# Patient Record
Sex: Female | Born: 1950 | Race: White | Hispanic: No | Smoking: Former smoker
Health system: Southern US, Community
[De-identification: ages and names within clinical notes are randomized; demographics above are authoritative.]

## PROBLEM LIST (undated history)

## (undated) DIAGNOSIS — H579 Unspecified disorder of eye and adnexa: Secondary | ICD-10-CM

## (undated) DIAGNOSIS — R011 Cardiac murmur, unspecified: Secondary | ICD-10-CM

## (undated) DIAGNOSIS — M2460 Ankylosis, unspecified joint: Secondary | ICD-10-CM

## (undated) DIAGNOSIS — C801 Malignant (primary) neoplasm, unspecified: Secondary | ICD-10-CM

## (undated) DIAGNOSIS — IMO0001 Reserved for inherently not codable concepts without codable children: Secondary | ICD-10-CM

## (undated) DIAGNOSIS — E669 Obesity, unspecified: Secondary | ICD-10-CM

## (undated) DIAGNOSIS — E119 Type 2 diabetes mellitus without complications: Secondary | ICD-10-CM

## (undated) DIAGNOSIS — L259 Unspecified contact dermatitis, unspecified cause: Secondary | ICD-10-CM

## (undated) DIAGNOSIS — Z794 Long term (current) use of insulin: Secondary | ICD-10-CM

## (undated) DIAGNOSIS — G709 Myoneural disorder, unspecified: Secondary | ICD-10-CM

## (undated) DIAGNOSIS — L0390 Cellulitis, unspecified: Secondary | ICD-10-CM

## (undated) DIAGNOSIS — I341 Nonrheumatic mitral (valve) prolapse: Secondary | ICD-10-CM

## (undated) DIAGNOSIS — E785 Hyperlipidemia, unspecified: Secondary | ICD-10-CM

## (undated) DIAGNOSIS — I10 Essential (primary) hypertension: Secondary | ICD-10-CM

## (undated) DIAGNOSIS — K122 Cellulitis and abscess of mouth: Secondary | ICD-10-CM

## (undated) DIAGNOSIS — N200 Calculus of kidney: Secondary | ICD-10-CM

## (undated) HISTORY — DX: Calculus of kidney: N20.0

## (undated) HISTORY — PX: OTHER SURGICAL HISTORY: SHX169

## (undated) HISTORY — DX: Obesity, unspecified: E66.9

## (undated) HISTORY — DX: Reserved for inherently not codable concepts without codable children: IMO0001

## (undated) HISTORY — DX: Cellulitis, unspecified: L03.90

## (undated) HISTORY — DX: Long term (current) use of insulin: Z79.4

## (undated) HISTORY — DX: Hyperlipidemia, unspecified: E78.5

## (undated) HISTORY — DX: Type 2 diabetes mellitus without complications: E11.9

## (undated) HISTORY — DX: Unspecified disorder of eye and adnexa: H57.9

## (undated) HISTORY — DX: Cellulitis and abscess of mouth: K12.2

## (undated) HISTORY — DX: Essential (primary) hypertension: I10

## (undated) HISTORY — PX: LITHOTRIPSY: SUR834

## (undated) HISTORY — PX: EYE SURGERY: SHX253

## (undated) HISTORY — DX: Ankylosis, unspecified joint: M24.60

## (undated) HISTORY — PX: TOOTH EXTRACTION: SUR596

## (undated) HISTORY — DX: Unspecified contact dermatitis, unspecified cause: L25.9

## (undated) HISTORY — PX: REFRACTIVE SURGERY: SHX103

---

## 1997-09-21 ENCOUNTER — Ambulatory Visit (HOSPITAL_COMMUNITY): Admission: RE | Admit: 1997-09-21 | Discharge: 1997-09-21 | Payer: Self-pay | Admitting: Orthopedic Surgery

## 1997-09-23 ENCOUNTER — Encounter: Admission: RE | Admit: 1997-09-23 | Discharge: 1997-12-22 | Payer: Self-pay | Admitting: Orthopedic Surgery

## 1998-02-03 ENCOUNTER — Encounter: Admission: RE | Admit: 1998-02-03 | Discharge: 1998-05-04 | Payer: Self-pay | Admitting: *Deleted

## 1998-12-02 ENCOUNTER — Other Ambulatory Visit: Admission: RE | Admit: 1998-12-02 | Discharge: 1998-12-02 | Payer: Self-pay | Admitting: Family Medicine

## 2001-03-05 ENCOUNTER — Encounter: Admission: RE | Admit: 2001-03-05 | Discharge: 2001-03-05 | Payer: Self-pay | Admitting: Family Medicine

## 2001-03-05 ENCOUNTER — Encounter: Payer: Self-pay | Admitting: Family Medicine

## 2009-10-27 ENCOUNTER — Emergency Department (HOSPITAL_COMMUNITY): Admission: EM | Admit: 2009-10-27 | Discharge: 2009-10-27 | Payer: Self-pay | Admitting: Emergency Medicine

## 2009-12-21 ENCOUNTER — Encounter
Admission: RE | Admit: 2009-12-21 | Discharge: 2010-02-09 | Payer: Self-pay | Source: Home / Self Care | Attending: Orthopedic Surgery | Admitting: Orthopedic Surgery

## 2010-05-20 LAB — GLUCOSE, CAPILLARY: Glucose-Capillary: 258 mg/dL — ABNORMAL HIGH (ref 70–99)

## 2010-07-06 ENCOUNTER — Encounter: Payer: Self-pay | Admitting: Physician Assistant

## 2010-07-06 DIAGNOSIS — I10 Essential (primary) hypertension: Secondary | ICD-10-CM | POA: Insufficient documentation

## 2010-07-06 DIAGNOSIS — E1165 Type 2 diabetes mellitus with hyperglycemia: Secondary | ICD-10-CM | POA: Insufficient documentation

## 2010-07-06 DIAGNOSIS — E1136 Type 2 diabetes mellitus with diabetic cataract: Secondary | ICD-10-CM | POA: Insufficient documentation

## 2010-07-06 DIAGNOSIS — E119 Type 2 diabetes mellitus without complications: Secondary | ICD-10-CM

## 2010-07-06 DIAGNOSIS — IMO0002 Reserved for concepts with insufficient information to code with codable children: Secondary | ICD-10-CM | POA: Insufficient documentation

## 2010-07-06 LAB — HM DEXA SCAN: HM Dexa Scan: NORMAL

## 2010-08-04 ENCOUNTER — Encounter: Payer: Self-pay | Admitting: Physician Assistant

## 2010-10-11 ENCOUNTER — Encounter (INDEPENDENT_AMBULATORY_CARE_PROVIDER_SITE_OTHER): Payer: BC Managed Care – PPO | Admitting: Ophthalmology

## 2010-10-11 DIAGNOSIS — D3130 Benign neoplasm of unspecified choroid: Secondary | ICD-10-CM

## 2010-10-11 DIAGNOSIS — E11359 Type 2 diabetes mellitus with proliferative diabetic retinopathy without macular edema: Secondary | ICD-10-CM

## 2010-10-11 DIAGNOSIS — E11311 Type 2 diabetes mellitus with unspecified diabetic retinopathy with macular edema: Secondary | ICD-10-CM

## 2010-10-11 DIAGNOSIS — H43819 Vitreous degeneration, unspecified eye: Secondary | ICD-10-CM

## 2010-10-31 ENCOUNTER — Encounter (INDEPENDENT_AMBULATORY_CARE_PROVIDER_SITE_OTHER): Payer: BC Managed Care – PPO | Admitting: Ophthalmology

## 2010-10-31 DIAGNOSIS — H2510 Age-related nuclear cataract, unspecified eye: Secondary | ICD-10-CM

## 2010-10-31 DIAGNOSIS — E11359 Type 2 diabetes mellitus with proliferative diabetic retinopathy without macular edema: Secondary | ICD-10-CM

## 2010-10-31 DIAGNOSIS — H43819 Vitreous degeneration, unspecified eye: Secondary | ICD-10-CM

## 2010-10-31 DIAGNOSIS — E11311 Type 2 diabetes mellitus with unspecified diabetic retinopathy with macular edema: Secondary | ICD-10-CM

## 2010-11-10 ENCOUNTER — Encounter (INDEPENDENT_AMBULATORY_CARE_PROVIDER_SITE_OTHER): Payer: BC Managed Care – PPO | Admitting: Ophthalmology

## 2010-11-10 DIAGNOSIS — H3581 Retinal edema: Secondary | ICD-10-CM

## 2010-11-15 ENCOUNTER — Encounter (INDEPENDENT_AMBULATORY_CARE_PROVIDER_SITE_OTHER): Payer: BC Managed Care – PPO | Admitting: Ophthalmology

## 2010-11-15 DIAGNOSIS — E11311 Type 2 diabetes mellitus with unspecified diabetic retinopathy with macular edema: Secondary | ICD-10-CM

## 2010-11-15 DIAGNOSIS — E11359 Type 2 diabetes mellitus with proliferative diabetic retinopathy without macular edema: Secondary | ICD-10-CM

## 2010-11-15 DIAGNOSIS — H43819 Vitreous degeneration, unspecified eye: Secondary | ICD-10-CM

## 2010-11-28 ENCOUNTER — Encounter (INDEPENDENT_AMBULATORY_CARE_PROVIDER_SITE_OTHER): Payer: BC Managed Care – PPO | Admitting: Ophthalmology

## 2010-11-28 DIAGNOSIS — E11359 Type 2 diabetes mellitus with proliferative diabetic retinopathy without macular edema: Secondary | ICD-10-CM

## 2010-11-28 DIAGNOSIS — E11311 Type 2 diabetes mellitus with unspecified diabetic retinopathy with macular edema: Secondary | ICD-10-CM

## 2010-11-28 DIAGNOSIS — H43819 Vitreous degeneration, unspecified eye: Secondary | ICD-10-CM

## 2010-12-20 ENCOUNTER — Encounter (INDEPENDENT_AMBULATORY_CARE_PROVIDER_SITE_OTHER): Payer: BC Managed Care – PPO | Admitting: Ophthalmology

## 2010-12-20 DIAGNOSIS — H43819 Vitreous degeneration, unspecified eye: Secondary | ICD-10-CM

## 2010-12-20 DIAGNOSIS — E1039 Type 1 diabetes mellitus with other diabetic ophthalmic complication: Secondary | ICD-10-CM

## 2010-12-20 DIAGNOSIS — E11359 Type 2 diabetes mellitus with proliferative diabetic retinopathy without macular edema: Secondary | ICD-10-CM

## 2010-12-20 DIAGNOSIS — E11311 Type 2 diabetes mellitus with unspecified diabetic retinopathy with macular edema: Secondary | ICD-10-CM

## 2010-12-26 ENCOUNTER — Encounter (INDEPENDENT_AMBULATORY_CARE_PROVIDER_SITE_OTHER): Payer: BC Managed Care – PPO | Admitting: Ophthalmology

## 2010-12-26 DIAGNOSIS — D3130 Benign neoplasm of unspecified choroid: Secondary | ICD-10-CM

## 2010-12-26 DIAGNOSIS — E11319 Type 2 diabetes mellitus with unspecified diabetic retinopathy without macular edema: Secondary | ICD-10-CM

## 2010-12-26 DIAGNOSIS — H43819 Vitreous degeneration, unspecified eye: Secondary | ICD-10-CM

## 2010-12-26 DIAGNOSIS — H3581 Retinal edema: Secondary | ICD-10-CM

## 2010-12-26 DIAGNOSIS — E1039 Type 1 diabetes mellitus with other diabetic ophthalmic complication: Secondary | ICD-10-CM

## 2011-01-20 ENCOUNTER — Encounter (INDEPENDENT_AMBULATORY_CARE_PROVIDER_SITE_OTHER): Payer: BC Managed Care – PPO | Admitting: Ophthalmology

## 2011-01-20 DIAGNOSIS — E11359 Type 2 diabetes mellitus with proliferative diabetic retinopathy without macular edema: Secondary | ICD-10-CM

## 2011-01-20 DIAGNOSIS — H43819 Vitreous degeneration, unspecified eye: Secondary | ICD-10-CM

## 2011-01-20 DIAGNOSIS — E1165 Type 2 diabetes mellitus with hyperglycemia: Secondary | ICD-10-CM

## 2011-01-20 DIAGNOSIS — E1139 Type 2 diabetes mellitus with other diabetic ophthalmic complication: Secondary | ICD-10-CM

## 2011-01-24 ENCOUNTER — Encounter (INDEPENDENT_AMBULATORY_CARE_PROVIDER_SITE_OTHER): Payer: BC Managed Care – PPO | Admitting: Ophthalmology

## 2011-01-24 DIAGNOSIS — I10 Essential (primary) hypertension: Secondary | ICD-10-CM

## 2011-01-24 DIAGNOSIS — E1139 Type 2 diabetes mellitus with other diabetic ophthalmic complication: Secondary | ICD-10-CM

## 2011-01-24 DIAGNOSIS — E11311 Type 2 diabetes mellitus with unspecified diabetic retinopathy with macular edema: Secondary | ICD-10-CM

## 2011-01-24 DIAGNOSIS — E11359 Type 2 diabetes mellitus with proliferative diabetic retinopathy without macular edema: Secondary | ICD-10-CM

## 2011-02-17 ENCOUNTER — Encounter (INDEPENDENT_AMBULATORY_CARE_PROVIDER_SITE_OTHER): Payer: BC Managed Care – PPO | Admitting: Ophthalmology

## 2011-02-22 ENCOUNTER — Encounter (INDEPENDENT_AMBULATORY_CARE_PROVIDER_SITE_OTHER): Payer: BC Managed Care – PPO | Admitting: Ophthalmology

## 2011-02-22 DIAGNOSIS — H2510 Age-related nuclear cataract, unspecified eye: Secondary | ICD-10-CM

## 2011-02-22 DIAGNOSIS — E11311 Type 2 diabetes mellitus with unspecified diabetic retinopathy with macular edema: Secondary | ICD-10-CM

## 2011-02-22 DIAGNOSIS — H43819 Vitreous degeneration, unspecified eye: Secondary | ICD-10-CM

## 2011-02-22 DIAGNOSIS — D3130 Benign neoplasm of unspecified choroid: Secondary | ICD-10-CM

## 2011-02-22 DIAGNOSIS — I10 Essential (primary) hypertension: Secondary | ICD-10-CM

## 2011-02-22 DIAGNOSIS — H35039 Hypertensive retinopathy, unspecified eye: Secondary | ICD-10-CM

## 2011-02-22 DIAGNOSIS — E1139 Type 2 diabetes mellitus with other diabetic ophthalmic complication: Secondary | ICD-10-CM

## 2011-02-22 DIAGNOSIS — E11359 Type 2 diabetes mellitus with proliferative diabetic retinopathy without macular edema: Secondary | ICD-10-CM

## 2011-03-13 ENCOUNTER — Encounter (INDEPENDENT_AMBULATORY_CARE_PROVIDER_SITE_OTHER): Payer: BC Managed Care – PPO | Admitting: Ophthalmology

## 2011-03-22 ENCOUNTER — Encounter (INDEPENDENT_AMBULATORY_CARE_PROVIDER_SITE_OTHER): Payer: BC Managed Care – PPO | Admitting: Ophthalmology

## 2011-04-03 ENCOUNTER — Encounter (INDEPENDENT_AMBULATORY_CARE_PROVIDER_SITE_OTHER): Payer: BC Managed Care – PPO | Admitting: Ophthalmology

## 2011-04-03 DIAGNOSIS — H2510 Age-related nuclear cataract, unspecified eye: Secondary | ICD-10-CM

## 2011-04-03 DIAGNOSIS — E1139 Type 2 diabetes mellitus with other diabetic ophthalmic complication: Secondary | ICD-10-CM

## 2011-04-03 DIAGNOSIS — I10 Essential (primary) hypertension: Secondary | ICD-10-CM

## 2011-04-03 DIAGNOSIS — E11359 Type 2 diabetes mellitus with proliferative diabetic retinopathy without macular edema: Secondary | ICD-10-CM

## 2011-04-03 DIAGNOSIS — H35039 Hypertensive retinopathy, unspecified eye: Secondary | ICD-10-CM

## 2011-04-03 DIAGNOSIS — H43819 Vitreous degeneration, unspecified eye: Secondary | ICD-10-CM

## 2011-04-03 DIAGNOSIS — E11311 Type 2 diabetes mellitus with unspecified diabetic retinopathy with macular edema: Secondary | ICD-10-CM

## 2011-04-03 DIAGNOSIS — E1165 Type 2 diabetes mellitus with hyperglycemia: Secondary | ICD-10-CM

## 2011-04-12 ENCOUNTER — Encounter (INDEPENDENT_AMBULATORY_CARE_PROVIDER_SITE_OTHER): Payer: BC Managed Care – PPO | Admitting: Ophthalmology

## 2011-04-12 DIAGNOSIS — E1139 Type 2 diabetes mellitus with other diabetic ophthalmic complication: Secondary | ICD-10-CM

## 2011-04-12 DIAGNOSIS — H35039 Hypertensive retinopathy, unspecified eye: Secondary | ICD-10-CM

## 2011-04-12 DIAGNOSIS — E11311 Type 2 diabetes mellitus with unspecified diabetic retinopathy with macular edema: Secondary | ICD-10-CM

## 2011-04-12 DIAGNOSIS — I10 Essential (primary) hypertension: Secondary | ICD-10-CM

## 2011-04-12 DIAGNOSIS — H2510 Age-related nuclear cataract, unspecified eye: Secondary | ICD-10-CM

## 2011-04-12 DIAGNOSIS — H43819 Vitreous degeneration, unspecified eye: Secondary | ICD-10-CM

## 2011-04-12 DIAGNOSIS — E11359 Type 2 diabetes mellitus with proliferative diabetic retinopathy without macular edema: Secondary | ICD-10-CM

## 2011-04-19 IMAGING — CR DG HUMERUS 2V *R*
2 series · 2 of 2 positions shown · non-contrast
Comparison: None

CLINICAL DATA: Fall

RIGHT HUMERUS - 2+ VIEW

[view not recorded (1 of 2)]
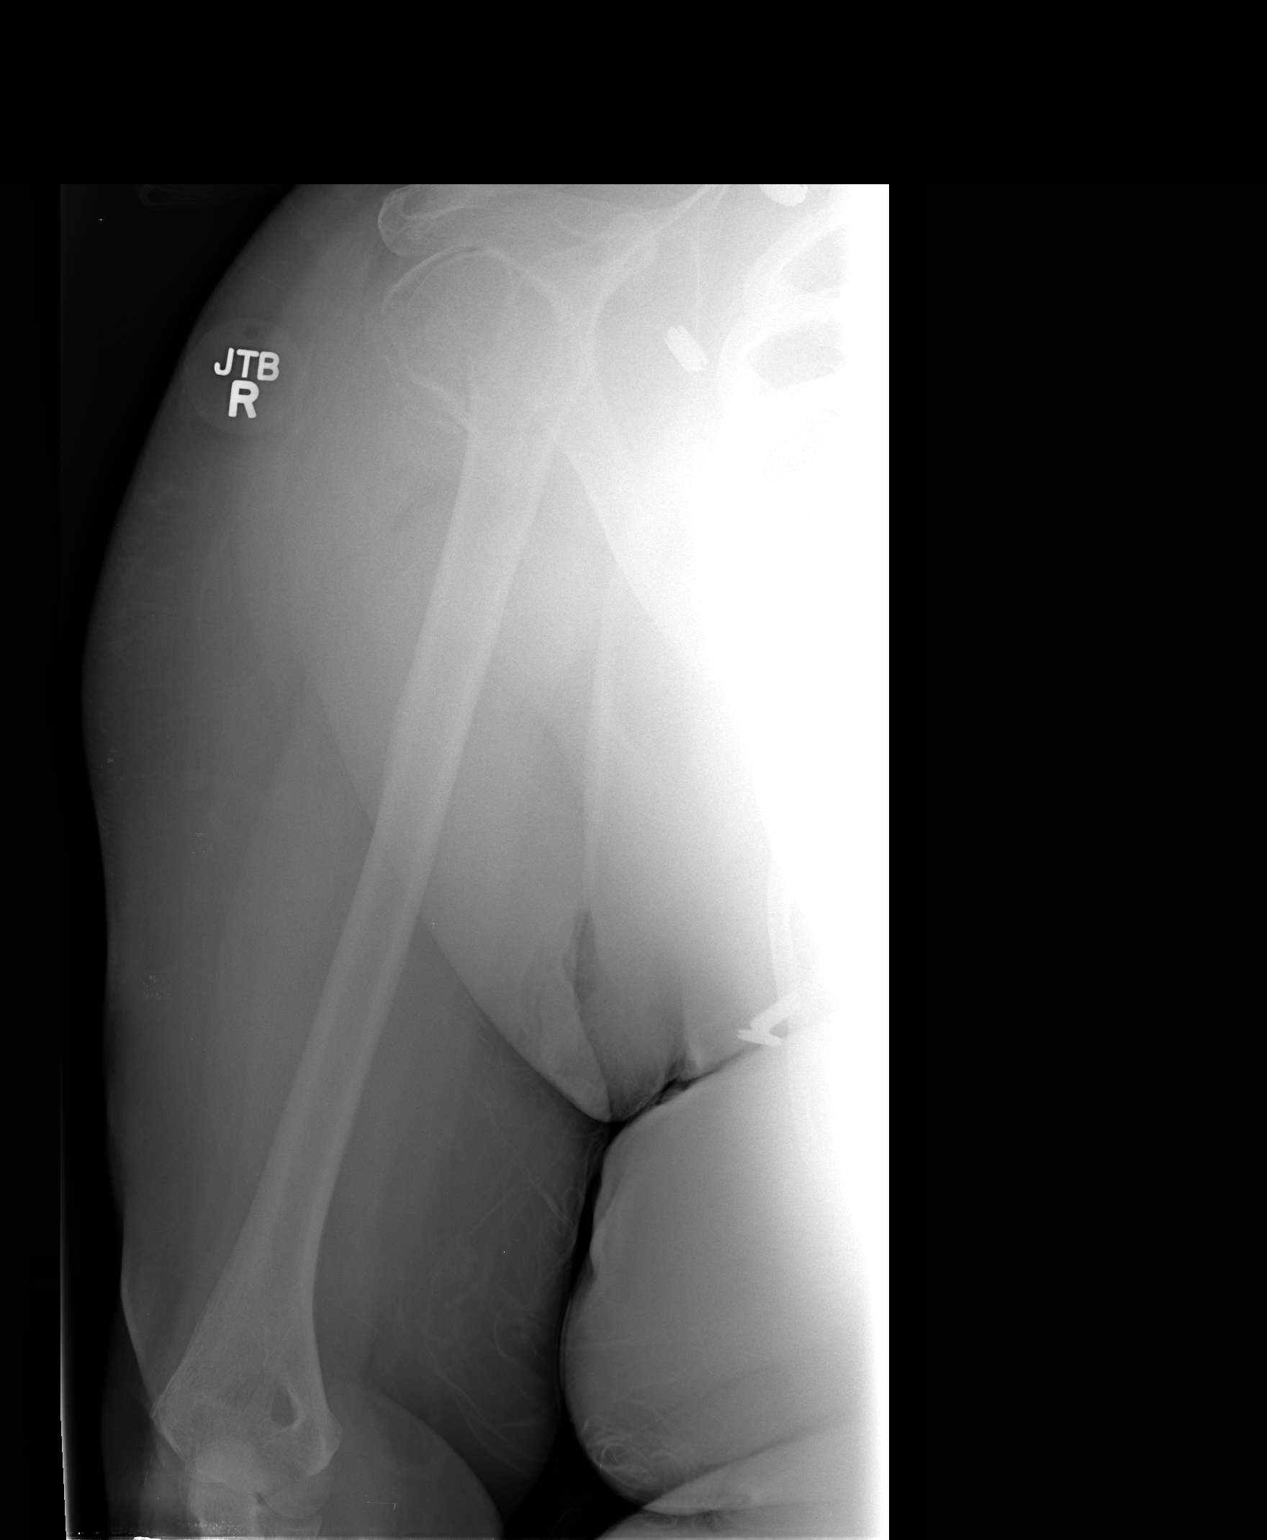

[view not recorded (2 of 2)]
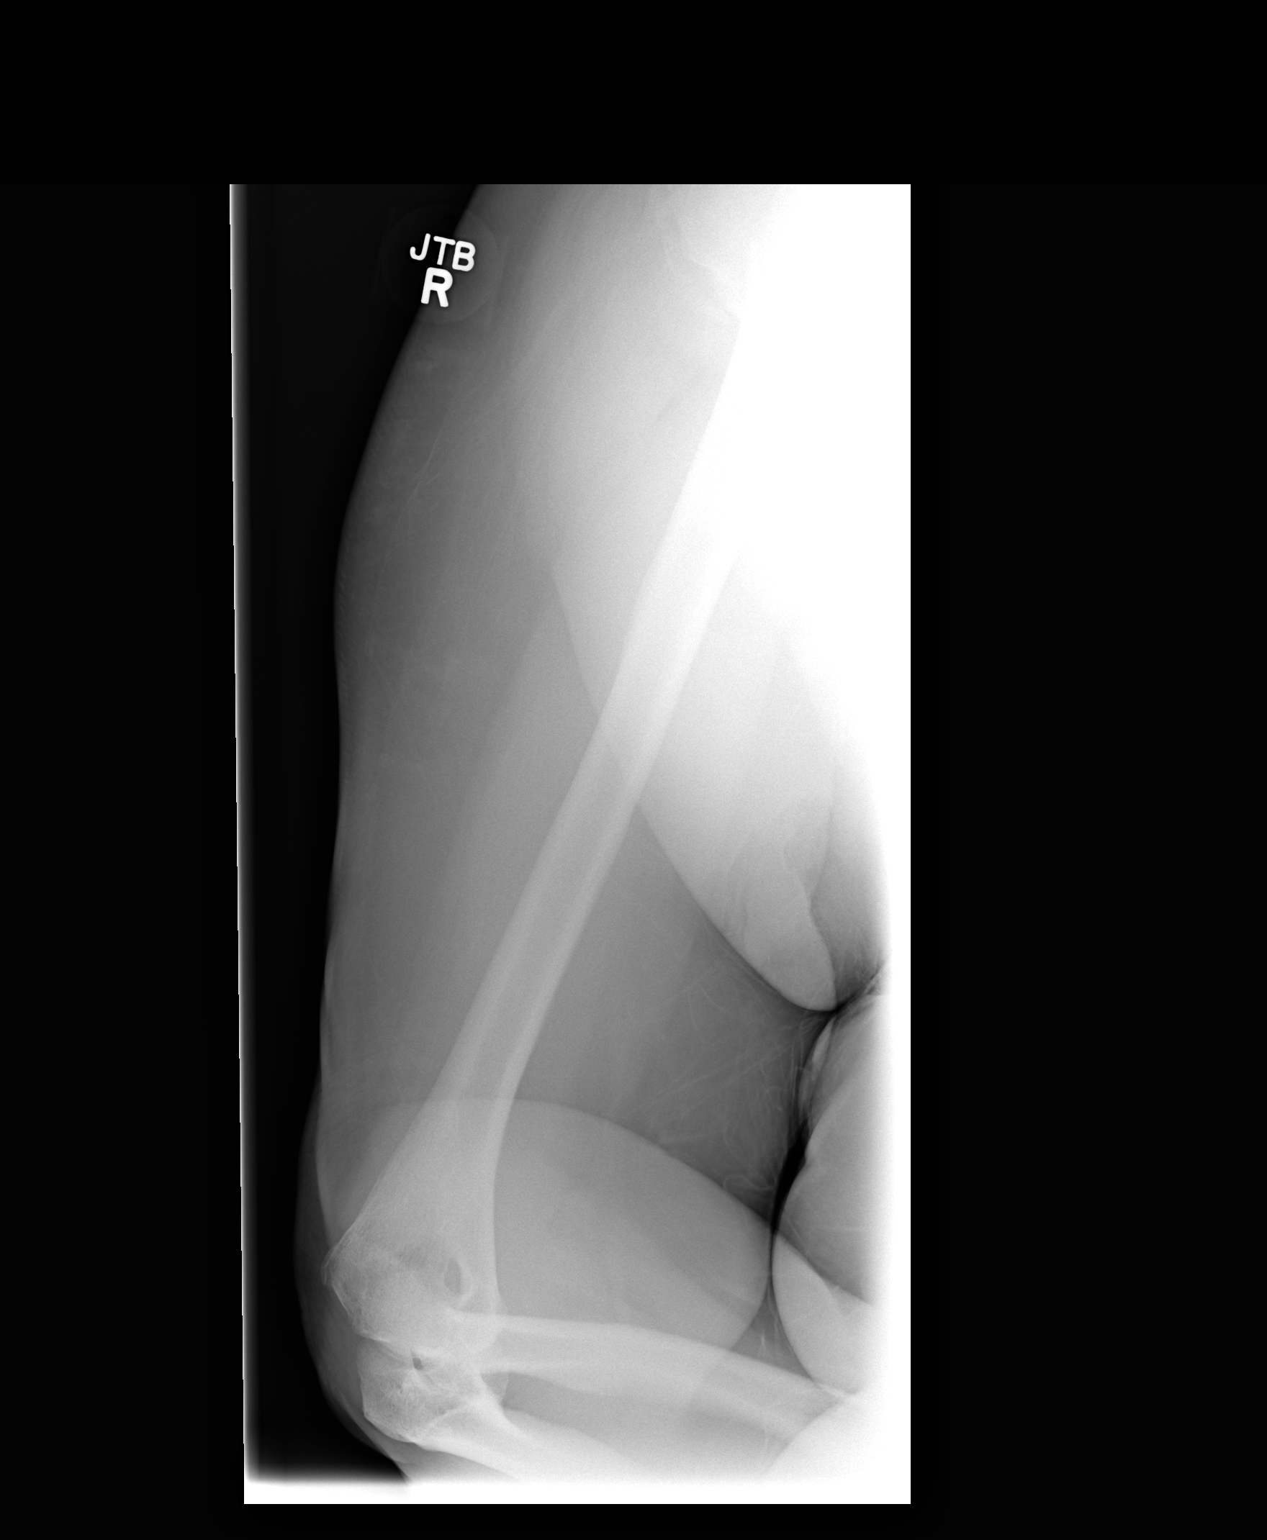

[2 of 2 positions shown; findings below may reference images not displayed]

FINDINGS: There is a fracture of the humeral neck with angulation
and mild displacement.  There is also a fracture of the humeral
head.  Limited views due to pain.  No dislocation.
IMPRESSION: Impacted and angulated fracture of the humeral neck.  There is also
fracture of the greater tuberosity of the humeral head.  Consider
CT for better anatomic delineation.

## 2011-05-01 ENCOUNTER — Encounter (INDEPENDENT_AMBULATORY_CARE_PROVIDER_SITE_OTHER): Payer: BC Managed Care – PPO | Admitting: Ophthalmology

## 2011-05-01 DIAGNOSIS — H35039 Hypertensive retinopathy, unspecified eye: Secondary | ICD-10-CM

## 2011-05-01 DIAGNOSIS — H43819 Vitreous degeneration, unspecified eye: Secondary | ICD-10-CM

## 2011-05-01 DIAGNOSIS — I10 Essential (primary) hypertension: Secondary | ICD-10-CM

## 2011-05-01 DIAGNOSIS — E1139 Type 2 diabetes mellitus with other diabetic ophthalmic complication: Secondary | ICD-10-CM

## 2011-05-01 DIAGNOSIS — E11311 Type 2 diabetes mellitus with unspecified diabetic retinopathy with macular edema: Secondary | ICD-10-CM

## 2011-05-01 DIAGNOSIS — E11359 Type 2 diabetes mellitus with proliferative diabetic retinopathy without macular edema: Secondary | ICD-10-CM

## 2011-05-10 ENCOUNTER — Encounter (INDEPENDENT_AMBULATORY_CARE_PROVIDER_SITE_OTHER): Payer: BC Managed Care – PPO | Admitting: Ophthalmology

## 2011-05-10 DIAGNOSIS — E1139 Type 2 diabetes mellitus with other diabetic ophthalmic complication: Secondary | ICD-10-CM

## 2011-05-10 DIAGNOSIS — H43819 Vitreous degeneration, unspecified eye: Secondary | ICD-10-CM

## 2011-05-10 DIAGNOSIS — D3130 Benign neoplasm of unspecified choroid: Secondary | ICD-10-CM

## 2011-05-10 DIAGNOSIS — E11311 Type 2 diabetes mellitus with unspecified diabetic retinopathy with macular edema: Secondary | ICD-10-CM

## 2011-05-10 DIAGNOSIS — E11359 Type 2 diabetes mellitus with proliferative diabetic retinopathy without macular edema: Secondary | ICD-10-CM

## 2011-05-10 DIAGNOSIS — I10 Essential (primary) hypertension: Secondary | ICD-10-CM

## 2011-05-10 DIAGNOSIS — H2510 Age-related nuclear cataract, unspecified eye: Secondary | ICD-10-CM

## 2011-05-10 DIAGNOSIS — H35039 Hypertensive retinopathy, unspecified eye: Secondary | ICD-10-CM

## 2011-05-29 ENCOUNTER — Encounter (INDEPENDENT_AMBULATORY_CARE_PROVIDER_SITE_OTHER): Payer: BC Managed Care – PPO | Admitting: Ophthalmology

## 2011-05-29 DIAGNOSIS — E1165 Type 2 diabetes mellitus with hyperglycemia: Secondary | ICD-10-CM

## 2011-05-29 DIAGNOSIS — E11311 Type 2 diabetes mellitus with unspecified diabetic retinopathy with macular edema: Secondary | ICD-10-CM

## 2011-05-29 DIAGNOSIS — I10 Essential (primary) hypertension: Secondary | ICD-10-CM

## 2011-05-29 DIAGNOSIS — E1139 Type 2 diabetes mellitus with other diabetic ophthalmic complication: Secondary | ICD-10-CM

## 2011-05-29 DIAGNOSIS — H2510 Age-related nuclear cataract, unspecified eye: Secondary | ICD-10-CM

## 2011-05-29 DIAGNOSIS — H35039 Hypertensive retinopathy, unspecified eye: Secondary | ICD-10-CM

## 2011-05-29 DIAGNOSIS — H43819 Vitreous degeneration, unspecified eye: Secondary | ICD-10-CM

## 2011-05-29 DIAGNOSIS — E11359 Type 2 diabetes mellitus with proliferative diabetic retinopathy without macular edema: Secondary | ICD-10-CM

## 2011-06-14 ENCOUNTER — Encounter (INDEPENDENT_AMBULATORY_CARE_PROVIDER_SITE_OTHER): Payer: BC Managed Care – PPO | Admitting: Ophthalmology

## 2011-06-14 DIAGNOSIS — H35039 Hypertensive retinopathy, unspecified eye: Secondary | ICD-10-CM

## 2011-06-14 DIAGNOSIS — E11311 Type 2 diabetes mellitus with unspecified diabetic retinopathy with macular edema: Secondary | ICD-10-CM

## 2011-06-14 DIAGNOSIS — H43819 Vitreous degeneration, unspecified eye: Secondary | ICD-10-CM

## 2011-06-14 DIAGNOSIS — E11359 Type 2 diabetes mellitus with proliferative diabetic retinopathy without macular edema: Secondary | ICD-10-CM

## 2011-06-14 DIAGNOSIS — E1139 Type 2 diabetes mellitus with other diabetic ophthalmic complication: Secondary | ICD-10-CM

## 2011-06-26 ENCOUNTER — Encounter (INDEPENDENT_AMBULATORY_CARE_PROVIDER_SITE_OTHER): Payer: BC Managed Care – PPO | Admitting: Ophthalmology

## 2011-06-30 ENCOUNTER — Encounter (INDEPENDENT_AMBULATORY_CARE_PROVIDER_SITE_OTHER): Payer: BC Managed Care – PPO | Admitting: Ophthalmology

## 2011-06-30 DIAGNOSIS — E1139 Type 2 diabetes mellitus with other diabetic ophthalmic complication: Secondary | ICD-10-CM

## 2011-06-30 DIAGNOSIS — H43819 Vitreous degeneration, unspecified eye: Secondary | ICD-10-CM

## 2011-06-30 DIAGNOSIS — E11311 Type 2 diabetes mellitus with unspecified diabetic retinopathy with macular edema: Secondary | ICD-10-CM

## 2011-06-30 DIAGNOSIS — E11359 Type 2 diabetes mellitus with proliferative diabetic retinopathy without macular edema: Secondary | ICD-10-CM

## 2011-06-30 DIAGNOSIS — I10 Essential (primary) hypertension: Secondary | ICD-10-CM

## 2011-06-30 DIAGNOSIS — E1165 Type 2 diabetes mellitus with hyperglycemia: Secondary | ICD-10-CM

## 2011-06-30 DIAGNOSIS — H35039 Hypertensive retinopathy, unspecified eye: Secondary | ICD-10-CM

## 2011-07-19 ENCOUNTER — Encounter (INDEPENDENT_AMBULATORY_CARE_PROVIDER_SITE_OTHER): Payer: BC Managed Care – PPO | Admitting: Ophthalmology

## 2011-07-19 DIAGNOSIS — E11311 Type 2 diabetes mellitus with unspecified diabetic retinopathy with macular edema: Secondary | ICD-10-CM

## 2011-07-19 DIAGNOSIS — E1139 Type 2 diabetes mellitus with other diabetic ophthalmic complication: Secondary | ICD-10-CM

## 2011-07-19 DIAGNOSIS — H2510 Age-related nuclear cataract, unspecified eye: Secondary | ICD-10-CM

## 2011-07-19 DIAGNOSIS — E11359 Type 2 diabetes mellitus with proliferative diabetic retinopathy without macular edema: Secondary | ICD-10-CM

## 2011-07-19 DIAGNOSIS — I10 Essential (primary) hypertension: Secondary | ICD-10-CM

## 2011-07-19 DIAGNOSIS — H35039 Hypertensive retinopathy, unspecified eye: Secondary | ICD-10-CM

## 2011-07-19 DIAGNOSIS — D3130 Benign neoplasm of unspecified choroid: Secondary | ICD-10-CM

## 2011-07-19 DIAGNOSIS — H43819 Vitreous degeneration, unspecified eye: Secondary | ICD-10-CM

## 2011-07-26 ENCOUNTER — Encounter (INDEPENDENT_AMBULATORY_CARE_PROVIDER_SITE_OTHER): Payer: BC Managed Care – PPO | Admitting: Ophthalmology

## 2011-07-26 DIAGNOSIS — H2510 Age-related nuclear cataract, unspecified eye: Secondary | ICD-10-CM

## 2011-07-26 DIAGNOSIS — H35039 Hypertensive retinopathy, unspecified eye: Secondary | ICD-10-CM

## 2011-07-26 DIAGNOSIS — E11311 Type 2 diabetes mellitus with unspecified diabetic retinopathy with macular edema: Secondary | ICD-10-CM

## 2011-07-26 DIAGNOSIS — E1139 Type 2 diabetes mellitus with other diabetic ophthalmic complication: Secondary | ICD-10-CM

## 2011-07-26 DIAGNOSIS — D3130 Benign neoplasm of unspecified choroid: Secondary | ICD-10-CM

## 2011-07-26 DIAGNOSIS — I10 Essential (primary) hypertension: Secondary | ICD-10-CM

## 2011-07-26 DIAGNOSIS — H43819 Vitreous degeneration, unspecified eye: Secondary | ICD-10-CM

## 2011-07-26 DIAGNOSIS — E11359 Type 2 diabetes mellitus with proliferative diabetic retinopathy without macular edema: Secondary | ICD-10-CM

## 2011-08-23 ENCOUNTER — Encounter (INDEPENDENT_AMBULATORY_CARE_PROVIDER_SITE_OTHER): Payer: BC Managed Care – PPO | Admitting: Ophthalmology

## 2011-08-30 ENCOUNTER — Encounter (INDEPENDENT_AMBULATORY_CARE_PROVIDER_SITE_OTHER): Payer: BC Managed Care – PPO | Admitting: Ophthalmology

## 2011-08-30 DIAGNOSIS — E1165 Type 2 diabetes mellitus with hyperglycemia: Secondary | ICD-10-CM

## 2011-08-30 DIAGNOSIS — E11311 Type 2 diabetes mellitus with unspecified diabetic retinopathy with macular edema: Secondary | ICD-10-CM

## 2011-08-30 DIAGNOSIS — E1139 Type 2 diabetes mellitus with other diabetic ophthalmic complication: Secondary | ICD-10-CM

## 2011-08-30 DIAGNOSIS — I10 Essential (primary) hypertension: Secondary | ICD-10-CM

## 2011-08-30 DIAGNOSIS — D3130 Benign neoplasm of unspecified choroid: Secondary | ICD-10-CM

## 2011-08-30 DIAGNOSIS — H43819 Vitreous degeneration, unspecified eye: Secondary | ICD-10-CM

## 2011-08-30 DIAGNOSIS — H35039 Hypertensive retinopathy, unspecified eye: Secondary | ICD-10-CM

## 2011-08-30 DIAGNOSIS — E11359 Type 2 diabetes mellitus with proliferative diabetic retinopathy without macular edema: Secondary | ICD-10-CM

## 2011-08-30 MED ORDER — LIDOCAINE HCL 2 % IJ SOLN
INTRAMUSCULAR | Status: AC
  Filled 2011-08-30: qty 1

## 2011-08-31 ENCOUNTER — Other Ambulatory Visit: Payer: Self-pay | Admitting: Obstetrics and Gynecology

## 2011-09-04 ENCOUNTER — Encounter (INDEPENDENT_AMBULATORY_CARE_PROVIDER_SITE_OTHER): Payer: BC Managed Care – PPO | Admitting: Ophthalmology

## 2011-09-04 DIAGNOSIS — E1139 Type 2 diabetes mellitus with other diabetic ophthalmic complication: Secondary | ICD-10-CM

## 2011-09-04 DIAGNOSIS — E1165 Type 2 diabetes mellitus with hyperglycemia: Secondary | ICD-10-CM

## 2011-09-04 DIAGNOSIS — I10 Essential (primary) hypertension: Secondary | ICD-10-CM

## 2011-09-04 DIAGNOSIS — H35039 Hypertensive retinopathy, unspecified eye: Secondary | ICD-10-CM

## 2011-09-04 DIAGNOSIS — E11311 Type 2 diabetes mellitus with unspecified diabetic retinopathy with macular edema: Secondary | ICD-10-CM

## 2011-09-04 DIAGNOSIS — H43819 Vitreous degeneration, unspecified eye: Secondary | ICD-10-CM

## 2011-09-04 DIAGNOSIS — H2510 Age-related nuclear cataract, unspecified eye: Secondary | ICD-10-CM

## 2011-09-04 DIAGNOSIS — D3130 Benign neoplasm of unspecified choroid: Secondary | ICD-10-CM

## 2011-09-04 DIAGNOSIS — E11359 Type 2 diabetes mellitus with proliferative diabetic retinopathy without macular edema: Secondary | ICD-10-CM

## 2011-09-10 ENCOUNTER — Encounter (HOSPITAL_COMMUNITY): Payer: Self-pay | Admitting: Pharmacist

## 2011-09-12 ENCOUNTER — Encounter (HOSPITAL_COMMUNITY): Payer: Self-pay

## 2011-09-12 ENCOUNTER — Encounter (HOSPITAL_COMMUNITY)
Admission: RE | Admit: 2011-09-12 | Discharge: 2011-09-12 | Disposition: A | Discharge status: Home / Self Care | Payer: BC Managed Care – PPO | Source: Ambulatory Visit | Attending: Obstetrics and Gynecology | Admitting: Obstetrics and Gynecology

## 2011-09-12 HISTORY — DX: Cardiac murmur, unspecified: R01.1

## 2011-09-12 HISTORY — DX: Myoneural disorder, unspecified: G70.9

## 2011-09-12 HISTORY — DX: Nonrheumatic mitral (valve) prolapse: I34.1

## 2011-09-12 HISTORY — DX: Malignant (primary) neoplasm, unspecified: C80.1

## 2011-09-12 LAB — CBC
HCT: 43.1 % (ref 36.0–46.0)
Hemoglobin: 14.2 g/dL (ref 12.0–15.0)
MCH: 26.9 pg (ref 26.0–34.0)
MCHC: 32.9 g/dL (ref 30.0–36.0)
MCV: 81.8 fL (ref 78.0–100.0)
Platelets: 217 10*3/uL (ref 150–400)
RBC: 5.27 MIL/uL — ABNORMAL HIGH (ref 3.87–5.11)
RDW: 13.6 % (ref 11.5–15.5)
WBC: 10.3 10*3/uL (ref 4.0–10.5)

## 2011-09-12 LAB — BASIC METABOLIC PANEL
BUN: 12 mg/dL (ref 6–23)
CO2: 29 mEq/L (ref 19–32)
Calcium: 9.2 mg/dL (ref 8.4–10.5)
Chloride: 96 mEq/L (ref 96–112)
Creatinine, Ser: 0.64 mg/dL (ref 0.50–1.10)
GFR calc Af Amer: >90 mL/min (ref >90)
GFR calc non Af Amer: >90 mL/min (ref >90)
Glucose, Bld: 242 mg/dL — ABNORMAL HIGH (ref 70–99)
Potassium: 4 mEq/L (ref 3.5–5.1)
Sodium: 134 mEq/L — ABNORMAL LOW (ref 135–145)

## 2011-09-12 NOTE — Pre-Procedure Instructions (Signed)
Dr Cristela Blue informed that patient's blood glucose level today was 242.  No orders given.

## 2011-09-12 NOTE — Patient Instructions (Signed)
   Your procedure is scheduled on: Wednesday, July 17th  Enter through the [employer] of [facility] at: 700am Pick up the phone at the desk and dial [phone] and inform us of your arrival.  Please call this number if you have any problems the morning of surgery: [phone]  Remember: Do not eat food after midnight: Tuesday Do not drink clear liquids after: Tuesday Take these medicines the morning of surgery with a SIP OF WATER:  Pt takes lisinipril at night - will take Tuesday night. Levemir - will take 1/2 50 units dose - 25 units Tuesday Night.  No Humalog dose on Wednesday Day of Surgery.  Do not wear jewelry, make-up, or FINGER nail polish No metal in your hair or on your body. Do not wear lotions, powders, perfumes or deodorant. Do not shave 48 hours prior to surgery. Do not bring valuables to the hospital. Contacts, dentures or bridgework may not be worn into surgery.  Patients discharged on the day of surgery will not be allowed to drive home.   Home with husband Greggory Stallion  cell [phone] or son Feliz Beam  cell [phone]   Remember to use your hibiclens as instructed.Please shower with 1/2 bottle the evening before your surgery and the other 1/2 bottle the morning of surgery. Neck down avoiding private area.

## 2011-09-14 ENCOUNTER — Other Ambulatory Visit: Payer: Self-pay | Admitting: Obstetrics and Gynecology

## 2011-09-14 DIAGNOSIS — R928 Other abnormal and inconclusive findings on diagnostic imaging of breast: Secondary | ICD-10-CM

## 2011-09-20 ENCOUNTER — Ambulatory Visit (HOSPITAL_COMMUNITY)
Admission: RE | Admit: 2011-09-20 | Discharge: 2011-09-20 | Disposition: A | Discharge status: Home / Self Care | Payer: BC Managed Care – PPO | Source: Ambulatory Visit | Attending: Obstetrics and Gynecology | Admitting: Obstetrics and Gynecology

## 2011-09-20 ENCOUNTER — Encounter (HOSPITAL_COMMUNITY): Payer: Self-pay | Admitting: Anesthesiology

## 2011-09-20 ENCOUNTER — Ambulatory Visit (HOSPITAL_COMMUNITY): Payer: BC Managed Care – PPO | Admitting: Anesthesiology

## 2011-09-20 ENCOUNTER — Encounter (HOSPITAL_COMMUNITY)
Admission: RE | Disposition: A | Discharge status: Home / Self Care | Payer: Self-pay | Source: Ambulatory Visit | Attending: Obstetrics and Gynecology

## 2011-09-20 DIAGNOSIS — Z01812 Encounter for preprocedural laboratory examination: Secondary | ICD-10-CM | POA: Insufficient documentation

## 2011-09-20 DIAGNOSIS — N950 Postmenopausal bleeding: Secondary | ICD-10-CM | POA: Insufficient documentation

## 2011-09-20 DIAGNOSIS — N840 Polyp of corpus uteri: Secondary | ICD-10-CM | POA: Insufficient documentation

## 2011-09-20 DIAGNOSIS — Z01818 Encounter for other preprocedural examination: Secondary | ICD-10-CM | POA: Insufficient documentation

## 2011-09-20 HISTORY — PX: HYSTEROSCOPY WITH D & C: SHX1775

## 2011-09-20 LAB — GLUCOSE, CAPILLARY: Glucose-Capillary: 268 mg/dL — ABNORMAL HIGH (ref 70–99)

## 2011-09-20 SURGERY — DILATATION AND CURETTAGE /HYSTEROSCOPY
Anesthesia: General | Site: Vagina | Wound class: Clean Contaminated

## 2011-09-20 MED ORDER — MIDAZOLAM HCL 2 MG/2ML IJ SOLN
INTRAMUSCULAR | Status: AC
  Filled 2011-09-20: qty 2

## 2011-09-20 MED ORDER — LIDOCAINE HCL (CARDIAC) 20 MG/ML IV SOLN
INTRAVENOUS | Status: AC
  Filled 2011-09-20: qty 5

## 2011-09-20 MED ORDER — PHENYLEPHRINE HCL 10 MG/ML IJ SOLN
INTRAMUSCULAR | Status: DC | PRN
  Administered 2011-09-20: 80 ug via INTRAVENOUS

## 2011-09-20 MED ORDER — PHENYLEPHRINE 40 MCG/ML (10ML) SYRINGE FOR IV PUSH (FOR BLOOD PRESSURE SUPPORT)
PREFILLED_SYRINGE | INTRAVENOUS | Status: AC
Start: 2011-09-20 — End: 2011-09-20
  Filled 2011-09-20: qty 5

## 2011-09-20 MED ORDER — INSULIN ASPART 100 UNIT/ML ~~LOC~~ SOLN
8.0000 [IU] | Freq: Once | SUBCUTANEOUS | Status: AC
  Administered 2011-09-20: 8 [IU] via SUBCUTANEOUS

## 2011-09-20 MED ORDER — ONDANSETRON HCL 4 MG/2ML IJ SOLN
INTRAMUSCULAR | Status: DC | PRN
  Administered 2011-09-20: 4 mg via INTRAVENOUS

## 2011-09-20 MED ORDER — INSULIN ASPART 100 UNIT/ML ~~LOC~~ SOLN
SUBCUTANEOUS | Status: AC
  Administered 2011-09-20: 8 [IU] via SUBCUTANEOUS
  Filled 2011-09-20: qty 1

## 2011-09-20 MED ORDER — FENTANYL CITRATE 0.05 MG/ML IJ SOLN
INTRAMUSCULAR | Status: AC
  Filled 2011-09-20: qty 2

## 2011-09-20 MED ORDER — FENTANYL CITRATE 0.05 MG/ML IJ SOLN
INTRAMUSCULAR | Status: DC | PRN
  Administered 2011-09-20 (×2): 50 ug via INTRAVENOUS

## 2011-09-20 MED ORDER — LACTATED RINGERS IV SOLN
INTRAVENOUS | Status: DC
  Administered 2011-09-20 (×2): via INTRAVENOUS

## 2011-09-20 MED ORDER — FENTANYL CITRATE 0.05 MG/ML IJ SOLN: 25.0000 ug | INTRAMUSCULAR | Status: DC | PRN

## 2011-09-20 MED ORDER — SILVER NITRATE-POT NITRATE 75-25 % EX MISC
CUTANEOUS | Status: AC
  Filled 2011-09-20: qty 2

## 2011-09-20 MED ORDER — ONDANSETRON HCL 4 MG/2ML IJ SOLN
INTRAMUSCULAR | Status: AC
Start: 2011-09-20 — End: 2011-09-20
  Filled 2011-09-20: qty 2

## 2011-09-20 MED ORDER — MIDAZOLAM HCL 5 MG/5ML IJ SOLN
INTRAMUSCULAR | Status: DC | PRN
  Administered 2011-09-20 (×4): 1 mg via INTRAVENOUS

## 2011-09-20 MED ORDER — GLYCINE 1.5 % IR SOLN
Status: DC | PRN
  Administered 2011-09-20: 3000 mL

## 2011-09-20 MED ORDER — LIDOCAINE-EPINEPHRINE (PF) 1 %-1:200000 IJ SOLN
INTRAMUSCULAR | Status: AC
  Filled 2011-09-20: qty 10

## 2011-09-20 MED ORDER — LIDOCAINE HCL 1 % IJ SOLN
INTRAMUSCULAR | Status: DC | PRN
  Administered 2011-09-20: 10 mL

## 2011-09-20 MED ORDER — ACETIC ACID 4% SOLUTION
Status: DC | PRN
  Administered 2011-09-20: 1 via TOPICAL

## 2011-09-20 MED ORDER — PROPOFOL 10 MG/ML IV EMUL
INTRAVENOUS | Status: AC
  Filled 2011-09-20: qty 20

## 2011-09-20 SURGICAL SUPPLY — 37 items
APPLICATOR COTTON TIP 6IN STRL (MISCELLANEOUS) ×3 IMPLANT
CANISTER SUCTION 2500CC (MISCELLANEOUS) ×3 IMPLANT
CATH ROBINSON RED A/P 16FR (CATHETERS) ×3 IMPLANT
CLOTH BEACON ORANGE TIMEOUT ST (SAFETY) ×3 IMPLANT
CONT SPECI 4OZ STER CLIK (MISCELLANEOUS) ×3 IMPLANT
CONTAINER PREFILL 10% NBF 60ML (FORM) ×6 IMPLANT
COUNTER NEEDLE 1200 MAGNETIC (NEEDLE) IMPLANT
ELECT BALL LEEP 5MM RED (ELECTRODE) IMPLANT
ELECT LOOP LEEP RND 15X12 GRN (CUTTING LOOP)
ELECT LOOP LEEP RND 20X12 WHT (CUTTING LOOP)
ELECT REM PT RETURN 9FT ADLT (ELECTROSURGICAL) ×3
ELECTRODE LOOP LP RND 15X12GRN (CUTTING LOOP) IMPLANT
ELECTRODE LOOP LP RND 20X12WHT (CUTTING LOOP) IMPLANT
ELECTRODE REM PT RTRN 9FT ADLT (ELECTROSURGICAL) ×2 IMPLANT
EVACUATOR PREFILTER SMOKE (MISCELLANEOUS) ×3 IMPLANT
GAUZE SPONGE 4X4 16PLY XRAY LF (GAUZE/BANDAGES/DRESSINGS) IMPLANT
GLOVE BIO SURGEON STRL SZ 6.5 (GLOVE) ×3 IMPLANT
GLOVE BIOGEL PI IND STRL 6.5 (GLOVE) ×4 IMPLANT
GLOVE BIOGEL PI INDICATOR 6.5 (GLOVE) ×2
GLOVE INDICATOR 6.5 STRL GRN (GLOVE) ×6 IMPLANT
GOWN PREVENTION PLUS LG XLONG (DISPOSABLE) ×6 IMPLANT
GOWN STRL REIN XL XLG (GOWN DISPOSABLE) ×3 IMPLANT
HOSE NS SMOKE EVAC 7/8 X6 (MISCELLANEOUS) ×3 IMPLANT
LOOP ANGLED CUTTING 22FR (CUTTING LOOP) IMPLANT
NEEDLE SPNL 22GX3.5 QUINCKE BK (NEEDLE) ×3 IMPLANT
NS IRRIG 1000ML POUR BTL (IV SOLUTION) ×3 IMPLANT
PACK HYSTEROSCOPY LF (CUSTOM PROCEDURE TRAY) ×3 IMPLANT
PACK VAGINAL MINOR WOMEN LF (CUSTOM PROCEDURE TRAY) ×3 IMPLANT
PENCIL BUTTON HOLSTER BLD 10FT (ELECTRODE) ×3 IMPLANT
REDUCER FITTING SMOKE EVAC (MISCELLANEOUS) ×3 IMPLANT
SCOPETTES 8  STERILE (MISCELLANEOUS) ×2
SCOPETTES 8 STERILE (MISCELLANEOUS) ×4 IMPLANT
SUT ETHILON 3 0 PS 1 18 (SUTURE) IMPLANT
SYR CONTROL 10ML LL (SYRINGE) ×3 IMPLANT
TOWEL OR 17X24 6PK STRL BLUE (TOWEL DISPOSABLE) ×6 IMPLANT
TUBING SMOKE EVAC HOSE ADAPTER (MISCELLANEOUS) ×3 IMPLANT
WATER STERILE IRR 1000ML POUR (IV SOLUTION) ×3 IMPLANT

## 2011-09-20 NOTE — H&P (Addendum)
61 y.o. presents with postmenopausal bleeding.  She has been PMP since 2005, but has had episodes of bleeding that have not been evaluated.  On office exam, lesion was noted on cervix, however Pap result was negative for abnormal cells and negative for HPV.  Pt elected to have colposcopy done in OR due to discomfort with office procedures.  Pt reports poor blood sugar control.  Increased risk of infection was discussed with patient as well as other risks of the procedure.  Past Medical History  Diagnosis Date  . Frozen joint     History of Right and Left Shoulder - no current problem 09/2011   . Hypertension   . IDDM (insulin dependent diabetes mellitus)   . Hyperlipidemia     no meds  . Contact dermatitis     History  . Obesity   . Cellulitis   . Oral cellulitis     History  . Eye problems     fluid in retina tx w/ avastin and lucentis drops, zymar abx drops   . Contact dermatitis   . Kidney stone     kidney stone - surgery required  . MVP (mitral valve prolapse)     abx tx with dental procedures  . Heart murmur     no problem  . Neuromuscular disorder     facial ride side decrease sensation  . Cancer     skin cancer hands/arms   Past Surgical History  Procedure Date  . Cesarean section 1987    x 1  . Lithotripsy     kidney stone  . Right side wisdom teeth ext     still has left wisdom teeth top/botom  . Tooth extraction     upper and lower back right  . Eye surgery     weak muscle eye surgery as child  . Refractive surgery     broken blood vessels behind eyes - bilaterally    History   Social History  . Marital Status: Married    Spouse Name: N/A    Number of Children: N/A  . Years of Education: N/A   Occupational History  . Not on file.   Social History Main Topics  . Smoking status: Never Smoker   . Smokeless tobacco: Never Used  . Alcohol Use: Yes     rarely - wine  . Drug Use: No  . Sexually Active: Yes    Birth Control/ Protection: None   Other  Topics Concern  . Not on file   Social History Narrative  . No narrative on file    No current facility-administered medications on file prior to encounter.   Current Outpatient Prescriptions on File Prior to Encounter  Medication Sig Dispense Refill  . fexofenadine (ALLEGRA) 180 MG tablet Take 180 mg by mouth daily as needed. For allergies      . insulin detemir (LEVEMIR) 100 UNIT/ML injection Inject 50 Units into the skin 2 (two) times daily.       . insulin lispro (HUMALOG) 100 UNIT/ML injection Inject 8-20 Units into the skin 3 (three) times daily before meals. Sliding scale: 70-110 0 units; 111-160 6 units; 161-220 12 units; 221-280 16 units; 281-340 20 units; 341-400 24 units; 401-460 28 units; 461-520 30 units      . lisinopril (PRINIVIL,ZESTRIL) 20 MG tablet Take 40 mg by mouth daily.         Allergies  Allergen Reactions  . Amlodipine Other (See Comments)    edema    @  [id number]@  Lungs: clear to ascultation Cor:  RRR Abdomen:  soft, nontender, nondistended. Ex:  no cords, erythema Pelvic:  Deferred to OR A:  AGUS on pap   P:  D&C hysteroscopy with colposcopy.   All risks, benefits and alternatives d/w patient and she desires to proceed with procedure.   Philip Aspen

## 2011-09-20 NOTE — Transfer of Care (Addendum)
Immediate Anesthesia Transfer of Care Note  Patient: Emma Griffin  Procedure(s) Performed: Procedure(s) (LRB): DILATATION AND CURETTAGE /HYSTEROSCOPY (N/A)  Patient Location: PACU  Anesthesia Type: MAC  Level of Consciousness: awake and sedated  Airway & Oxygen Therapy: Patient Spontanous Breathing  Post-op Assessment: Report given to PACU RN  Post vital signs: Reviewed and stable  Complications: No apparent anesthesia complications

## 2011-09-20 NOTE — Transfer of Care (Deleted)
Immediate Anesthesia Transfer of Care Note  Patient: Emma Griffin  Procedure(s) Performed: Procedure(s) (LRB): DILATATION AND CURETTAGE /HYSTEROSCOPY (N/A)  Patient Location: PACU  Anesthesia Type: Regional  Level of Consciousness: awake  Airway & Oxygen Therapy: Patient Spontanous Breathing  Post-op Assessment: Report given to PACU RN  Post vital signs: Reviewed and stable  Complications: No apparent anesthesia complications

## 2011-09-20 NOTE — Op Note (Signed)
Pre-op dx: postmenopausal bleeding, lesion on cervix on office exam Postop dx: postmenopausal bleeding, no visible cervical lesion Procedure: D&C, hysteroscopy, polypectomy Surgeon: Delray Alt Anesthesia: spinal with IV sedation, 10 cc 1% lidocaine pericervical block EBL: minimal UOP: 100cc clear cath'd preop Fluids: 1200cc Findings: uterus sounded to 9cm, abundant polypoid tissue, otherwise normal appearing uterine cavity Specimen: EMC, polypoid tissue Complications: none Condition: stable to PACU

## 2011-09-20 NOTE — Anesthesia Preprocedure Evaluation (Signed)
Anesthesia Evaluation  Patient identified by MRN, date of birth, ID band Patient awake    Reviewed: Allergy & Precautions, H&P , Patient's Chart, lab work & pertinent test results, reviewed documented beta blocker date and time   Airway Mallampati: IV TM Distance: >3 FB Neck ROM: full    Dental No notable dental hx. (+)    Pulmonary  breath sounds clear to auscultation  Pulmonary exam normal       Cardiovascular hypertension, On Medications Rhythm:regular Rate:Normal     Neuro/Psych    GI/Hepatic   Endo/Other  Poorly Controlled, Type obesity  Renal/GU      Musculoskeletal   Abdominal   Peds  Hematology   Anesthesia Other Findings Possible DI, plan spinal. Labs okay  Reproductive/Obstetrics                           Anesthesia Physical Anesthesia Plan  ASA: III  Anesthesia Plan: General and Spinal   Post-op Pain Management:    Induction: Intravenous  Airway Management Planned: LMA and Natural Airway  Additional Equipment:   Intra-op Plan:   Post-operative Plan:   Informed Consent: I have reviewed the patients History and Physical, chart, labs and discussed the procedure including the risks, benefits and alternatives for the proposed anesthesia with the patient or authorized representative who has indicated his/her understanding and acceptance.   Dental Advisory Given  Plan Discussed with: CRNA and Surgeon  Anesthesia Plan Comments: (  Discussed  Regional and general anesthesia, including possible nausea, instrumentation of airway, sore throat,pulmonary aspiration, etc. I asked if the were any outstanding questions, or  concerns before we proceeded.  Discussed spinal risk and HA,BA, failed block and Nerve injury. )        Anesthesia Quick Evaluation

## 2011-09-20 NOTE — Anesthesia Postprocedure Evaluation (Signed)
Anesthesia Post Note  Patient: Emma Griffin  Procedure(s) Performed: Procedure(s) (LRB): DILATATION AND CURETTAGE /HYSTEROSCOPY (N/A)  Anesthesia type: Spinal  Patient location: PACU  Post pain: Pain level controlled  Post assessment: Post-op Vital signs reviewed  Last Vitals:  Filed Vitals:   09/20/11 1300  BP:   Pulse: 81  Temp:   Resp: 16    Post vital signs: Reviewed  Level of consciousness: awake  Complications: No apparent anesthesia complications

## 2011-09-20 NOTE — Anesthesia Procedure Notes (Signed)
Spinal  Patient location during procedure: OR Preanesthetic Checklist Completed: patient identified, site marked, surgical consent, pre-op evaluation, timeout performed, IV checked, risks and benefits discussed and monitors and equipment checked Spinal Block Patient position: sitting Prep: DuraPrep Patient monitoring: heart rate, cardiac monitor, continuous pulse ox and blood pressure Approach: midline Location: L3-4 Injection technique: single-shot Needle Needle type: Sprotte  Needle gauge: 24 G Needle length: 9 cm Assessment Sensory level: T6 Additional Notes Spinal Dosage in OR  Bupivicaine ml       1.3   

## 2011-09-21 ENCOUNTER — Encounter (HOSPITAL_COMMUNITY): Payer: Self-pay | Admitting: Obstetrics and Gynecology

## 2011-09-21 LAB — GLUCOSE, CAPILLARY
Glucose-Capillary: 294 mg/dL — ABNORMAL HIGH (ref 70–99)
Glucose-Capillary: 226 mg/dL — ABNORMAL HIGH (ref 70–99)

## 2011-09-21 NOTE — Op Note (Signed)
Emma Griffin, Emma Griffin               ACCOUNT NO.:  [id number]  MEDICAL RECORD NO.:  [id number]  LOCATION:  [facility]                          FACILITY:  [facility]  PHYSICIAN:  Philip Aspen, DO    DATE OF BIRTH:  12-08-1950  DATE OF PROCEDURE:  09/20/2011 DATE OF DISCHARGE:  09/20/2011                              OPERATIVE REPORT   PREOPERATIVE DIAGNOSES:  Cervical lesion, postmenopausal bleeding.  POSTOPERATIVE DIAGNOSES:  Postmenopausal bleeding, normal-appearing cervix.  PROCEDURE:  D and C, hysteroscopy, colposcopy.  SURGEON:  Philip Aspen, DO.  URINE OUTPUT:  100 mL, clear urine, cath'd preoperatively.  FLUIDS:  1200 mL.  ESTIMATED BLOOD LOSS:  Minimal.  FINDINGS:  Uterus sounded to 9 cm.  Abundant polypoid tissue in the endometrial cavity.  Otherwise, normal-appearing uterine endometrium.  SPECIMENS:  Endometrial curettage.  COMPLICATIONS:  None.  CONDITION:  Stable to PACU.  DESCRIPTION OF PROCEDURE:  The patient was taken to the operating room, where epidural anesthesia was administered along with IV sedation and the patient was found to be comfortable.  The patient was prepped and draped in the normal sterile fashion in the dorsal lithotomy position. A speculum was placed in the vagina and single-toothed tenaculum placed at anterior lip of the cervix.  The cervical lesion visualized in the office and no longer apparent. Cervix did appear normal with small nabothian cyst at approximately 5 o'clock.  10 mL of 1% lidocaine were placed at 4 and 8 o'clock for paracervical block.  The speculum was removed and a weighted speculum placed in posterior aspect of the vagina.  The uterus was sounded to approximately 9 cm and the cervix was serially dilated to 23 Pratt.  The hysteroscope was introduced without difficulty and abundant polypoid tissue was noted.  Both uterine ostia were visualized.  The hysteroscope was removed and the patient was further dilated to 31 Pratt.  A  curette was attempted to removed the polypoid tissue.  After gentle curettage of all 4 quadrants, a polyp forceps was inserted into the endometrial cavity, and 1 large and 2 smaller polyps were removed without difficulty.  An additional pass of uterine curettage was performed, and all specimens were sent to pathology.  The hysteroscope was then reintroduced and the endometrial cavity was found to be devoid of any further polypoid tissue.  The hysteroscope was removed, and acetic acid was applied with no lesions visualized on colposcopy.  All instruments were removed from the vagina.  Tenaculum sites were found to be hemostatic.  No further bleeding from the cervical os was noted.  The patient tolerated the procedure well.  Sponge, lap, and needle counts were correct x2.  The patient was taken to recovery in stable condition.          ______________________________ Philip Aspen, DO     [place]/[facility]  D:  09/20/2011  T:  09/21/2011  Job:  [id number]

## 2011-09-22 ENCOUNTER — Encounter (INDEPENDENT_AMBULATORY_CARE_PROVIDER_SITE_OTHER): Payer: BC Managed Care – PPO | Admitting: Ophthalmology

## 2011-09-22 DIAGNOSIS — H43819 Vitreous degeneration, unspecified eye: Secondary | ICD-10-CM

## 2011-09-22 DIAGNOSIS — E11311 Type 2 diabetes mellitus with unspecified diabetic retinopathy with macular edema: Secondary | ICD-10-CM

## 2011-09-22 DIAGNOSIS — E1139 Type 2 diabetes mellitus with other diabetic ophthalmic complication: Secondary | ICD-10-CM

## 2011-09-22 DIAGNOSIS — H2510 Age-related nuclear cataract, unspecified eye: Secondary | ICD-10-CM

## 2011-09-22 DIAGNOSIS — I10 Essential (primary) hypertension: Secondary | ICD-10-CM

## 2011-09-22 DIAGNOSIS — H35039 Hypertensive retinopathy, unspecified eye: Secondary | ICD-10-CM

## 2011-09-22 DIAGNOSIS — D3130 Benign neoplasm of unspecified choroid: Secondary | ICD-10-CM

## 2011-09-25 ENCOUNTER — Other Ambulatory Visit: Payer: BC Managed Care – PPO

## 2011-10-02 ENCOUNTER — Other Ambulatory Visit: Payer: BC Managed Care – PPO

## 2011-10-03 ENCOUNTER — Ambulatory Visit
Admission: RE | Admit: 2011-10-03 | Discharge: 2011-10-03 | Disposition: A | Discharge status: Home / Self Care | Payer: BC Managed Care – PPO | Source: Ambulatory Visit | Attending: Obstetrics and Gynecology | Admitting: Obstetrics and Gynecology

## 2011-10-03 DIAGNOSIS — R928 Other abnormal and inconclusive findings on diagnostic imaging of breast: Secondary | ICD-10-CM

## 2011-10-09 ENCOUNTER — Encounter (INDEPENDENT_AMBULATORY_CARE_PROVIDER_SITE_OTHER): Payer: BC Managed Care – PPO | Admitting: Ophthalmology

## 2011-10-20 ENCOUNTER — Encounter (INDEPENDENT_AMBULATORY_CARE_PROVIDER_SITE_OTHER): Payer: BC Managed Care – PPO | Admitting: Ophthalmology

## 2011-10-20 DIAGNOSIS — I10 Essential (primary) hypertension: Secondary | ICD-10-CM

## 2011-10-20 DIAGNOSIS — E1165 Type 2 diabetes mellitus with hyperglycemia: Secondary | ICD-10-CM

## 2011-10-20 DIAGNOSIS — H43819 Vitreous degeneration, unspecified eye: Secondary | ICD-10-CM

## 2011-10-20 DIAGNOSIS — E1139 Type 2 diabetes mellitus with other diabetic ophthalmic complication: Secondary | ICD-10-CM

## 2011-10-20 DIAGNOSIS — D3130 Benign neoplasm of unspecified choroid: Secondary | ICD-10-CM

## 2011-10-20 DIAGNOSIS — E11311 Type 2 diabetes mellitus with unspecified diabetic retinopathy with macular edema: Secondary | ICD-10-CM

## 2011-10-20 DIAGNOSIS — H35039 Hypertensive retinopathy, unspecified eye: Secondary | ICD-10-CM

## 2011-10-20 DIAGNOSIS — E11359 Type 2 diabetes mellitus with proliferative diabetic retinopathy without macular edema: Secondary | ICD-10-CM

## 2011-10-20 DIAGNOSIS — H2510 Age-related nuclear cataract, unspecified eye: Secondary | ICD-10-CM

## 2011-10-26 ENCOUNTER — Encounter (INDEPENDENT_AMBULATORY_CARE_PROVIDER_SITE_OTHER): Payer: BC Managed Care – PPO | Admitting: Ophthalmology

## 2011-10-26 DIAGNOSIS — I10 Essential (primary) hypertension: Secondary | ICD-10-CM

## 2011-10-26 DIAGNOSIS — H43819 Vitreous degeneration, unspecified eye: Secondary | ICD-10-CM

## 2011-10-26 DIAGNOSIS — H35039 Hypertensive retinopathy, unspecified eye: Secondary | ICD-10-CM

## 2011-10-26 DIAGNOSIS — E1139 Type 2 diabetes mellitus with other diabetic ophthalmic complication: Secondary | ICD-10-CM

## 2011-10-26 DIAGNOSIS — E1165 Type 2 diabetes mellitus with hyperglycemia: Secondary | ICD-10-CM

## 2011-10-26 DIAGNOSIS — E11311 Type 2 diabetes mellitus with unspecified diabetic retinopathy with macular edema: Secondary | ICD-10-CM

## 2011-10-26 DIAGNOSIS — H2510 Age-related nuclear cataract, unspecified eye: Secondary | ICD-10-CM

## 2011-10-26 DIAGNOSIS — E11359 Type 2 diabetes mellitus with proliferative diabetic retinopathy without macular edema: Secondary | ICD-10-CM

## 2011-10-26 DIAGNOSIS — D3130 Benign neoplasm of unspecified choroid: Secondary | ICD-10-CM

## 2011-11-17 ENCOUNTER — Encounter (INDEPENDENT_AMBULATORY_CARE_PROVIDER_SITE_OTHER): Payer: BC Managed Care – PPO | Admitting: Ophthalmology

## 2011-12-07 ENCOUNTER — Encounter (INDEPENDENT_AMBULATORY_CARE_PROVIDER_SITE_OTHER): Payer: BC Managed Care – PPO | Admitting: Ophthalmology

## 2011-12-07 DIAGNOSIS — E1139 Type 2 diabetes mellitus with other diabetic ophthalmic complication: Secondary | ICD-10-CM

## 2011-12-07 DIAGNOSIS — E11311 Type 2 diabetes mellitus with unspecified diabetic retinopathy with macular edema: Secondary | ICD-10-CM

## 2011-12-07 DIAGNOSIS — E1165 Type 2 diabetes mellitus with hyperglycemia: Secondary | ICD-10-CM

## 2011-12-15 ENCOUNTER — Encounter (INDEPENDENT_AMBULATORY_CARE_PROVIDER_SITE_OTHER): Payer: BC Managed Care – PPO | Admitting: Ophthalmology

## 2011-12-15 DIAGNOSIS — D3130 Benign neoplasm of unspecified choroid: Secondary | ICD-10-CM

## 2011-12-15 DIAGNOSIS — H35039 Hypertensive retinopathy, unspecified eye: Secondary | ICD-10-CM

## 2011-12-15 DIAGNOSIS — H43819 Vitreous degeneration, unspecified eye: Secondary | ICD-10-CM

## 2011-12-15 DIAGNOSIS — E1165 Type 2 diabetes mellitus with hyperglycemia: Secondary | ICD-10-CM

## 2011-12-15 DIAGNOSIS — E11311 Type 2 diabetes mellitus with unspecified diabetic retinopathy with macular edema: Secondary | ICD-10-CM

## 2011-12-15 DIAGNOSIS — E1139 Type 2 diabetes mellitus with other diabetic ophthalmic complication: Secondary | ICD-10-CM

## 2011-12-15 DIAGNOSIS — E11359 Type 2 diabetes mellitus with proliferative diabetic retinopathy without macular edema: Secondary | ICD-10-CM

## 2011-12-15 DIAGNOSIS — I10 Essential (primary) hypertension: Secondary | ICD-10-CM

## 2011-12-15 DIAGNOSIS — H2510 Age-related nuclear cataract, unspecified eye: Secondary | ICD-10-CM

## 2012-01-12 ENCOUNTER — Encounter (INDEPENDENT_AMBULATORY_CARE_PROVIDER_SITE_OTHER): Payer: BC Managed Care – PPO | Admitting: Ophthalmology

## 2012-01-12 DIAGNOSIS — E11311 Type 2 diabetes mellitus with unspecified diabetic retinopathy with macular edema: Secondary | ICD-10-CM

## 2012-01-12 DIAGNOSIS — H43819 Vitreous degeneration, unspecified eye: Secondary | ICD-10-CM

## 2012-01-12 DIAGNOSIS — H35039 Hypertensive retinopathy, unspecified eye: Secondary | ICD-10-CM

## 2012-01-12 DIAGNOSIS — E11359 Type 2 diabetes mellitus with proliferative diabetic retinopathy without macular edema: Secondary | ICD-10-CM

## 2012-01-12 DIAGNOSIS — E1139 Type 2 diabetes mellitus with other diabetic ophthalmic complication: Secondary | ICD-10-CM

## 2012-01-12 DIAGNOSIS — H2510 Age-related nuclear cataract, unspecified eye: Secondary | ICD-10-CM

## 2012-01-12 DIAGNOSIS — I10 Essential (primary) hypertension: Secondary | ICD-10-CM

## 2012-01-18 ENCOUNTER — Encounter (INDEPENDENT_AMBULATORY_CARE_PROVIDER_SITE_OTHER): Payer: BC Managed Care – PPO | Admitting: Ophthalmology

## 2012-01-18 DIAGNOSIS — D3130 Benign neoplasm of unspecified choroid: Secondary | ICD-10-CM

## 2012-01-18 DIAGNOSIS — H35039 Hypertensive retinopathy, unspecified eye: Secondary | ICD-10-CM

## 2012-01-18 DIAGNOSIS — H43819 Vitreous degeneration, unspecified eye: Secondary | ICD-10-CM

## 2012-01-18 DIAGNOSIS — E1139 Type 2 diabetes mellitus with other diabetic ophthalmic complication: Secondary | ICD-10-CM

## 2012-01-18 DIAGNOSIS — E11311 Type 2 diabetes mellitus with unspecified diabetic retinopathy with macular edema: Secondary | ICD-10-CM

## 2012-01-18 DIAGNOSIS — E11359 Type 2 diabetes mellitus with proliferative diabetic retinopathy without macular edema: Secondary | ICD-10-CM

## 2012-01-18 DIAGNOSIS — I10 Essential (primary) hypertension: Secondary | ICD-10-CM

## 2012-02-09 ENCOUNTER — Encounter (INDEPENDENT_AMBULATORY_CARE_PROVIDER_SITE_OTHER): Payer: BC Managed Care – PPO | Admitting: Ophthalmology

## 2012-02-09 DIAGNOSIS — H35039 Hypertensive retinopathy, unspecified eye: Secondary | ICD-10-CM

## 2012-02-09 DIAGNOSIS — D3130 Benign neoplasm of unspecified choroid: Secondary | ICD-10-CM

## 2012-02-09 DIAGNOSIS — E1165 Type 2 diabetes mellitus with hyperglycemia: Secondary | ICD-10-CM

## 2012-02-09 DIAGNOSIS — E11359 Type 2 diabetes mellitus with proliferative diabetic retinopathy without macular edema: Secondary | ICD-10-CM

## 2012-02-09 DIAGNOSIS — H2510 Age-related nuclear cataract, unspecified eye: Secondary | ICD-10-CM

## 2012-02-09 DIAGNOSIS — I10 Essential (primary) hypertension: Secondary | ICD-10-CM

## 2012-02-09 DIAGNOSIS — E1139 Type 2 diabetes mellitus with other diabetic ophthalmic complication: Secondary | ICD-10-CM

## 2012-02-09 DIAGNOSIS — H43819 Vitreous degeneration, unspecified eye: Secondary | ICD-10-CM

## 2012-02-09 DIAGNOSIS — E11311 Type 2 diabetes mellitus with unspecified diabetic retinopathy with macular edema: Secondary | ICD-10-CM

## 2012-03-07 ENCOUNTER — Encounter (INDEPENDENT_AMBULATORY_CARE_PROVIDER_SITE_OTHER): Payer: BC Managed Care – PPO | Admitting: Ophthalmology

## 2012-03-15 ENCOUNTER — Encounter (INDEPENDENT_AMBULATORY_CARE_PROVIDER_SITE_OTHER): Payer: BC Managed Care – PPO | Admitting: Ophthalmology

## 2012-03-15 DIAGNOSIS — D3130 Benign neoplasm of unspecified choroid: Secondary | ICD-10-CM

## 2012-03-15 DIAGNOSIS — H2510 Age-related nuclear cataract, unspecified eye: Secondary | ICD-10-CM

## 2012-03-15 DIAGNOSIS — I10 Essential (primary) hypertension: Secondary | ICD-10-CM

## 2012-03-15 DIAGNOSIS — E11359 Type 2 diabetes mellitus with proliferative diabetic retinopathy without macular edema: Secondary | ICD-10-CM

## 2012-03-15 DIAGNOSIS — E1139 Type 2 diabetes mellitus with other diabetic ophthalmic complication: Secondary | ICD-10-CM

## 2012-03-15 DIAGNOSIS — H43819 Vitreous degeneration, unspecified eye: Secondary | ICD-10-CM

## 2012-03-15 DIAGNOSIS — E1165 Type 2 diabetes mellitus with hyperglycemia: Secondary | ICD-10-CM

## 2012-03-15 DIAGNOSIS — E11311 Type 2 diabetes mellitus with unspecified diabetic retinopathy with macular edema: Secondary | ICD-10-CM

## 2012-03-15 DIAGNOSIS — H35039 Hypertensive retinopathy, unspecified eye: Secondary | ICD-10-CM

## 2012-04-16 ENCOUNTER — Encounter (INDEPENDENT_AMBULATORY_CARE_PROVIDER_SITE_OTHER): Payer: BC Managed Care – PPO | Admitting: Ophthalmology

## 2012-04-16 DIAGNOSIS — H35039 Hypertensive retinopathy, unspecified eye: Secondary | ICD-10-CM

## 2012-04-16 DIAGNOSIS — H2510 Age-related nuclear cataract, unspecified eye: Secondary | ICD-10-CM

## 2012-04-16 DIAGNOSIS — I10 Essential (primary) hypertension: Secondary | ICD-10-CM

## 2012-04-16 DIAGNOSIS — E11311 Type 2 diabetes mellitus with unspecified diabetic retinopathy with macular edema: Secondary | ICD-10-CM

## 2012-04-16 DIAGNOSIS — E11359 Type 2 diabetes mellitus with proliferative diabetic retinopathy without macular edema: Secondary | ICD-10-CM

## 2012-04-16 DIAGNOSIS — E1139 Type 2 diabetes mellitus with other diabetic ophthalmic complication: Secondary | ICD-10-CM

## 2012-04-16 DIAGNOSIS — H43819 Vitreous degeneration, unspecified eye: Secondary | ICD-10-CM

## 2012-04-18 ENCOUNTER — Encounter (INDEPENDENT_AMBULATORY_CARE_PROVIDER_SITE_OTHER): Payer: BC Managed Care – PPO | Admitting: Ophthalmology

## 2012-05-16 ENCOUNTER — Encounter (INDEPENDENT_AMBULATORY_CARE_PROVIDER_SITE_OTHER): Payer: BC Managed Care – PPO | Admitting: Ophthalmology

## 2012-05-16 DIAGNOSIS — D3130 Benign neoplasm of unspecified choroid: Secondary | ICD-10-CM

## 2012-05-16 DIAGNOSIS — H35039 Hypertensive retinopathy, unspecified eye: Secondary | ICD-10-CM

## 2012-05-16 DIAGNOSIS — E11311 Type 2 diabetes mellitus with unspecified diabetic retinopathy with macular edema: Secondary | ICD-10-CM

## 2012-05-16 DIAGNOSIS — I10 Essential (primary) hypertension: Secondary | ICD-10-CM

## 2012-05-16 DIAGNOSIS — H43819 Vitreous degeneration, unspecified eye: Secondary | ICD-10-CM

## 2012-05-16 DIAGNOSIS — E1139 Type 2 diabetes mellitus with other diabetic ophthalmic complication: Secondary | ICD-10-CM

## 2012-05-16 DIAGNOSIS — H2510 Age-related nuclear cataract, unspecified eye: Secondary | ICD-10-CM

## 2012-06-13 ENCOUNTER — Encounter (INDEPENDENT_AMBULATORY_CARE_PROVIDER_SITE_OTHER): Payer: BC Managed Care – PPO | Admitting: Ophthalmology

## 2012-06-13 DIAGNOSIS — I10 Essential (primary) hypertension: Secondary | ICD-10-CM

## 2012-06-13 DIAGNOSIS — E1139 Type 2 diabetes mellitus with other diabetic ophthalmic complication: Secondary | ICD-10-CM

## 2012-06-13 DIAGNOSIS — E11359 Type 2 diabetes mellitus with proliferative diabetic retinopathy without macular edema: Secondary | ICD-10-CM

## 2012-06-13 DIAGNOSIS — E11311 Type 2 diabetes mellitus with unspecified diabetic retinopathy with macular edema: Secondary | ICD-10-CM

## 2012-06-13 DIAGNOSIS — H35039 Hypertensive retinopathy, unspecified eye: Secondary | ICD-10-CM

## 2012-06-13 DIAGNOSIS — D3130 Benign neoplasm of unspecified choroid: Secondary | ICD-10-CM

## 2012-06-13 DIAGNOSIS — E1165 Type 2 diabetes mellitus with hyperglycemia: Secondary | ICD-10-CM

## 2012-06-24 ENCOUNTER — Other Ambulatory Visit: Payer: Self-pay

## 2012-06-24 MED ORDER — INSULIN DETEMIR 100 UNIT/ML ~~LOC~~ SOLN: SUBCUTANEOUS | Status: DC

## 2012-07-10 ENCOUNTER — Encounter (INDEPENDENT_AMBULATORY_CARE_PROVIDER_SITE_OTHER): Payer: BC Managed Care – PPO | Admitting: Ophthalmology

## 2012-07-10 DIAGNOSIS — I10 Essential (primary) hypertension: Secondary | ICD-10-CM

## 2012-07-10 DIAGNOSIS — H43819 Vitreous degeneration, unspecified eye: Secondary | ICD-10-CM

## 2012-07-10 DIAGNOSIS — E11311 Type 2 diabetes mellitus with unspecified diabetic retinopathy with macular edema: Secondary | ICD-10-CM

## 2012-07-10 DIAGNOSIS — H2510 Age-related nuclear cataract, unspecified eye: Secondary | ICD-10-CM

## 2012-07-10 DIAGNOSIS — H35039 Hypertensive retinopathy, unspecified eye: Secondary | ICD-10-CM

## 2012-07-10 DIAGNOSIS — E11359 Type 2 diabetes mellitus with proliferative diabetic retinopathy without macular edema: Secondary | ICD-10-CM

## 2012-07-10 DIAGNOSIS — D3130 Benign neoplasm of unspecified choroid: Secondary | ICD-10-CM

## 2012-07-10 DIAGNOSIS — E1139 Type 2 diabetes mellitus with other diabetic ophthalmic complication: Secondary | ICD-10-CM

## 2012-07-28 ENCOUNTER — Other Ambulatory Visit: Payer: Self-pay | Admitting: Family Medicine

## 2012-07-29 ENCOUNTER — Other Ambulatory Visit: Payer: Self-pay | Admitting: Nurse Practitioner

## 2012-07-30 NOTE — Telephone Encounter (Signed)
LAST OV 3/14. LAST AIC 04/10/11 9.4.

## 2012-08-21 ENCOUNTER — Encounter (INDEPENDENT_AMBULATORY_CARE_PROVIDER_SITE_OTHER): Payer: BC Managed Care – PPO | Admitting: Ophthalmology

## 2012-08-21 DIAGNOSIS — H43819 Vitreous degeneration, unspecified eye: Secondary | ICD-10-CM

## 2012-08-21 DIAGNOSIS — I10 Essential (primary) hypertension: Secondary | ICD-10-CM

## 2012-08-21 DIAGNOSIS — H2510 Age-related nuclear cataract, unspecified eye: Secondary | ICD-10-CM

## 2012-08-21 DIAGNOSIS — E1139 Type 2 diabetes mellitus with other diabetic ophthalmic complication: Secondary | ICD-10-CM

## 2012-08-21 DIAGNOSIS — D3130 Benign neoplasm of unspecified choroid: Secondary | ICD-10-CM

## 2012-08-21 DIAGNOSIS — E11311 Type 2 diabetes mellitus with unspecified diabetic retinopathy with macular edema: Secondary | ICD-10-CM

## 2012-08-21 DIAGNOSIS — H35039 Hypertensive retinopathy, unspecified eye: Secondary | ICD-10-CM

## 2012-08-21 DIAGNOSIS — E11359 Type 2 diabetes mellitus with proliferative diabetic retinopathy without macular edema: Secondary | ICD-10-CM

## 2012-09-20 ENCOUNTER — Telehealth: Payer: Self-pay | Admitting: *Deleted

## 2012-09-20 NOTE — Telephone Encounter (Signed)
humalog needed to be prior approved so she tried novolog, she said she felt terrible and her blood sugar stayed in high 200's and 300 range she wanted me to try to get her humalog approved and I did  the same dosage that Greig Castilla had given her on her last visit, it doesn't look like she has had any  labs in a pretty good while, I told her she needs to make an appt and see you soon as she said she wished to see you, her mother also sees you.

## 2012-09-20 NOTE — Telephone Encounter (Signed)
Ok thank you 

## 2012-09-25 ENCOUNTER — Encounter (INDEPENDENT_AMBULATORY_CARE_PROVIDER_SITE_OTHER): Payer: BC Managed Care – PPO | Admitting: Ophthalmology

## 2012-09-25 DIAGNOSIS — D3130 Benign neoplasm of unspecified choroid: Secondary | ICD-10-CM

## 2012-09-25 DIAGNOSIS — H43819 Vitreous degeneration, unspecified eye: Secondary | ICD-10-CM

## 2012-09-25 DIAGNOSIS — I10 Essential (primary) hypertension: Secondary | ICD-10-CM

## 2012-09-25 DIAGNOSIS — E1165 Type 2 diabetes mellitus with hyperglycemia: Secondary | ICD-10-CM

## 2012-09-25 DIAGNOSIS — E11311 Type 2 diabetes mellitus with unspecified diabetic retinopathy with macular edema: Secondary | ICD-10-CM

## 2012-09-25 DIAGNOSIS — E1139 Type 2 diabetes mellitus with other diabetic ophthalmic complication: Secondary | ICD-10-CM

## 2012-09-25 DIAGNOSIS — H35039 Hypertensive retinopathy, unspecified eye: Secondary | ICD-10-CM

## 2012-09-25 DIAGNOSIS — E11359 Type 2 diabetes mellitus with proliferative diabetic retinopathy without macular edema: Secondary | ICD-10-CM

## 2012-09-30 ENCOUNTER — Encounter: Payer: Self-pay | Admitting: Nurse Practitioner

## 2012-09-30 ENCOUNTER — Ambulatory Visit (INDEPENDENT_AMBULATORY_CARE_PROVIDER_SITE_OTHER): Payer: BC Managed Care – PPO | Admitting: Nurse Practitioner

## 2012-09-30 VITALS — BP 135/76 | HR 87 | Temp 97.4°F | Ht 65.5 in | Wt 264.0 lb

## 2012-09-30 DIAGNOSIS — E119 Type 2 diabetes mellitus without complications: Secondary | ICD-10-CM

## 2012-09-30 DIAGNOSIS — E1169 Type 2 diabetes mellitus with other specified complication: Secondary | ICD-10-CM | POA: Insufficient documentation

## 2012-09-30 DIAGNOSIS — E785 Hyperlipidemia, unspecified: Secondary | ICD-10-CM

## 2012-09-30 DIAGNOSIS — I10 Essential (primary) hypertension: Secondary | ICD-10-CM

## 2012-09-30 LAB — POCT GLYCOSYLATED HEMOGLOBIN (HGB A1C): Hemoglobin A1C: 10.1

## 2012-09-30 MED ORDER — INSULIN PEN NEEDLE 30G X 8 MM MISC: Status: DC

## 2012-09-30 MED ORDER — INSULIN DETEMIR 100 UNIT/ML FLEXPEN: 55.0000 [IU] | PEN_INJECTOR | Freq: Two times a day (BID) | SUBCUTANEOUS | Status: DC

## 2012-09-30 MED ORDER — INSULIN LISPRO 100 UNIT/ML ~~LOC~~ SOLN: 8.0000 [IU] | Freq: Three times a day (TID) | SUBCUTANEOUS | Status: DC

## 2012-09-30 MED ORDER — LISINOPRIL 20 MG PO TABS: 20.0000 mg | ORAL_TABLET | Freq: Two times a day (BID) | ORAL | Status: DC

## 2012-09-30 MED ORDER — FUROSEMIDE 40 MG PO TABS: 40.0000 mg | ORAL_TABLET | Freq: Every day | ORAL | Status: DC

## 2012-09-30 MED ORDER — FUROSEMIDE 20 MG PO TABS: 20.0000 mg | ORAL_TABLET | Freq: Every day | ORAL | Status: DC

## 2012-09-30 MED ORDER — INSULIN DETEMIR 100 UNIT/ML FLEXPEN: 60.0000 [IU] | PEN_INJECTOR | Freq: Two times a day (BID) | SUBCUTANEOUS | Status: DC

## 2012-09-30 MED ORDER — INSULIN LISPRO 100 UNIT/ML (KWIKPEN): PEN_INJECTOR | SUBCUTANEOUS | Status: DC

## 2012-09-30 NOTE — Addendum Note (Signed)
Addended by: Bennie Pierini on: 09/30/2012 01:27 PM   Modules accepted: Orders, Medications

## 2012-09-30 NOTE — Progress Notes (Signed)
Subjective:    Patient ID: Emma Griffin, female    DOB: Aug 13, 1950, 62 y.o.   MRN: [id number]  Diabetes She presents for her follow-up diabetic visit. She has type 2 diabetes mellitus. There are no hypoglycemic associated symptoms. Associated symptoms include fatigue. Pertinent negatives for diabetes include no blurred vision, no chest pain and no foot ulcerations. There are no hypoglycemic complications. There are no diabetic complications. Risk factors for coronary artery disease include diabetes mellitus, dyslipidemia, hypertension, obesity, post-menopausal and family history. Current diabetic treatment includes insulin injections. She is compliant with treatment all of the time. Her weight is stable. She is following a generally healthy diet. She has not had a previous visit with a dietician. Her breakfast blood glucose range is generally >200 mg/dl. An ACE inhibitor/angiotensin II receptor blocker is being taken. She does not see a podiatrist.Eye exam is current (every 4 weeks).  Hypertension This is a chronic problem. The current episode started more than 1 year ago. The problem has been resolved since onset. The problem is controlled. Associated symptoms include peripheral edema. Pertinent negatives include no blurred vision, chest pain, palpitations or shortness of breath. Risk factors for coronary artery disease include diabetes mellitus, dyslipidemia, family history, obesity and post-menopausal state. Past treatments include ACE inhibitors. The current treatment provides significant improvement. There is no history of a thyroid problem. There is no history of sleep apnea.  Hyperlipidemia This is a chronic problem. The current episode started more than 1 year ago. The problem is uncontrolled. Recent lipid tests were reviewed and are high. Exacerbating diseases include diabetes. She has no history of hypothyroidism. Pertinent negatives include no chest pain or shortness of breath. Current  antihyperlipidemic treatment includes herbal therapy. The current treatment provides no improvement of lipids. Compliance problems include medication side effects.  Risk factors for coronary artery disease include post-menopausal, obesity, hypertension, dyslipidemia, family history and diabetes mellitus.      Review of Systems  Constitutional: Positive for fatigue.  Eyes: Negative for blurred vision.  Respiratory: Negative for shortness of breath.   Cardiovascular: Negative for chest pain and palpitations.  All other systems reviewed and are negative.       Objective:   Physical Exam  Constitutional: She is oriented to person, place, and time. She appears well-developed and well-nourished.  HENT:  Head: Normocephalic.  Right Ear: External ear normal.  Left Ear: External ear normal.  Mouth/Throat: Oropharynx is clear and moist.  Eyes: Pupils are equal, round, and reactive to light.  Neck: Normal range of motion. Neck supple. No thyromegaly present.  Cardiovascular: Normal rate, regular rhythm, normal heart sounds and intact distal pulses.   Pulmonary/Chest: Effort normal and breath sounds normal.  Abdominal: Soft. Bowel sounds are normal. She exhibits no distension. There is no tenderness.  Musculoskeletal: Normal range of motion. She exhibits tenderness. She exhibits no edema.  Top of right foot tenderness with a burning sensation  Neurological: She is alert and oriented to person, place, and time.  Skin: Skin is warm and dry.  Psychiatric: She has a normal mood and affect. Her behavior is normal. Judgment and thought content normal.     BP 135/76  Pulse 87  Temp(Src) 97.4 F (36.3 C) (Oral)  Ht 5' 5.5" (1.664 m)  Wt 264 lb (119.75 kg)  BMI 43.25 kg/m2  Results for orders placed in visit on 09/30/12  POCT GLYCOSYLATED HEMOGLOBIN (HGB A1C)      Result Value Range   Hemoglobin A1C 10.1  Assessment & Plan:   1. Hyperlipemia   2. Type II or unspecified type  diabetes mellitus without mention of complication, not stated as uncontrolled   3. Hypertension    Orders Placed This Encounter  Procedures  . CMP14+EGFR  . NMR, lipoprofile  . POCT glycosylated hemoglobin (Hb A1C)   Meds ordered this encounter  Medications  . DISCONTD: NOVOFINE 30G X 8 MM MISC    Sig:   . Insulin Detemir (LEVEMIR FLEXPEN) 100 UNIT/ML SOPN    Sig: Inject 55 Units into the skin 2 (two) times daily.    Dispense:  45 mL    Refill:  5    NTBS    Order Specific Question:  Supervising Provider    Answer:  Ernestina Penna [[id number]]  . insulin lispro (HUMALOG) 100 UNIT/ML injection    Sig: Inject 8-20 Units into the skin 3 (three) times daily before meals. Sliding scale: 70-110 0 units; 111-160 6 units; 161-220 12 units; 221-280 16 units; 281-340 20 units; 341-400 24 units; 401-460 28 units; 461-520 30 units    Dispense:  10 mL    Refill:  5    Order Specific Question:  Supervising Provider    Answer:  Ernestina Penna [[id number]]  . Insulin Pen Needle (NOVOFINE) 30G X 8 MM MISC    Sig: 5 shots per day    Dispense:  300 each    Refill:  11    Order Specific Question:  Supervising Provider    Answer:  Ernestina Penna [[id number]]  . furosemide (LASIX) 20 MG tablet    Sig: Take 1 tablet (20 mg total) by mouth daily.    Dispense:  30 tablet    Refill:  3    Order Specific Question:  Supervising Provider    Answer:  Ernestina Penna [[id number]]  . lisinopril (PRINIVIL,ZESTRIL) 20 MG tablet    Sig: Take 1 tablet (20 mg total) by mouth 2 (two) times daily.    Dispense:  180 tablet    Refill:  1    Order Specific Question:  Supervising Provider    Answer:  Ernestina Penna [[id number]]  increase levemir to 60 u BID Diet and exercise encouraged Keep diary of blood sugars Follow up in 3 months  Emma Daphine Deutscher, [name]

## 2012-09-30 NOTE — Patient Instructions (Addendum)

## 2012-10-02 ENCOUNTER — Other Ambulatory Visit: Payer: Self-pay | Admitting: Nurse Practitioner

## 2012-10-02 DIAGNOSIS — E785 Hyperlipidemia, unspecified: Secondary | ICD-10-CM

## 2012-10-02 LAB — CMP14+EGFR
ALT: 17 IU/L (ref 0–32)
AST: 13 IU/L (ref 0–40)
Albumin/Globulin Ratio: 1.4 (ref 1.1–2.5)
Albumin: 3.8 g/dL (ref 3.6–4.8)
Alkaline Phosphatase: 103 IU/L (ref 39–117)
BUN/Creatinine Ratio: 16 (ref 11–26)
BUN: 15 mg/dL (ref 8–27)
CO2: 25 mmol/L (ref 18–29)
Calcium: 8.8 mg/dL (ref 8.6–10.2)
Chloride: 101 mmol/L (ref 97–108)
Creatinine, Ser: 0.93 mg/dL (ref 0.57–1.00)
GFR calc Af Amer: 76 mL/min/{1.73_m2} (ref >59)
GFR calc non Af Amer: 66 mL/min/{1.73_m2} (ref >59)
Globulin, Total: 2.8 g/dL (ref 1.5–4.5)
Glucose: 221 mg/dL — ABNORMAL HIGH (ref 65–99)
Potassium: 4.5 mmol/L (ref 3.5–5.2)
Sodium: 138 mmol/L (ref 134–144)
Total Bilirubin: 0.2 mg/dL (ref 0.0–1.2)
Total Protein: 6.6 g/dL (ref 6.0–8.5)

## 2012-10-02 LAB — NMR, LIPOPROFILE
Cholesterol: 213 mg/dL — ABNORMAL HIGH (ref <200)
HDL Cholesterol by NMR: 30 mg/dL — ABNORMAL LOW (ref >=40)
HDL Particle Number: 26.1 umol/L — ABNORMAL LOW (ref >=30.5)
LDL Particle Number: 2047 nmol/L — ABNORMAL HIGH (ref <1000)
LDL Size: 19.7 nm — ABNORMAL LOW (ref >20.5)
LP-IR Score: 76 — ABNORMAL HIGH (ref <=45)
Small LDL Particle Number: 1718 nmol/L — ABNORMAL HIGH (ref <=527)
Triglycerides by NMR: 429 mg/dL — ABNORMAL HIGH (ref <150)

## 2012-10-02 MED ORDER — ROSUVASTATIN CALCIUM 20 MG PO TABS: 20.0000 mg | ORAL_TABLET | Freq: Every day | ORAL | Status: DC

## 2012-10-03 ENCOUNTER — Other Ambulatory Visit: Payer: Self-pay | Admitting: Nurse Practitioner

## 2012-10-03 DIAGNOSIS — E785 Hyperlipidemia, unspecified: Secondary | ICD-10-CM

## 2012-10-03 MED ORDER — ATORVASTATIN CALCIUM 40 MG PO TABS: 40.0000 mg | ORAL_TABLET | Freq: Every day | ORAL | Status: DC

## 2012-10-30 ENCOUNTER — Encounter (INDEPENDENT_AMBULATORY_CARE_PROVIDER_SITE_OTHER): Payer: BC Managed Care – PPO | Admitting: Ophthalmology

## 2012-10-30 DIAGNOSIS — I10 Essential (primary) hypertension: Secondary | ICD-10-CM

## 2012-10-30 DIAGNOSIS — E11311 Type 2 diabetes mellitus with unspecified diabetic retinopathy with macular edema: Secondary | ICD-10-CM

## 2012-10-30 DIAGNOSIS — E11359 Type 2 diabetes mellitus with proliferative diabetic retinopathy without macular edema: Secondary | ICD-10-CM

## 2012-10-30 DIAGNOSIS — H43819 Vitreous degeneration, unspecified eye: Secondary | ICD-10-CM

## 2012-10-30 DIAGNOSIS — E1139 Type 2 diabetes mellitus with other diabetic ophthalmic complication: Secondary | ICD-10-CM

## 2012-10-30 DIAGNOSIS — H35039 Hypertensive retinopathy, unspecified eye: Secondary | ICD-10-CM

## 2012-10-30 DIAGNOSIS — D3130 Benign neoplasm of unspecified choroid: Secondary | ICD-10-CM

## 2012-10-31 ENCOUNTER — Telehealth: Payer: Self-pay | Admitting: Nurse Practitioner

## 2012-10-31 NOTE — Telephone Encounter (Signed)
What needles- Insulin needles?

## 2012-11-01 NOTE — Telephone Encounter (Signed)
Yes

## 2012-12-04 ENCOUNTER — Encounter (INDEPENDENT_AMBULATORY_CARE_PROVIDER_SITE_OTHER): Payer: BC Managed Care – PPO | Admitting: Ophthalmology

## 2012-12-04 DIAGNOSIS — E1139 Type 2 diabetes mellitus with other diabetic ophthalmic complication: Secondary | ICD-10-CM

## 2012-12-04 DIAGNOSIS — E11359 Type 2 diabetes mellitus with proliferative diabetic retinopathy without macular edema: Secondary | ICD-10-CM

## 2012-12-04 DIAGNOSIS — H35039 Hypertensive retinopathy, unspecified eye: Secondary | ICD-10-CM

## 2012-12-04 DIAGNOSIS — D3130 Benign neoplasm of unspecified choroid: Secondary | ICD-10-CM

## 2012-12-04 DIAGNOSIS — E11311 Type 2 diabetes mellitus with unspecified diabetic retinopathy with macular edema: Secondary | ICD-10-CM

## 2012-12-04 DIAGNOSIS — H43819 Vitreous degeneration, unspecified eye: Secondary | ICD-10-CM

## 2012-12-04 DIAGNOSIS — I10 Essential (primary) hypertension: Secondary | ICD-10-CM

## 2012-12-04 DIAGNOSIS — H2510 Age-related nuclear cataract, unspecified eye: Secondary | ICD-10-CM

## 2012-12-11 ENCOUNTER — Telehealth: Payer: Self-pay | Admitting: Nurse Practitioner

## 2012-12-12 ENCOUNTER — Other Ambulatory Visit: Payer: Self-pay | Admitting: *Deleted

## 2012-12-12 MED ORDER — INSULIN PEN NEEDLE 30G X 8 MM MISC: Status: DC

## 2012-12-12 NOTE — Telephone Encounter (Signed)
Pt requesting for more than 5 times a day. rx is running out early per pt.

## 2012-12-17 ENCOUNTER — Telehealth: Payer: Self-pay | Admitting: Nurse Practitioner

## 2012-12-17 ENCOUNTER — Other Ambulatory Visit: Payer: Self-pay | Admitting: Nurse Practitioner

## 2012-12-17 MED ORDER — INSULIN DETEMIR 100 UNIT/ML FLEXPEN: 60.0000 [IU] | PEN_INJECTOR | Freq: Two times a day (BID) | SUBCUTANEOUS | Status: DC

## 2012-12-17 NOTE — Telephone Encounter (Signed)
TAKEN CARE OF

## 2013-01-08 ENCOUNTER — Encounter (INDEPENDENT_AMBULATORY_CARE_PROVIDER_SITE_OTHER): Payer: BC Managed Care – PPO | Admitting: Ophthalmology

## 2013-01-08 DIAGNOSIS — I10 Essential (primary) hypertension: Secondary | ICD-10-CM

## 2013-01-08 DIAGNOSIS — H35039 Hypertensive retinopathy, unspecified eye: Secondary | ICD-10-CM

## 2013-01-08 DIAGNOSIS — H43819 Vitreous degeneration, unspecified eye: Secondary | ICD-10-CM

## 2013-01-08 DIAGNOSIS — E11311 Type 2 diabetes mellitus with unspecified diabetic retinopathy with macular edema: Secondary | ICD-10-CM

## 2013-01-08 DIAGNOSIS — D3130 Benign neoplasm of unspecified choroid: Secondary | ICD-10-CM

## 2013-01-08 DIAGNOSIS — E1139 Type 2 diabetes mellitus with other diabetic ophthalmic complication: Secondary | ICD-10-CM

## 2013-01-08 DIAGNOSIS — E11359 Type 2 diabetes mellitus with proliferative diabetic retinopathy without macular edema: Secondary | ICD-10-CM

## 2013-01-08 DIAGNOSIS — H25049 Posterior subcapsular polar age-related cataract, unspecified eye: Secondary | ICD-10-CM

## 2013-02-12 ENCOUNTER — Encounter (INDEPENDENT_AMBULATORY_CARE_PROVIDER_SITE_OTHER): Payer: BC Managed Care – PPO | Admitting: Ophthalmology

## 2013-02-12 DIAGNOSIS — H2510 Age-related nuclear cataract, unspecified eye: Secondary | ICD-10-CM

## 2013-02-12 DIAGNOSIS — D3130 Benign neoplasm of unspecified choroid: Secondary | ICD-10-CM

## 2013-02-12 DIAGNOSIS — H43819 Vitreous degeneration, unspecified eye: Secondary | ICD-10-CM

## 2013-02-12 DIAGNOSIS — H35039 Hypertensive retinopathy, unspecified eye: Secondary | ICD-10-CM

## 2013-02-12 DIAGNOSIS — E11311 Type 2 diabetes mellitus with unspecified diabetic retinopathy with macular edema: Secondary | ICD-10-CM

## 2013-02-12 DIAGNOSIS — E1139 Type 2 diabetes mellitus with other diabetic ophthalmic complication: Secondary | ICD-10-CM

## 2013-02-12 DIAGNOSIS — E11359 Type 2 diabetes mellitus with proliferative diabetic retinopathy without macular edema: Secondary | ICD-10-CM

## 2013-02-12 DIAGNOSIS — I10 Essential (primary) hypertension: Secondary | ICD-10-CM

## 2013-03-19 ENCOUNTER — Encounter (INDEPENDENT_AMBULATORY_CARE_PROVIDER_SITE_OTHER): Payer: BC Managed Care – PPO | Admitting: Ophthalmology

## 2013-03-21 ENCOUNTER — Encounter (INDEPENDENT_AMBULATORY_CARE_PROVIDER_SITE_OTHER): Payer: BC Managed Care – PPO | Admitting: Ophthalmology

## 2013-03-21 DIAGNOSIS — E1165 Type 2 diabetes mellitus with hyperglycemia: Secondary | ICD-10-CM

## 2013-03-21 DIAGNOSIS — E11311 Type 2 diabetes mellitus with unspecified diabetic retinopathy with macular edema: Secondary | ICD-10-CM

## 2013-03-21 DIAGNOSIS — H43819 Vitreous degeneration, unspecified eye: Secondary | ICD-10-CM

## 2013-03-21 DIAGNOSIS — E1139 Type 2 diabetes mellitus with other diabetic ophthalmic complication: Secondary | ICD-10-CM

## 2013-03-21 DIAGNOSIS — I10 Essential (primary) hypertension: Secondary | ICD-10-CM

## 2013-03-21 DIAGNOSIS — E11359 Type 2 diabetes mellitus with proliferative diabetic retinopathy without macular edema: Secondary | ICD-10-CM

## 2013-03-21 DIAGNOSIS — H35039 Hypertensive retinopathy, unspecified eye: Secondary | ICD-10-CM

## 2013-05-02 ENCOUNTER — Encounter (INDEPENDENT_AMBULATORY_CARE_PROVIDER_SITE_OTHER): Payer: BC Managed Care – PPO | Admitting: Ophthalmology

## 2013-05-05 ENCOUNTER — Encounter (INDEPENDENT_AMBULATORY_CARE_PROVIDER_SITE_OTHER): Payer: BC Managed Care – PPO | Admitting: Ophthalmology

## 2013-05-05 DIAGNOSIS — H35039 Hypertensive retinopathy, unspecified eye: Secondary | ICD-10-CM

## 2013-05-05 DIAGNOSIS — I10 Essential (primary) hypertension: Secondary | ICD-10-CM

## 2013-05-05 DIAGNOSIS — E1139 Type 2 diabetes mellitus with other diabetic ophthalmic complication: Secondary | ICD-10-CM

## 2013-05-05 DIAGNOSIS — E11311 Type 2 diabetes mellitus with unspecified diabetic retinopathy with macular edema: Secondary | ICD-10-CM

## 2013-05-05 DIAGNOSIS — E11359 Type 2 diabetes mellitus with proliferative diabetic retinopathy without macular edema: Secondary | ICD-10-CM

## 2013-05-05 DIAGNOSIS — E1165 Type 2 diabetes mellitus with hyperglycemia: Secondary | ICD-10-CM

## 2013-05-06 ENCOUNTER — Other Ambulatory Visit: Payer: Self-pay | Admitting: Nurse Practitioner

## 2013-05-07 NOTE — Telephone Encounter (Signed)
Last seen and last glucose 09/30/12  MMM

## 2013-05-07 NOTE — Telephone Encounter (Signed)
Patient NTBS for follow up and lab work  

## 2013-05-29 ENCOUNTER — Other Ambulatory Visit: Payer: Self-pay | Admitting: *Deleted

## 2013-05-29 LAB — HM DIABETES EYE EXAM

## 2013-05-29 MED ORDER — FUROSEMIDE 40 MG PO TABS: 40.0000 mg | ORAL_TABLET | Freq: Every day | ORAL | Status: DC

## 2013-05-29 NOTE — Telephone Encounter (Signed)
Patient NTBS for follow up and lab work  

## 2013-06-03 ENCOUNTER — Encounter (INDEPENDENT_AMBULATORY_CARE_PROVIDER_SITE_OTHER): Payer: BC Managed Care – PPO | Admitting: Ophthalmology

## 2013-06-03 DIAGNOSIS — H35039 Hypertensive retinopathy, unspecified eye: Secondary | ICD-10-CM

## 2013-06-03 DIAGNOSIS — H35379 Puckering of macula, unspecified eye: Secondary | ICD-10-CM

## 2013-06-03 DIAGNOSIS — E11359 Type 2 diabetes mellitus with proliferative diabetic retinopathy without macular edema: Secondary | ICD-10-CM

## 2013-06-03 DIAGNOSIS — D3130 Benign neoplasm of unspecified choroid: Secondary | ICD-10-CM

## 2013-06-03 DIAGNOSIS — E1165 Type 2 diabetes mellitus with hyperglycemia: Secondary | ICD-10-CM

## 2013-06-03 DIAGNOSIS — E11311 Type 2 diabetes mellitus with unspecified diabetic retinopathy with macular edema: Secondary | ICD-10-CM

## 2013-06-03 DIAGNOSIS — E1139 Type 2 diabetes mellitus with other diabetic ophthalmic complication: Secondary | ICD-10-CM

## 2013-06-03 DIAGNOSIS — I10 Essential (primary) hypertension: Secondary | ICD-10-CM

## 2013-06-24 ENCOUNTER — Other Ambulatory Visit: Payer: Self-pay

## 2013-06-24 MED ORDER — INSULIN DETEMIR 100 UNIT/ML FLEXPEN: 60.0000 [IU] | PEN_INJECTOR | Freq: Two times a day (BID) | SUBCUTANEOUS | Status: DC

## 2013-06-24 NOTE — Telephone Encounter (Signed)
Last seen and last glucose 09/30/12  MMM

## 2013-06-24 NOTE — Telephone Encounter (Signed)
No more refills after this one until makes appointment to be seen

## 2013-06-24 NOTE — Telephone Encounter (Signed)
Patient aware.

## 2013-06-26 ENCOUNTER — Encounter: Payer: Self-pay | Admitting: Nurse Practitioner

## 2013-06-26 ENCOUNTER — Ambulatory Visit (INDEPENDENT_AMBULATORY_CARE_PROVIDER_SITE_OTHER): Payer: BC Managed Care – PPO | Admitting: Nurse Practitioner

## 2013-06-26 VITALS — BP 146/81 | HR 95 | Temp 98.4°F | Ht 68.4 in | Wt 266.8 lb

## 2013-06-26 DIAGNOSIS — Z23 Encounter for immunization: Secondary | ICD-10-CM

## 2013-06-26 DIAGNOSIS — E785 Hyperlipidemia, unspecified: Secondary | ICD-10-CM

## 2013-06-26 DIAGNOSIS — E119 Type 2 diabetes mellitus without complications: Secondary | ICD-10-CM

## 2013-06-26 DIAGNOSIS — K219 Gastro-esophageal reflux disease without esophagitis: Secondary | ICD-10-CM

## 2013-06-26 DIAGNOSIS — I10 Essential (primary) hypertension: Secondary | ICD-10-CM

## 2013-06-26 DIAGNOSIS — Z713 Dietary counseling and surveillance: Secondary | ICD-10-CM

## 2013-06-26 LAB — POCT GLYCOSYLATED HEMOGLOBIN (HGB A1C): Hemoglobin A1C: 10.6

## 2013-06-26 LAB — POCT UA - MICROALBUMIN

## 2013-06-26 MED ORDER — INSULIN DETEMIR 100 UNIT/ML FLEXPEN: 70.0000 [IU] | PEN_INJECTOR | Freq: Two times a day (BID) | SUBCUTANEOUS | Status: DC

## 2013-06-26 MED ORDER — INSULIN LISPRO 100 UNIT/ML (KWIKPEN): PEN_INJECTOR | SUBCUTANEOUS | Status: DC

## 2013-06-26 MED ORDER — LISINOPRIL 20 MG PO TABS: 20.0000 mg | ORAL_TABLET | Freq: Two times a day (BID) | ORAL | Status: DC

## 2013-06-26 MED ORDER — INSULIN PEN NEEDLE 30G X 8 MM MISC: Status: DC

## 2013-06-26 MED ORDER — FUROSEMIDE 40 MG PO TABS: 40.0000 mg | ORAL_TABLET | Freq: Every day | ORAL | Status: DC

## 2013-06-26 NOTE — Patient Instructions (Signed)

## 2013-06-26 NOTE — Progress Notes (Signed)
Subjective:    Patient ID: Emma Griffin, female    DOB: 04-Nov-1950, 63 y.o.   MRN: [id number]  Patient here today for follow up of chronic medical problems. Her only complaint is that she feels blotted a lot with ots of belching.  Diabetes She presents for her follow-up diabetic visit. She has type 2 diabetes mellitus. There are no hypoglycemic associated symptoms. Associated symptoms include fatigue. Pertinent negatives for diabetes include no blurred vision, no chest pain and no foot ulcerations. There are no hypoglycemic complications. There are no diabetic complications. Risk factors for coronary artery disease include diabetes mellitus, dyslipidemia, hypertension, obesity, post-menopausal and family history. Current diabetic treatment includes insulin injections. She is compliant with treatment all of the time. Her weight is stable. She is following a generally healthy diet. She has not had a previous visit with a dietician. Her breakfast blood glucose range is generally >200 mg/dl. An ACE inhibitor/angiotensin II receptor blocker is being taken. She does not see a podiatrist.Eye exam is current (every 4 weeks).  Hypertension This is a chronic problem. The current episode started more than 1 year ago. The problem has been resolved since onset. The problem is controlled. Associated symptoms include peripheral edema. Pertinent negatives include no blurred vision, chest pain, palpitations or shortness of breath. Risk factors for coronary artery disease include diabetes mellitus, dyslipidemia, family history, obesity and post-menopausal state. Past treatments include ACE inhibitors. The current treatment provides significant improvement. There is no history of a thyroid problem. There is no history of sleep apnea.  Hyperlipidemia This is a chronic problem. The current episode started more than 1 year ago. The problem is uncontrolled. Recent lipid tests were reviewed and are high. Exacerbating diseases  include diabetes. She has no history of hypothyroidism. Pertinent negatives include no chest pain or shortness of breath. Current antihyperlipidemic treatment includes herbal therapy. The current treatment provides no improvement of lipids. Compliance problems include medication side effects.  Risk factors for coronary artery disease include post-menopausal, obesity, hypertension, dyslipidemia, family history and diabetes mellitus.      Review of Systems  Constitutional: Positive for fatigue.  Eyes: Negative for blurred vision.  Respiratory: Negative for shortness of breath.   Cardiovascular: Negative for chest pain and palpitations.  All other systems reviewed and are negative.      Objective:   Physical Exam  Constitutional: She is oriented to person, place, and time. She appears well-developed and well-nourished.  HENT:  Head: Normocephalic.  Right Ear: External ear normal.  Left Ear: External ear normal.  Mouth/Throat: Oropharynx is clear and moist.  Eyes: Pupils are equal, round, and reactive to light.  Neck: Normal range of motion. Neck supple. No thyromegaly present.  Cardiovascular: Normal rate, regular rhythm, normal heart sounds and intact distal pulses.   Pulmonary/Chest: Effort normal and breath sounds normal.  Abdominal: Soft. Bowel sounds are normal. She exhibits no distension. There is no tenderness.  Musculoskeletal: Normal range of motion. She exhibits tenderness. She exhibits no edema.  Top of right foot tenderness with a burning sensation  Neurological: She is alert and oriented to person, place, and time.  Skin: Skin is warm and dry.  Psychiatric: She has a normal mood and affect. Her behavior is normal. Judgment and thought content normal.     BP 146/81  Pulse 95  Temp(Src) 98.4 F (36.9 C) (Oral)  Ht 5' 8.4" (1.737 m)  Wt 266 lb 12.8 oz (121.02 kg)  BMI 40.11 kg/m2     Results for  orders placed in visit on 06/26/13  POCT GLYCOSYLATED HEMOGLOBIN (HGB  A1C)      Result Value Ref Range   Hemoglobin A1C 10.6    POCT UA - MICROALBUMIN      Result Value Ref Range   Microalbumin Ur, POC       Creatinine, POC       Albumin/Creatinine Ratio, Urine, POC      HM DIABETES EYE EXAM      Result Value Ref Range   HM Diabetic Eye Exam Retinopathy (*) No Retinopathy    Assessment & Plan:   1. Hyperlipemia   2. HTN (hypertension)   3. DM (diabetes mellitus)   4. Severe obesity (BMI >= 40)   5. Weight loss counseling, encounter for   6. GERD (gastroesophageal reflux disease)    Orders Placed This Encounter  Procedures  . CMP14+EGFR  . NMR, lipoprofile  . H Pylori, IGM, IGG, IGA AB  . POCT glycosylated hemoglobin (Hb A1C)  . POCT UA - Microalbumin  . HM DIABETES EYE EXAM    This external order was created through the Results Console.   Meds ordered this encounter  Medications  . Insulin Detemir (LEVEMIR FLEXTOUCH) 100 UNIT/ML Pen    Sig: Inject 70 Units into the skin 2 (two) times daily.    Dispense:  45 pen    Refill:  5    Order Specific Question:  Supervising Provider    Answer:  Chipper Herb [[id number]]  . Insulin Pen Needle (NOVOFINE) 30G X 8 MM MISC    Sig: 5 shots per day and use with sliding scale    Dispense:  400 each    Refill:  6    Order Specific Question:  Supervising Provider    Answer:  Chipper Herb [[id number]]  . insulin lispro (HUMALOG PEN) 100 UNIT/ML KiwkPen    Sig: 30 U TID    Dispense:  30 mL    Refill:  11    Order Specific Question:  Supervising Provider    Answer:  Chipper Herb [[id number]]  . lisinopril (PRINIVIL,ZESTRIL) 20 MG tablet    Sig: Take 1 tablet (20 mg total) by mouth 2 (two) times daily.    Dispense:  180 tablet    Refill:  1    Order Specific Question:  Supervising Provider    Answer:  Chipper Herb [[id number]]  . furosemide (LASIX) 40 MG tablet    Sig: Take 1 tablet (40 mg total) by mouth daily.    Dispense:  30 tablet    Refill:  5    Order Specific Question:  Supervising Provider     Answer:  Chipper Herb [[id number]]   Pneumonia vaccine Labs pending Health maintenance reviewed Diet and exercise encouraged Continue all meds Follow up  In 3 months   [place], [name]

## 2013-06-27 LAB — MICROALBUMIN, URINE: Microalbumin, Urine: 13 ug/mL (ref 0.0–17.0)

## 2013-07-03 LAB — CMP14+EGFR
ALT: 27 IU/L (ref 0–32)
AST: 27 IU/L (ref 0–40)
Albumin/Globulin Ratio: 1.1 (ref 1.1–2.5)
Albumin: 4.2 g/dL (ref 3.6–4.8)
Alkaline Phosphatase: 115 IU/L (ref 39–117)
BUN/Creatinine Ratio: 16 (ref 11–26)
BUN: 14 mg/dL (ref 8–27)
CO2: 24 mmol/L (ref 18–29)
Calcium: 10.2 mg/dL (ref 8.7–10.3)
Chloride: 96 mmol/L — ABNORMAL LOW (ref 97–108)
Creatinine, Ser: 0.85 mg/dL (ref 0.57–1.00)
GFR calc Af Amer: 84 mL/min/{1.73_m2} (ref >59)
GFR calc non Af Amer: 73 mL/min/{1.73_m2} (ref >59)
Globulin, Total: 3.9 g/dL (ref 1.5–4.5)
Glucose: 115 mg/dL — ABNORMAL HIGH (ref 65–99)
Potassium: 4 mmol/L (ref 3.5–5.2)
Sodium: 142 mmol/L (ref 134–144)
Total Bilirubin: 0.3 mg/dL (ref 0.0–1.2)
Total Protein: 8.1 g/dL (ref 6.0–8.5)

## 2013-07-03 LAB — H PYLORI, IGM, IGG, IGA AB
H Pylori IgG: <0.9 U/mL (ref 0.0–0.8)
H. pylori, IgA Abs: <9 units (ref 0.0–8.9)
H. pylori, IgM Abs: <9 units (ref 0.0–8.9)

## 2013-07-03 LAB — NMR, LIPOPROFILE
Cholesterol: 265 mg/dL — ABNORMAL HIGH (ref <200)
HDL Cholesterol by NMR: 29 mg/dL — ABNORMAL LOW (ref >=40)
HDL Particle Number: 28.8 umol/L — ABNORMAL LOW (ref >=30.5)
LDL Particle Number: 2451 nmol/L — ABNORMAL HIGH (ref <1000)
LDL Size: 19.7 nm (ref >20.5)
LP-IR Score: 82 — ABNORMAL HIGH (ref <=45)
Small LDL Particle Number: 1981 nmol/L — ABNORMAL HIGH (ref <=527)
Triglycerides by NMR: 606 mg/dL — ABNORMAL HIGH (ref <150)

## 2013-07-07 ENCOUNTER — Telehealth: Payer: Self-pay | Admitting: Family Medicine

## 2013-07-07 NOTE — Telephone Encounter (Signed)
Message copied by [name] on Mon Jul 07, 2013 10:38 AM ------      Message from: [name]      Created: Thu Jul 03, 2013  5:46 PM       Hgba1c discussed at appointment- increased lantus to 70 u BID      Kidney and liver function stable      Cholesterol is terrible- needs to be on a statin will he take crestor- only one strong enough to bring numbers down?      h pylori negative      Continue current meds- low fat diet and exercise and recheck in 3 months       ------

## 2013-07-08 ENCOUNTER — Encounter (INDEPENDENT_AMBULATORY_CARE_PROVIDER_SITE_OTHER): Payer: BC Managed Care – PPO | Admitting: Ophthalmology

## 2013-07-08 DIAGNOSIS — E11359 Type 2 diabetes mellitus with proliferative diabetic retinopathy without macular edema: Secondary | ICD-10-CM

## 2013-07-08 DIAGNOSIS — I10 Essential (primary) hypertension: Secondary | ICD-10-CM

## 2013-07-08 DIAGNOSIS — H43819 Vitreous degeneration, unspecified eye: Secondary | ICD-10-CM

## 2013-07-08 DIAGNOSIS — E11311 Type 2 diabetes mellitus with unspecified diabetic retinopathy with macular edema: Secondary | ICD-10-CM

## 2013-07-08 DIAGNOSIS — E1139 Type 2 diabetes mellitus with other diabetic ophthalmic complication: Secondary | ICD-10-CM

## 2013-07-08 DIAGNOSIS — E1165 Type 2 diabetes mellitus with hyperglycemia: Secondary | ICD-10-CM

## 2013-07-08 DIAGNOSIS — H35039 Hypertensive retinopathy, unspecified eye: Secondary | ICD-10-CM

## 2013-07-08 DIAGNOSIS — D3130 Benign neoplasm of unspecified choroid: Secondary | ICD-10-CM

## 2013-07-21 ENCOUNTER — Other Ambulatory Visit: Payer: Self-pay | Admitting: Nurse Practitioner

## 2013-07-21 MED ORDER — DOXYCYCLINE HYCLATE 100 MG PO TABS
100.0000 mg | ORAL_TABLET | Freq: Two times a day (BID) | ORAL | Status: DC
Start: 2013-07-21 — End: 2014-02-20

## 2013-08-05 ENCOUNTER — Encounter (INDEPENDENT_AMBULATORY_CARE_PROVIDER_SITE_OTHER): Payer: BC Managed Care – PPO | Admitting: Ophthalmology

## 2013-08-05 DIAGNOSIS — I10 Essential (primary) hypertension: Secondary | ICD-10-CM

## 2013-08-05 DIAGNOSIS — H35039 Hypertensive retinopathy, unspecified eye: Secondary | ICD-10-CM

## 2013-08-05 DIAGNOSIS — E11311 Type 2 diabetes mellitus with unspecified diabetic retinopathy with macular edema: Secondary | ICD-10-CM

## 2013-08-05 DIAGNOSIS — E1139 Type 2 diabetes mellitus with other diabetic ophthalmic complication: Secondary | ICD-10-CM

## 2013-08-05 DIAGNOSIS — E11359 Type 2 diabetes mellitus with proliferative diabetic retinopathy without macular edema: Secondary | ICD-10-CM

## 2013-08-05 DIAGNOSIS — H35379 Puckering of macula, unspecified eye: Secondary | ICD-10-CM

## 2013-08-05 DIAGNOSIS — D3130 Benign neoplasm of unspecified choroid: Secondary | ICD-10-CM

## 2013-08-05 DIAGNOSIS — E1165 Type 2 diabetes mellitus with hyperglycemia: Secondary | ICD-10-CM

## 2013-09-02 ENCOUNTER — Encounter (INDEPENDENT_AMBULATORY_CARE_PROVIDER_SITE_OTHER): Payer: BC Managed Care – PPO | Admitting: Ophthalmology

## 2013-09-02 DIAGNOSIS — E1139 Type 2 diabetes mellitus with other diabetic ophthalmic complication: Secondary | ICD-10-CM

## 2013-09-02 DIAGNOSIS — E11359 Type 2 diabetes mellitus with proliferative diabetic retinopathy without macular edema: Secondary | ICD-10-CM

## 2013-09-02 DIAGNOSIS — D3130 Benign neoplasm of unspecified choroid: Secondary | ICD-10-CM

## 2013-09-02 DIAGNOSIS — E1165 Type 2 diabetes mellitus with hyperglycemia: Secondary | ICD-10-CM

## 2013-09-02 DIAGNOSIS — I10 Essential (primary) hypertension: Secondary | ICD-10-CM

## 2013-09-02 DIAGNOSIS — H43819 Vitreous degeneration, unspecified eye: Secondary | ICD-10-CM

## 2013-09-02 DIAGNOSIS — E11311 Type 2 diabetes mellitus with unspecified diabetic retinopathy with macular edema: Secondary | ICD-10-CM

## 2013-09-02 DIAGNOSIS — H2510 Age-related nuclear cataract, unspecified eye: Secondary | ICD-10-CM

## 2013-09-02 DIAGNOSIS — H35039 Hypertensive retinopathy, unspecified eye: Secondary | ICD-10-CM

## 2013-10-07 ENCOUNTER — Encounter (INDEPENDENT_AMBULATORY_CARE_PROVIDER_SITE_OTHER): Payer: BC Managed Care – PPO | Admitting: Ophthalmology

## 2013-10-07 DIAGNOSIS — E1165 Type 2 diabetes mellitus with hyperglycemia: Secondary | ICD-10-CM

## 2013-10-07 DIAGNOSIS — E11359 Type 2 diabetes mellitus with proliferative diabetic retinopathy without macular edema: Secondary | ICD-10-CM

## 2013-10-07 DIAGNOSIS — H43819 Vitreous degeneration, unspecified eye: Secondary | ICD-10-CM

## 2013-10-07 DIAGNOSIS — I10 Essential (primary) hypertension: Secondary | ICD-10-CM

## 2013-10-07 DIAGNOSIS — H35039 Hypertensive retinopathy, unspecified eye: Secondary | ICD-10-CM

## 2013-10-07 DIAGNOSIS — E11311 Type 2 diabetes mellitus with unspecified diabetic retinopathy with macular edema: Secondary | ICD-10-CM

## 2013-10-07 DIAGNOSIS — E1139 Type 2 diabetes mellitus with other diabetic ophthalmic complication: Secondary | ICD-10-CM

## 2013-11-04 ENCOUNTER — Encounter (INDEPENDENT_AMBULATORY_CARE_PROVIDER_SITE_OTHER): Payer: BC Managed Care – PPO | Admitting: Ophthalmology

## 2013-11-04 DIAGNOSIS — E11359 Type 2 diabetes mellitus with proliferative diabetic retinopathy without macular edema: Secondary | ICD-10-CM

## 2013-11-04 DIAGNOSIS — H35039 Hypertensive retinopathy, unspecified eye: Secondary | ICD-10-CM

## 2013-11-04 DIAGNOSIS — E1165 Type 2 diabetes mellitus with hyperglycemia: Secondary | ICD-10-CM

## 2013-11-04 DIAGNOSIS — E11311 Type 2 diabetes mellitus with unspecified diabetic retinopathy with macular edema: Secondary | ICD-10-CM

## 2013-11-04 DIAGNOSIS — D3130 Benign neoplasm of unspecified choroid: Secondary | ICD-10-CM

## 2013-11-04 DIAGNOSIS — I10 Essential (primary) hypertension: Secondary | ICD-10-CM

## 2013-11-04 DIAGNOSIS — E1139 Type 2 diabetes mellitus with other diabetic ophthalmic complication: Secondary | ICD-10-CM

## 2013-12-02 ENCOUNTER — Encounter (INDEPENDENT_AMBULATORY_CARE_PROVIDER_SITE_OTHER): Payer: BC Managed Care – PPO | Admitting: Ophthalmology

## 2013-12-02 DIAGNOSIS — E11311 Type 2 diabetes mellitus with unspecified diabetic retinopathy with macular edema: Secondary | ICD-10-CM

## 2013-12-02 DIAGNOSIS — E11359 Type 2 diabetes mellitus with proliferative diabetic retinopathy without macular edema: Secondary | ICD-10-CM

## 2013-12-02 DIAGNOSIS — I10 Essential (primary) hypertension: Secondary | ICD-10-CM

## 2013-12-02 DIAGNOSIS — H35039 Hypertensive retinopathy, unspecified eye: Secondary | ICD-10-CM

## 2013-12-02 DIAGNOSIS — D3130 Benign neoplasm of unspecified choroid: Secondary | ICD-10-CM

## 2013-12-02 DIAGNOSIS — E1139 Type 2 diabetes mellitus with other diabetic ophthalmic complication: Secondary | ICD-10-CM

## 2013-12-02 DIAGNOSIS — H43819 Vitreous degeneration, unspecified eye: Secondary | ICD-10-CM

## 2013-12-02 DIAGNOSIS — E1165 Type 2 diabetes mellitus with hyperglycemia: Secondary | ICD-10-CM

## 2013-12-30 ENCOUNTER — Encounter (INDEPENDENT_AMBULATORY_CARE_PROVIDER_SITE_OTHER): Payer: BC Managed Care – PPO | Admitting: Ophthalmology

## 2013-12-30 DIAGNOSIS — H43813 Vitreous degeneration, bilateral: Secondary | ICD-10-CM

## 2013-12-30 DIAGNOSIS — H35033 Hypertensive retinopathy, bilateral: Secondary | ICD-10-CM

## 2013-12-30 DIAGNOSIS — D3132 Benign neoplasm of left choroid: Secondary | ICD-10-CM

## 2013-12-30 DIAGNOSIS — I10 Essential (primary) hypertension: Secondary | ICD-10-CM

## 2013-12-30 DIAGNOSIS — E11311 Type 2 diabetes mellitus with unspecified diabetic retinopathy with macular edema: Secondary | ICD-10-CM

## 2013-12-30 DIAGNOSIS — E11359 Type 2 diabetes mellitus with proliferative diabetic retinopathy without macular edema: Secondary | ICD-10-CM

## 2014-01-16 ENCOUNTER — Other Ambulatory Visit: Payer: Self-pay | Admitting: Nurse Practitioner

## 2014-01-19 ENCOUNTER — Other Ambulatory Visit: Payer: Self-pay | Admitting: Nurse Practitioner

## 2014-01-27 ENCOUNTER — Encounter (INDEPENDENT_AMBULATORY_CARE_PROVIDER_SITE_OTHER): Payer: BC Managed Care – PPO | Admitting: Ophthalmology

## 2014-01-27 DIAGNOSIS — D3132 Benign neoplasm of left choroid: Secondary | ICD-10-CM

## 2014-01-27 DIAGNOSIS — E11311 Type 2 diabetes mellitus with unspecified diabetic retinopathy with macular edema: Secondary | ICD-10-CM

## 2014-01-27 DIAGNOSIS — H35033 Hypertensive retinopathy, bilateral: Secondary | ICD-10-CM

## 2014-01-27 DIAGNOSIS — E11351 Type 2 diabetes mellitus with proliferative diabetic retinopathy with macular edema: Secondary | ICD-10-CM

## 2014-01-27 DIAGNOSIS — I10 Essential (primary) hypertension: Secondary | ICD-10-CM

## 2014-01-27 DIAGNOSIS — H43813 Vitreous degeneration, bilateral: Secondary | ICD-10-CM

## 2014-02-20 ENCOUNTER — Ambulatory Visit (INDEPENDENT_AMBULATORY_CARE_PROVIDER_SITE_OTHER): Payer: BC Managed Care – PPO | Admitting: *Deleted

## 2014-02-20 ENCOUNTER — Ambulatory Visit (INDEPENDENT_AMBULATORY_CARE_PROVIDER_SITE_OTHER): Payer: BC Managed Care – PPO | Admitting: Nurse Practitioner

## 2014-02-20 ENCOUNTER — Encounter: Payer: Self-pay | Admitting: Nurse Practitioner

## 2014-02-20 VITALS — BP 138/82 | HR 107 | Temp 97.8°F | Ht 68.0 in | Wt 268.0 lb

## 2014-02-20 DIAGNOSIS — E1136 Type 2 diabetes mellitus with diabetic cataract: Secondary | ICD-10-CM

## 2014-02-20 DIAGNOSIS — IMO0002 Reserved for concepts with insufficient information to code with codable children: Secondary | ICD-10-CM

## 2014-02-20 DIAGNOSIS — E785 Hyperlipidemia, unspecified: Secondary | ICD-10-CM

## 2014-02-20 DIAGNOSIS — E11319 Type 2 diabetes mellitus with unspecified diabetic retinopathy without macular edema: Secondary | ICD-10-CM

## 2014-02-20 DIAGNOSIS — Z23 Encounter for immunization: Secondary | ICD-10-CM

## 2014-02-20 DIAGNOSIS — E1165 Type 2 diabetes mellitus with hyperglycemia: Secondary | ICD-10-CM

## 2014-02-20 DIAGNOSIS — I10 Essential (primary) hypertension: Secondary | ICD-10-CM

## 2014-02-20 LAB — POCT GLYCOSYLATED HEMOGLOBIN (HGB A1C): Hemoglobin A1C: 11.5

## 2014-02-20 LAB — GLUCOSE, POCT (MANUAL RESULT ENTRY): POC Glucose: 116 mg/dl — AB (ref 70–99)

## 2014-02-20 MED ORDER — FUROSEMIDE 40 MG PO TABS: 40.0000 mg | ORAL_TABLET | Freq: Every day | ORAL | Status: DC

## 2014-02-20 MED ORDER — LISINOPRIL 20 MG PO TABS: 20.0000 mg | ORAL_TABLET | Freq: Two times a day (BID) | ORAL | Status: DC

## 2014-02-20 MED ORDER — INSULIN LISPRO 100 UNIT/ML (KWIKPEN): PEN_INJECTOR | SUBCUTANEOUS | Status: DC

## 2014-02-20 MED ORDER — INSULIN DETEMIR 100 UNIT/ML FLEXPEN: 70.0000 [IU] | PEN_INJECTOR | Freq: Two times a day (BID) | SUBCUTANEOUS | Status: DC

## 2014-02-20 NOTE — Progress Notes (Signed)
Subjective:    Patient ID: Emma Griffin, female    DOB: 04-14-1950, 63 y.o.   MRN: [id number]  Patient here today for follow up of chronic medical problems. Her only complaint is that she feels blotted a lot with ots of belching.  Diabetes She presents for her follow-up diabetic visit. She has type 2 diabetes mellitus. No MedicAlert identification noted. There are no hypoglycemic associated symptoms. Associated symptoms include fatigue. Pertinent negatives for diabetes include no chest pain. Symptoms are stable. Current diabetic treatment includes insulin injections. She is compliant with treatment all of the time. Her weight is stable. She has not had a previous visit with a dietitian. She rarely participates in exercise. Her breakfast blood glucose is taken between 9-10 am. Her breakfast blood glucose range is generally 180-200 mg/dl. Her highest blood glucose is >200 mg/dl. Her overall blood glucose range is >200 mg/dl. An ACE inhibitor/angiotensin II receptor blocker is being taken. She does not see a podiatrist.Eye exam is not current.  Hypertension This is a chronic problem. The current episode started more than 1 year ago. The problem is controlled. Pertinent negatives include no chest pain, palpitations or shortness of breath. Risk factors for coronary artery disease include diabetes mellitus, dyslipidemia, post-menopausal state, obesity and sedentary lifestyle. Past treatments include ACE inhibitors and diuretics. The current treatment provides moderate improvement. Compliance problems include diet and exercise.   Hyperlipidemia This is a chronic problem. The current episode started more than 1 year ago. The problem is controlled. Recent lipid tests were reviewed and are normal. Exacerbating diseases include diabetes and obesity. She has no history of hypothyroidism. Pertinent negatives include no chest pain or shortness of breath. The current treatment provides moderate improvement of lipids.  Compliance problems include adherence to diet and adherence to exercise.  Risk factors for coronary artery disease include dyslipidemia, diabetes mellitus, hypertension, post-menopausal and obesity.      Review of Systems  Constitutional: Positive for fatigue.  HENT: Negative.   Respiratory: Negative for shortness of breath.   Cardiovascular: Negative for chest pain and palpitations.  Genitourinary: Negative.   Neurological: Negative.   Psychiatric/Behavioral: Negative.   All other systems reviewed and are negative.      Objective:   Physical Exam  Constitutional: She is oriented to person, place, and time. She appears well-developed and well-nourished.  HENT:  Nose: Nose normal.  Mouth/Throat: Oropharynx is clear and moist.  Eyes: EOM are normal.  Neck: Trachea normal, normal range of motion and full passive range of motion without pain. Neck supple. No JVD present. Carotid bruit is not present. No thyromegaly present.  Cardiovascular: Normal rate, regular rhythm, normal heart sounds and intact distal pulses.  Exam reveals no gallop and no friction rub.   No murmur heard. Pulmonary/Chest: Effort normal and breath sounds normal.  Abdominal: Soft. Bowel sounds are normal. She exhibits no distension and no mass. There is no tenderness.  Musculoskeletal: Normal range of motion.  Lymphadenopathy:    She has no cervical adenopathy.  Neurological: She is alert and oriented to person, place, and time. She has normal reflexes.  Skin: Skin is warm and dry.  Psychiatric: She has a normal mood and affect. Her behavior is normal. Judgment and thought content normal.     BP 150/90 mmHg  Pulse 107  Temp(Src) 97.8 F (36.6 C) (Oral)  Ht _0  (1.727 m)  Wt 268 lb (121.564 kg)  BMI 40.76 kg/m2     Results for orders placed or performed in  visit on 02/20/14  POCT glycosylated hemoglobin (Hb A1C)  Result Value Ref Range   Hemoglobin A1C 11.5%   POCT glucose (manual entry)  Result  Value Ref Range   POC Glucose 116 (A) 70 - 99 mg/dl    Assessment & Plan:   1. Essential hypertension Do not add salt to diet - CMP14+EGFR - furosemide (LASIX) 40 MG tablet; Take 1 tablet (40 mg total) by mouth daily.  Dispense: 30 tablet; Refill: 5 - lisinopril (PRINIVIL,ZESTRIL) 20 MG tablet; Take 1 tablet (20 mg total) by mouth 2 (two) times daily.  Dispense: 180 tablet; Refill: 1  2. Hyperlipemia *low fat diet - NMR, lipoprofile  3. Type 2 diabetes, uncontrolled, with diabetic cataract Increase levemir to 70u until can get to see endo - POCT glycosylated hemoglobin (Hb A1C) - POCT glucose (manual entry) - Ambulatory referral to Endocrinology  4. Type 2 diabetes mellitus with diabetic retinopathy, macular edema presence unspecified, with unspecified retinopathy severity Keep follow up with eye speciallist - Insulin Detemir (LEVEMIR FLEXTOUCH) 100 UNIT/ML Pen; Inject 70 Units into the skin 2 (two) times daily.  Dispense: 45 pen; Refill: 5 - insulin lispro (HUMALOG) 100 UNIT/ML KiwkPen; 30 U TID  Dispense: 30 mL; Refill: 11    Labs pending Health maintenance reviewed Diet and exercise encouraged Continue all meds Follow up  In 3 months   [place], [name]

## 2014-02-20 NOTE — Patient Instructions (Signed)

## 2014-02-21 LAB — CMP14+EGFR
ALT: 24 IU/L (ref 0–32)
AST: 23 IU/L (ref 0–40)
Albumin/Globulin Ratio: 1.1 (ref 1.1–2.5)
Albumin: 3.7 g/dL (ref 3.6–4.8)
Alkaline Phosphatase: 113 IU/L (ref 39–117)
BUN/Creatinine Ratio: 18 (ref 11–26)
BUN: 14 mg/dL (ref 8–27)
CO2: 28 mmol/L (ref 18–29)
Calcium: 9.4 mg/dL (ref 8.7–10.3)
Chloride: 99 mmol/L (ref 97–108)
Creatinine, Ser: 0.78 mg/dL (ref 0.57–1.00)
GFR calc Af Amer: 93 mL/min/{1.73_m2} (ref >59)
GFR calc non Af Amer: 81 mL/min/{1.73_m2} (ref >59)
Globulin, Total: 3.3 g/dL (ref 1.5–4.5)
Glucose: 112 mg/dL — ABNORMAL HIGH (ref 65–99)
Potassium: 4.1 mmol/L (ref 3.5–5.2)
Sodium: 141 mmol/L (ref 134–144)
Total Bilirubin: 0.4 mg/dL (ref 0.0–1.2)
Total Protein: 7 g/dL (ref 6.0–8.5)

## 2014-02-21 LAB — NMR, LIPOPROFILE
Cholesterol: 237 mg/dL — ABNORMAL HIGH (ref 100–199)
HDL Cholesterol by NMR: 29 mg/dL — ABNORMAL LOW (ref >39)
HDL Particle Number: 26.2 umol/L — ABNORMAL LOW (ref >=30.5)
LDL Particle Number: 1947 nmol/L — ABNORMAL HIGH (ref <1000)
LDL Size: 20 nm (ref >20.5)
LP-IR Score: 80 — ABNORMAL HIGH (ref <=45)
Small LDL Particle Number: 1387 nmol/L — ABNORMAL HIGH (ref <=527)
Triglycerides by NMR: 441 mg/dL — ABNORMAL HIGH (ref 0–149)

## 2014-02-23 ENCOUNTER — Telehealth: Payer: Self-pay | Admitting: Nurse Practitioner

## 2014-02-23 NOTE — Telephone Encounter (Signed)
-----   Message from [facility] [place] sent at 02/23/2014 10:31 AM EST ----- Hgba1c discussed at appointment- meds changed- referred to endo Kidney and liver function stable LDL are improving but are still elevated Tri elevated but should improve with better diabetic control Continue current meds- low fat diet and exercise and recheck in 3 months

## 2014-02-23 NOTE — Telephone Encounter (Signed)
Letter sent with results

## 2014-03-03 ENCOUNTER — Encounter (INDEPENDENT_AMBULATORY_CARE_PROVIDER_SITE_OTHER): Payer: BC Managed Care – PPO | Admitting: Ophthalmology

## 2014-03-03 DIAGNOSIS — H35033 Hypertensive retinopathy, bilateral: Secondary | ICD-10-CM

## 2014-03-03 DIAGNOSIS — E11351 Type 2 diabetes mellitus with proliferative diabetic retinopathy with macular edema: Secondary | ICD-10-CM

## 2014-03-03 DIAGNOSIS — H43813 Vitreous degeneration, bilateral: Secondary | ICD-10-CM

## 2014-03-03 DIAGNOSIS — I10 Essential (primary) hypertension: Secondary | ICD-10-CM

## 2014-03-03 DIAGNOSIS — D3132 Benign neoplasm of left choroid: Secondary | ICD-10-CM

## 2014-03-03 DIAGNOSIS — E11311 Type 2 diabetes mellitus with unspecified diabetic retinopathy with macular edema: Secondary | ICD-10-CM

## 2014-03-04 ENCOUNTER — Other Ambulatory Visit: Payer: Self-pay | Admitting: Nurse Practitioner

## 2014-03-30 ENCOUNTER — Ambulatory Visit: Payer: BC Managed Care – PPO | Admitting: Endocrinology

## 2014-04-06 ENCOUNTER — Ambulatory Visit (INDEPENDENT_AMBULATORY_CARE_PROVIDER_SITE_OTHER): Payer: BC Managed Care – PPO | Admitting: Endocrinology

## 2014-04-06 ENCOUNTER — Encounter: Payer: Self-pay | Admitting: Endocrinology

## 2014-04-06 VITALS — BP 138/88 | HR 107 | Temp 99.0°F | Ht 68.0 in | Wt 272.0 lb

## 2014-04-06 DIAGNOSIS — E1136 Type 2 diabetes mellitus with diabetic cataract: Secondary | ICD-10-CM

## 2014-04-06 DIAGNOSIS — IMO0002 Reserved for concepts with insufficient information to code with codable children: Secondary | ICD-10-CM

## 2014-04-06 DIAGNOSIS — E1165 Type 2 diabetes mellitus with hyperglycemia: Secondary | ICD-10-CM

## 2014-04-06 MED ORDER — INSULIN DETEMIR 100 UNIT/ML FLEXPEN: 70.0000 [IU] | PEN_INJECTOR | Freq: Every day | SUBCUTANEOUS | Status: DC

## 2014-04-06 MED ORDER — INSULIN LISPRO 100 UNIT/ML (KWIKPEN): 50.0000 [IU] | PEN_INJECTOR | Freq: Three times a day (TID) | SUBCUTANEOUS | Status: DC

## 2014-04-06 NOTE — Progress Notes (Signed)
Subjective:    Patient ID: Emma Griffin, female    DOB: 04-Oct-1950, 64 y.o.   MRN: [id number]  HPI pt states DM was dx'ed in 1988; she had GDM in 1986; she has mild if any neuropathy of the lower extremities, but she has associated retinopathy; he has been on insulin since 2005; pt says her diet and exercise are not good; she has never had pancreatitis, severe hypoglycemia or DKA.  She says cbg varies from 170-300.  It is in general higher as the day goes on. Past Medical History  Diagnosis Date  . Frozen joint     History of Right and Left Shoulder - no current problem 09/2011   . Hypertension   . IDDM (insulin dependent diabetes mellitus)   . Hyperlipidemia     no meds  . Contact dermatitis     History  . Obesity   . Cellulitis   . Oral cellulitis     History  . Eye problems     fluid in retina tx w/ avastin and lucentis drops, zymar abx drops   . Contact dermatitis   . Kidney stone     kidney stone - surgery required  . MVP (mitral valve prolapse)     abx tx with dental procedures  . Heart murmur     no problem  . Neuromuscular disorder     facial ride side decrease sensation  . Cancer     skin cancer hands/arms    Past Surgical History  Procedure Laterality Date  . Cesarean section  1987    x 1  . Lithotripsy      kidney stone  . Right side wisdom teeth ext      still has left wisdom teeth top/botom  . Tooth extraction      upper and lower back right  . Eye surgery      weak muscle eye surgery as child  . Refractive surgery      broken blood vessels behind eyes - bilaterally  . Hysteroscopy w/d&c  09/20/2011    Procedure: DILATATION AND CURETTAGE /HYSTEROSCOPY;  Surgeon: Allyn Kenner, DO;  Location: [facility];  Service: Gynecology;  Laterality: N/A;  colposcopy    History   Social History  . Marital Status: Married    Spouse Name: N/A    Number of Children: N/A  . Years of Education: N/A   Occupational History  . Not on file.   Social History Main  Topics  . Smoking status: Never Smoker   . Smokeless tobacco: Never Used  . Alcohol Use: Yes     Comment: rarely - wine  . Drug Use: No  . Sexual Activity: Yes    Birth Control/ Protection: None   Other Topics Concern  . Not on file   Social History Narrative    Current Outpatient Prescriptions on File Prior to Visit  Medication Sig Dispense Refill  . bevacizumab (AVASTIN) 1.25 mg/0.1 mL SOLN Apply 1.25 mg to eye See admin instructions. Opthalmic injection every 4-6 weeks    . furosemide (LASIX) 40 MG tablet Take 1 tablet (40 mg total) by mouth daily. (Patient taking differently: Take 20 mg by mouth daily. ) 30 tablet 5  . gatifloxacin (ZYMAR) 0.3 % ophthalmic drops Place 1 drop into the right eye 4 (four) times daily. Used for 2 days after surgery    . ibuprofen (ADVIL,MOTRIN) 200 MG tablet Take 400-600 mg by mouth every 6 (six) hours as needed. For pain/cramps    .  Insulin Pen Needle (NOVOFINE) 30G X 8 MM MISC 5 shots per day and use with sliding scale 400 each 6  . Lancets (ONETOUCH ULTRASOFT) lancets TEST FOUR TIMES DAILY 200 each 1  . lisinopril (PRINIVIL,ZESTRIL) 20 MG tablet Take 1 tablet (20 mg total) by mouth 2 (two) times daily. 180 tablet 1   No current facility-administered medications on file prior to visit.    Allergies  Allergen Reactions  . Amlodipine Other (See Comments)    edema    Family History  Problem Relation Age of Onset  . Diabetes Mother     BP 138/88 mmHg  Pulse 107  Temp(Src) 99 F (37.2 C) (Oral)  Ht 5\' 8"  (1.727 m)  Wt 272 lb (123.378 kg)  BMI 41.37 kg/m2  SpO2 93%   Review of Systems denies weight loss, headache, chest pain, sob, n/v, muscle cramps, excessive diaphoresis, depression, cold intolerance, rhinorrhea, and easy bruising.  She has chronic blurry vision--sees Dr Zigmund Daniel.  She attributes urinary frequency to lasix.     Objective:   Physical Exam VS: see vs page GEN: no distress HEAD: head: no deformity eyes: no  periorbital swelling, no proptosis external nose and ears are normal mouth: no lesion seen NECK: supple, thyroid is not enlarged CHEST WALL: no deformity LUNGS:  Clear to auscultation.   CV: reg rate and rhythm, no murmur ABD: abdomen is soft, nontender.  no hepatosplenomegaly.  not distended.  no hernia MUSCULOSKELETAL: muscle bulk and strength are grossly normal.  no obvious joint swelling.  gait is normal and steady.  EXTEMITIES: no deformity.  no ulcer on the feet, but there are heavy calluses.  feet are of normal color and temp.  2+ bilat leg edema.  There is bilateral onychomycosis of the toenails.  PULSES: dorsalis pedis intact bilat.  no carotid bruit.   NEURO:  cn 2-12 grossly intact.   readily moves all 4's.  sensation is intact to touch on the feet.  SKIN:  Normal texture and temperature.  No rash or suspicious lesion is visible.   NODES:  None palpable at the neck.   PSYCH: alert, well-oriented.  Does not appear anxious nor depressed.     A1c=11.5    Assessment & Plan:  DM: severe exacerbation.  Based on the pattern of her cbg's, she needs some adjustment in her therapy. Morbid obesity: new to me.  I advised weight loss surgery, but she declines.    Patient is advised the following: Patient Instructions  good diet and exercise habits significanly improve the control of your diabetes.  please let me know if you wish to be referred to a dietician.  high blood sugar is very risky to your health.  you should see an eye doctor and dentist every year.  It is very important to get all recommended vaccinations.  controlling your blood pressure and cholesterol drastically reduces the damage diabetes does to your body.  Those who smoke should quit.  please discuss these with your doctor.  check your blood sugar twice a day.  vary the time of day when you check, between before the 3 meals, and at bedtime.  also check if you have symptoms of your blood sugar being too high or too low.   please keep a record of the readings and bring it to your next appointment here.  You can write it on any piece of paper.  please call us sooner if your blood sugar goes below 70, or if you have a  lot of readings over 200.   For now: Please reduce the levemir to 70 units at bedtime only, and:  Increase the humalog to 50 units 3 times a day (just before each meal).  If you are going to be active, take just 20 units.   Please come back for a follow-up appointment in 2-4 weeks.

## 2014-04-06 NOTE — Patient Instructions (Addendum)
good diet and exercise habits significanly improve the control of your diabetes.  please let me know if you wish to be referred to a dietician.  high blood sugar is very risky to your health.  you should see an eye doctor and dentist every year.  It is very important to get all recommended vaccinations.  controlling your blood pressure and cholesterol drastically reduces the damage diabetes does to your body.  Those who smoke should quit.  please discuss these with your doctor.  check your blood sugar twice a day.  vary the time of day when you check, between before the 3 meals, and at bedtime.  also check if you have symptoms of your blood sugar being too high or too low.  please keep a record of the readings and bring it to your next appointment here.  You can write it on any piece of paper.  please call us sooner if your blood sugar goes below 70, or if you have a lot of readings over 200.   For now: Please reduce the levemir to 70 units at bedtime only, and:  Increase the humalog to 50 units 3 times a day (just before each meal).  If you are going to be active, take just 20 units.   Please come back for a follow-up appointment in 2-4 weeks.

## 2014-04-09 ENCOUNTER — Encounter (INDEPENDENT_AMBULATORY_CARE_PROVIDER_SITE_OTHER): Payer: BC Managed Care – PPO | Admitting: Ophthalmology

## 2014-04-09 DIAGNOSIS — H35033 Hypertensive retinopathy, bilateral: Secondary | ICD-10-CM

## 2014-04-09 DIAGNOSIS — I10 Essential (primary) hypertension: Secondary | ICD-10-CM

## 2014-04-09 DIAGNOSIS — E11311 Type 2 diabetes mellitus with unspecified diabetic retinopathy with macular edema: Secondary | ICD-10-CM

## 2014-04-09 DIAGNOSIS — E11351 Type 2 diabetes mellitus with proliferative diabetic retinopathy with macular edema: Secondary | ICD-10-CM

## 2014-04-09 DIAGNOSIS — H43813 Vitreous degeneration, bilateral: Secondary | ICD-10-CM

## 2014-04-09 DIAGNOSIS — H2513 Age-related nuclear cataract, bilateral: Secondary | ICD-10-CM

## 2014-05-04 ENCOUNTER — Ambulatory Visit: Payer: BC Managed Care – PPO | Admitting: Endocrinology

## 2014-05-14 ENCOUNTER — Encounter (INDEPENDENT_AMBULATORY_CARE_PROVIDER_SITE_OTHER): Payer: BC Managed Care – PPO | Admitting: Ophthalmology

## 2014-05-14 DIAGNOSIS — H43813 Vitreous degeneration, bilateral: Secondary | ICD-10-CM

## 2014-05-14 DIAGNOSIS — D3132 Benign neoplasm of left choroid: Secondary | ICD-10-CM | POA: Diagnosis not present

## 2014-05-14 DIAGNOSIS — E1311 Other specified diabetes mellitus with ketoacidosis with coma: Secondary | ICD-10-CM | POA: Diagnosis not present

## 2014-05-14 DIAGNOSIS — E11359 Type 2 diabetes mellitus with proliferative diabetic retinopathy without macular edema: Secondary | ICD-10-CM | POA: Diagnosis not present

## 2014-05-14 DIAGNOSIS — E11351 Type 2 diabetes mellitus with proliferative diabetic retinopathy with macular edema: Secondary | ICD-10-CM | POA: Diagnosis not present

## 2014-05-14 DIAGNOSIS — H35033 Hypertensive retinopathy, bilateral: Secondary | ICD-10-CM | POA: Diagnosis not present

## 2014-05-14 DIAGNOSIS — I10 Essential (primary) hypertension: Secondary | ICD-10-CM | POA: Diagnosis not present

## 2014-05-14 LAB — HM DIABETES EYE EXAM

## 2014-05-27 ENCOUNTER — Ambulatory Visit (INDEPENDENT_AMBULATORY_CARE_PROVIDER_SITE_OTHER): Payer: BC Managed Care – PPO | Admitting: Nurse Practitioner

## 2014-05-27 ENCOUNTER — Encounter: Payer: Self-pay | Admitting: Nurse Practitioner

## 2014-05-27 VITALS — BP 134/82 | HR 81 | Temp 97.4°F | Ht 68.0 in | Wt 264.0 lb

## 2014-05-27 DIAGNOSIS — C4490 Unspecified malignant neoplasm of skin, unspecified: Secondary | ICD-10-CM

## 2014-05-27 DIAGNOSIS — I10 Essential (primary) hypertension: Secondary | ICD-10-CM

## 2014-05-27 DIAGNOSIS — E785 Hyperlipidemia, unspecified: Secondary | ICD-10-CM

## 2014-05-27 DIAGNOSIS — E1136 Type 2 diabetes mellitus with diabetic cataract: Secondary | ICD-10-CM | POA: Diagnosis not present

## 2014-05-27 DIAGNOSIS — E1165 Type 2 diabetes mellitus with hyperglycemia: Secondary | ICD-10-CM | POA: Diagnosis not present

## 2014-05-27 DIAGNOSIS — IMO0002 Reserved for concepts with insufficient information to code with codable children: Secondary | ICD-10-CM

## 2014-05-27 LAB — GLUCOSE, POCT (MANUAL RESULT ENTRY): POC Glucose: 197 mg/dl — AB (ref 70–99)

## 2014-05-27 LAB — POCT GLYCOSYLATED HEMOGLOBIN (HGB A1C): Hemoglobin A1C: 10.1

## 2014-05-27 MED ORDER — FLUOROURACIL 0.5 % EX CREA: TOPICAL_CREAM | Freq: Every day | CUTANEOUS | Status: DC

## 2014-05-27 MED ORDER — GLUCOSE BLOOD VI STRP
ORAL_STRIP | Status: DC
Start: 2014-05-27 — End: 2014-12-16

## 2014-05-27 MED ORDER — INSULIN GLARGINE 300 UNIT/ML ~~LOC~~ SOPN: 70.0000 [IU] | PEN_INJECTOR | Freq: Every day | SUBCUTANEOUS | Status: DC

## 2014-05-27 NOTE — Addendum Note (Signed)
Addended by: [name] on: 05/27/2014 11:13 AM   Modules accepted: Orders

## 2014-05-27 NOTE — Progress Notes (Signed)
Subjective:    Patient ID: Emma Griffin, female    DOB: 11/19/1950, 64 y.o.   MRN: [id number]  Patient here today for follow up of chronic medical problems. Her only complaint is that she feels blotted a lot with ots of belching.  Diabetes She presents for her follow-up diabetic visit. She has type 2 diabetes mellitus. No MedicAlert identification noted. There are no hypoglycemic associated symptoms. Pertinent negatives for diabetes include no chest pain and no fatigue. Symptoms are stable. Current diabetic treatment includes insulin injections. She is compliant with treatment all of the time. Her weight is stable. She has not had a previous visit with a dietitian. She rarely participates in exercise. Her breakfast blood glucose is taken between 9-10 am. Her breakfast blood glucose range is generally 180-200 mg/dl. Her highest blood glucose is >200 mg/dl. Her overall blood glucose range is >200 mg/dl. An ACE inhibitor/angiotensin II receptor blocker is being taken. She does not see a podiatrist.Eye exam is not current.  Hypertension This is a chronic problem. The current episode started more than 1 year ago. The problem is controlled. Pertinent negatives include no chest pain, palpitations or shortness of breath. Risk factors for coronary artery disease include diabetes mellitus, dyslipidemia, post-menopausal state, obesity and sedentary lifestyle. Past treatments include ACE inhibitors and diuretics. The current treatment provides moderate improvement. Compliance problems include diet and exercise.   Hyperlipidemia This is a chronic problem. The current episode started more than 1 year ago. The problem is controlled. Recent lipid tests were reviewed and are normal. Exacerbating diseases include diabetes and obesity. She has no history of hypothyroidism. Pertinent negatives include no chest pain or shortness of breath. The current treatment provides moderate improvement of lipids. Compliance problems  include adherence to diet and adherence to exercise.  Risk factors for coronary artery disease include dyslipidemia, diabetes mellitus, hypertension, post-menopausal and obesity.      Review of Systems  Constitutional: Negative for fatigue.  HENT: Negative.   Respiratory: Negative for shortness of breath.   Cardiovascular: Negative for chest pain and palpitations.  Genitourinary: Negative.   Neurological: Negative.   Psychiatric/Behavioral: Negative.   All other systems reviewed and are negative.      Objective:   Physical Exam  Constitutional: She is oriented to person, place, and time. She appears well-developed and well-nourished.  HENT:  Nose: Nose normal.  Mouth/Throat: Oropharynx is clear and moist.  Eyes: EOM are normal.  Neck: Trachea normal, normal range of motion and full passive range of motion without pain. Neck supple. No JVD present. Carotid bruit is not present. No thyromegaly present.  Cardiovascular: Normal rate, regular rhythm, normal heart sounds and intact distal pulses.  Exam reveals no gallop and no friction rub.   No murmur heard. Pulmonary/Chest: Effort normal and breath sounds normal.  Abdominal: Soft. Bowel sounds are normal. She exhibits no distension and no mass. There is no tenderness.  Musculoskeletal: Normal range of motion.  Lymphadenopathy:    She has no cervical adenopathy.  Neurological: She is alert and oriented to person, place, and time. She has normal reflexes.  Skin: Skin is warm and dry.  Psychiatric: She has a normal mood and affect. Her behavior is normal. Judgment and thought content normal.   BP 134/82 mmHg  Pulse 81  Temp(Src) 97.4 F (36.3 C) (Oral)  Ht '5\' 8"'  (1.727 m)  Wt 264 lb (119.75 kg)  BMI 40.15 kg/m2    Results for orders placed or performed in visit on 05/27/14  POCT glycosylated hemoglobin (Hb A1C)  Result Value Ref Range   Hemoglobin A1C 10.1%   POCT glucose (manual entry)  Result Value Ref Range   POC  Glucose 197 (A) 70 - 99 mg/dl          Assessment & Plan:   1. Essential hypertension Do not add salt to diet - CMP14+EGFR  2. Hyperlipemia Low fat diet - NMR, lipoprofile  3. Type 2 diabetes, uncontrolled, with diabetic cataract Continue carb counting Changed levemir to toujeo- samples given Cont blood sugar diary - POCT glycosylated hemoglobin (Hb A1C) - POCT glucose (manual entry) - Insulin Glargine (TOUJEO SOLOSTAR) 300 UNIT/ML SOPN; Inject 70 Units into the skin daily.  Dispense: 2 pen; Refill: 0 - glucose blood (ONE TOUCH ULTRA TEST) test strip; Test 4x a day and prn  Dx. e11.36  Dispense: 400 each; Refill: 12  4. Severe obesity (BMI >= 40) Discussed diet and exercise for person with BMI >25 Will recheck weight in 3-6 months     Labs pending Health maintenance reviewed Diet and exercise encouraged Continue all meds Follow up  In 3 months   [place], [name]

## 2014-05-27 NOTE — Patient Instructions (Signed)
Diabetes and Foot Care Diabetes may cause you to have problems because of poor blood supply (circulation) to your feet and legs. This may cause the skin on your feet to become thinner, break easier, and heal more slowly. Your skin may become dry, and the skin may peel and crack. You may also have nerve damage in your legs and feet causing decreased feeling in them. You may not notice minor injuries to your feet that could lead to infections or more serious problems. Taking care of your feet is one of the most important things you can do for yourself.  HOME CARE INSTRUCTIONS  Wear shoes at all times, even in the house. Do not go barefoot. Bare feet are easily injured.  Check your feet daily for blisters, cuts, and redness. If you cannot see the bottom of your feet, use a mirror or ask someone for help.  Wash your feet with warm water (do not use hot water) and mild soap. Then pat your feet and the areas between your toes until they are completely dry. Do not soak your feet as this can dry your skin.  Apply a moisturizing lotion or petroleum jelly (that does not contain alcohol and is unscented) to the skin on your feet and to dry, brittle toenails. Do not apply lotion between your toes.  Trim your toenails straight across. Do not dig under them or around the cuticle. File the edges of your nails with an emery board or nail file.  Do not cut corns or calluses or try to remove them with medicine.  Wear clean socks or stockings every day. Make sure they are not too tight. Do not wear knee-high stockings since they may decrease blood flow to your legs.  Wear shoes that fit properly and have enough cushioning. To break in new shoes, wear them for just a few hours a day. This prevents you from injuring your feet. Always look in your shoes before you put them on to be sure there are no objects inside.  Do not cross your legs. This may decrease the blood flow to your feet.  If you find a minor scrape,  cut, or break in the skin on your feet, keep it and the skin around it clean and dry. These areas may be cleansed with mild soap and water. Do not cleanse the area with peroxide, alcohol, or iodine.  When you remove an adhesive bandage, be sure not to damage the skin around it.  If you have a wound, look at it several times a day to make sure it is healing.  Do not use heating pads or hot water bottles. They may burn your skin. If you have lost feeling in your feet or legs, you may not know it is happening until it is too late.  Make sure your health care provider performs a complete foot exam at least annually or more often if you have foot problems. Report any cuts, sores, or bruises to your health care provider immediately. SEEK MEDICAL CARE IF:   You have an injury that is not healing.  You have cuts or breaks in the skin.  You have an ingrown nail.  You notice redness on your legs or feet.  You feel burning or tingling in your legs or feet.  You have pain or cramps in your legs and feet.  Your legs or feet are numb.  Your feet always feel cold. SEEK IMMEDIATE MEDICAL CARE IF:   There is increasing redness,   swelling, or pain in or around a wound.  There is a red line that goes up your leg.  Pus is coming from a wound.  You develop a fever or as directed by your health care provider.  You notice a bad smell coming from an ulcer or wound. Document Released: 02/18/2000 Document Revised: 10/23/2012 Document Reviewed: 07/30/2012 [employer] Patient Information 2015 [employer]. This information is not intended to replace advice given to you by your health care provider. Make sure you discuss any questions you have with your health care provider.  

## 2014-05-28 LAB — NMR, LIPOPROFILE
Cholesterol: 231 mg/dL — ABNORMAL HIGH (ref 100–199)
HDL Cholesterol by NMR: 31 mg/dL — ABNORMAL LOW (ref >39)
HDL Particle Number: 21.1 umol/L — ABNORMAL LOW (ref >=30.5)
LDL Particle Number: 2153 nmol/L — ABNORMAL HIGH (ref <1000)
LDL Size: 19.8 nm (ref >20.5)
LDL-C: 122 mg/dL — ABNORMAL HIGH (ref 0–99)
LP-IR Score: 83 — ABNORMAL HIGH (ref <=45)
Small LDL Particle Number: 1644 nmol/L — ABNORMAL HIGH (ref <=527)
Triglycerides by NMR: 389 mg/dL — ABNORMAL HIGH (ref 0–149)

## 2014-05-28 LAB — CMP14+EGFR
ALT: 21 IU/L (ref 0–32)
AST: 15 IU/L (ref 0–40)
Albumin/Globulin Ratio: 1.1 (ref 1.1–2.5)
Albumin: 3.7 g/dL (ref 3.6–4.8)
Alkaline Phosphatase: 98 IU/L (ref 39–117)
BUN/Creatinine Ratio: 22 (ref 11–26)
BUN: 18 mg/dL (ref 8–27)
Bilirubin Total: 0.3 mg/dL (ref 0.0–1.2)
CO2: 23 mmol/L (ref 18–29)
Calcium: 9.5 mg/dL (ref 8.7–10.3)
Chloride: 99 mmol/L (ref 97–108)
Creatinine, Ser: 0.82 mg/dL (ref 0.57–1.00)
GFR calc Af Amer: 87 mL/min/{1.73_m2} (ref >59)
GFR calc non Af Amer: 76 mL/min/{1.73_m2} (ref >59)
Globulin, Total: 3.5 g/dL (ref 1.5–4.5)
Glucose: 225 mg/dL — ABNORMAL HIGH (ref 65–99)
Potassium: 4.2 mmol/L (ref 3.5–5.2)
Sodium: 139 mmol/L (ref 134–144)
Total Protein: 7.2 g/dL (ref 6.0–8.5)

## 2014-06-01 ENCOUNTER — Encounter: Payer: Self-pay | Admitting: *Deleted

## 2014-06-03 ENCOUNTER — Encounter: Payer: Self-pay | Admitting: Nurse Practitioner

## 2014-06-18 ENCOUNTER — Encounter (INDEPENDENT_AMBULATORY_CARE_PROVIDER_SITE_OTHER): Payer: BC Managed Care – PPO | Admitting: Ophthalmology

## 2014-06-18 DIAGNOSIS — H43813 Vitreous degeneration, bilateral: Secondary | ICD-10-CM

## 2014-06-18 DIAGNOSIS — I10 Essential (primary) hypertension: Secondary | ICD-10-CM

## 2014-06-18 DIAGNOSIS — D3132 Benign neoplasm of left choroid: Secondary | ICD-10-CM | POA: Diagnosis not present

## 2014-06-18 DIAGNOSIS — H2513 Age-related nuclear cataract, bilateral: Secondary | ICD-10-CM | POA: Diagnosis not present

## 2014-06-18 DIAGNOSIS — E11351 Type 2 diabetes mellitus with proliferative diabetic retinopathy with macular edema: Secondary | ICD-10-CM | POA: Diagnosis not present

## 2014-06-18 DIAGNOSIS — H35033 Hypertensive retinopathy, bilateral: Secondary | ICD-10-CM

## 2014-06-18 DIAGNOSIS — E11311 Type 2 diabetes mellitus with unspecified diabetic retinopathy with macular edema: Secondary | ICD-10-CM | POA: Diagnosis not present

## 2014-07-08 ENCOUNTER — Other Ambulatory Visit: Payer: Self-pay | Admitting: Nurse Practitioner

## 2014-07-17 ENCOUNTER — Other Ambulatory Visit: Payer: Self-pay | Admitting: Nurse Practitioner

## 2014-07-23 ENCOUNTER — Encounter (INDEPENDENT_AMBULATORY_CARE_PROVIDER_SITE_OTHER): Payer: BC Managed Care – PPO | Admitting: Ophthalmology

## 2014-07-23 DIAGNOSIS — H43813 Vitreous degeneration, bilateral: Secondary | ICD-10-CM | POA: Diagnosis not present

## 2014-07-23 DIAGNOSIS — H35033 Hypertensive retinopathy, bilateral: Secondary | ICD-10-CM | POA: Diagnosis not present

## 2014-07-23 DIAGNOSIS — I10 Essential (primary) hypertension: Secondary | ICD-10-CM | POA: Diagnosis not present

## 2014-07-23 DIAGNOSIS — E11351 Type 2 diabetes mellitus with proliferative diabetic retinopathy with macular edema: Secondary | ICD-10-CM

## 2014-07-23 DIAGNOSIS — E11311 Type 2 diabetes mellitus with unspecified diabetic retinopathy with macular edema: Secondary | ICD-10-CM | POA: Diagnosis not present

## 2014-07-23 DIAGNOSIS — H2513 Age-related nuclear cataract, bilateral: Secondary | ICD-10-CM | POA: Diagnosis not present

## 2014-08-27 ENCOUNTER — Encounter (INDEPENDENT_AMBULATORY_CARE_PROVIDER_SITE_OTHER): Payer: BC Managed Care – PPO | Admitting: Ophthalmology

## 2014-08-31 ENCOUNTER — Encounter (INDEPENDENT_AMBULATORY_CARE_PROVIDER_SITE_OTHER): Payer: BC Managed Care – PPO | Admitting: Ophthalmology

## 2014-08-31 DIAGNOSIS — D3132 Benign neoplasm of left choroid: Secondary | ICD-10-CM | POA: Diagnosis not present

## 2014-08-31 DIAGNOSIS — E11351 Type 2 diabetes mellitus with proliferative diabetic retinopathy with macular edema: Secondary | ICD-10-CM

## 2014-08-31 DIAGNOSIS — H2513 Age-related nuclear cataract, bilateral: Secondary | ICD-10-CM | POA: Diagnosis not present

## 2014-08-31 DIAGNOSIS — I10 Essential (primary) hypertension: Secondary | ICD-10-CM | POA: Diagnosis not present

## 2014-08-31 DIAGNOSIS — E11311 Type 2 diabetes mellitus with unspecified diabetic retinopathy with macular edema: Secondary | ICD-10-CM | POA: Diagnosis not present

## 2014-08-31 DIAGNOSIS — H43813 Vitreous degeneration, bilateral: Secondary | ICD-10-CM

## 2014-08-31 DIAGNOSIS — H35033 Hypertensive retinopathy, bilateral: Secondary | ICD-10-CM

## 2014-09-14 ENCOUNTER — Ambulatory Visit (INDEPENDENT_AMBULATORY_CARE_PROVIDER_SITE_OTHER): Payer: BC Managed Care – PPO | Admitting: Nurse Practitioner

## 2014-09-14 ENCOUNTER — Encounter: Payer: Self-pay | Admitting: Nurse Practitioner

## 2014-09-14 ENCOUNTER — Ambulatory Visit (INDEPENDENT_AMBULATORY_CARE_PROVIDER_SITE_OTHER): Payer: BC Managed Care – PPO

## 2014-09-14 VITALS — BP 136/82 | HR 94 | Temp 97.6°F | Ht 68.0 in | Wt 264.0 lb

## 2014-09-14 DIAGNOSIS — R0602 Shortness of breath: Secondary | ICD-10-CM | POA: Diagnosis not present

## 2014-09-14 DIAGNOSIS — E1136 Type 2 diabetes mellitus with diabetic cataract: Secondary | ICD-10-CM

## 2014-09-14 DIAGNOSIS — E785 Hyperlipidemia, unspecified: Secondary | ICD-10-CM | POA: Diagnosis not present

## 2014-09-14 DIAGNOSIS — I10 Essential (primary) hypertension: Secondary | ICD-10-CM | POA: Diagnosis not present

## 2014-09-14 DIAGNOSIS — E1165 Type 2 diabetes mellitus with hyperglycemia: Secondary | ICD-10-CM | POA: Diagnosis not present

## 2014-09-14 DIAGNOSIS — IMO0002 Reserved for concepts with insufficient information to code with codable children: Secondary | ICD-10-CM

## 2014-09-14 LAB — POCT GLYCOSYLATED HEMOGLOBIN (HGB A1C): Hemoglobin A1C: 9.5

## 2014-09-14 LAB — POCT UA - MICROALBUMIN: Microalbumin Ur, POC: 20 mg/L

## 2014-09-14 MED ORDER — INSULIN LISPRO 100 UNIT/ML (KWIKPEN): PEN_INJECTOR | SUBCUTANEOUS | Status: DC

## 2014-09-14 MED ORDER — METFORMIN HCL 1000 MG PO TABS: 1000.0000 mg | ORAL_TABLET | Freq: Two times a day (BID) | ORAL | Status: DC

## 2014-09-14 MED ORDER — FUROSEMIDE 40 MG PO TABS: 40.0000 mg | ORAL_TABLET | Freq: Every day | ORAL | Status: DC

## 2014-09-14 MED ORDER — LISINOPRIL 20 MG PO TABS: 20.0000 mg | ORAL_TABLET | Freq: Two times a day (BID) | ORAL | Status: DC

## 2014-09-14 MED ORDER — LEVEMIR FLEXTOUCH 100 UNIT/ML ~~LOC~~ SOPN: PEN_INJECTOR | SUBCUTANEOUS | Status: DC

## 2014-09-14 NOTE — Patient Instructions (Signed)

## 2014-09-14 NOTE — Progress Notes (Signed)
Subjective:    Patient ID: Emma Griffin, female    DOB: 19-May-1950, 64 y.o.   MRN: [id number]  Patient here today for follow up of chronic medical problems. Her only complaint is that she feels SOB and increases with activity.     Diabetes She presents for her follow-up diabetic visit. She has type 2 diabetes mellitus. No MedicAlert identification noted. There are no hypoglycemic associated symptoms. Pertinent negatives for diabetes include no chest pain and no fatigue. Symptoms are stable. Current diabetic treatment includes insulin injections. She is compliant with treatment all of the time. Her weight is stable. She has not had a previous visit with a dietitian. She rarely participates in exercise. Her breakfast blood glucose is taken between 9-10 am. Her breakfast blood glucose range is generally 180-200 mg/dl. Her highest blood glucose is >200 mg/dl. Her overall blood glucose range is >200 mg/dl. An ACE inhibitor/angiotensin II receptor blocker is being taken. She does not see a podiatrist.Eye exam is not current.  Hypertension This is a chronic problem. The current episode started more than 1 year ago. The problem is controlled. Pertinent negatives include no chest pain, palpitations or shortness of breath. Risk factors for coronary artery disease include diabetes mellitus, dyslipidemia, post-menopausal state, obesity and sedentary lifestyle. Past treatments include ACE inhibitors and diuretics. The current treatment provides moderate improvement. Compliance problems include diet and exercise.   Hyperlipidemia This is a chronic problem. The current episode started more than 1 year ago. The problem is controlled. Recent lipid tests were reviewed and are normal. Exacerbating diseases include diabetes and obesity. She has no history of hypothyroidism. Pertinent negatives include no chest pain or shortness of breath. The current treatment provides moderate improvement of lipids. Compliance problems  include adherence to diet and adherence to exercise.  Risk factors for coronary artery disease include dyslipidemia, diabetes mellitus, hypertension, post-menopausal and obesity.      Review of Systems  Constitutional: Negative for fatigue.  HENT: Negative.   Respiratory: Negative for shortness of breath.   Cardiovascular: Negative for chest pain and palpitations.  Genitourinary: Negative.   Neurological: Negative.   Psychiatric/Behavioral: Negative.   All other systems reviewed and are negative.      Objective:   Physical Exam  Constitutional: She is oriented to person, place, and time. She appears well-developed and well-nourished.  HENT:  Nose: Nose normal.  Mouth/Throat: Oropharynx is clear and moist.  Eyes: EOM are normal.  Neck: Trachea normal, normal range of motion and full passive range of motion without pain. Neck supple. No JVD present. Carotid bruit is not present. No thyromegaly present.  Cardiovascular: Normal rate, regular rhythm, normal heart sounds and intact distal pulses.  Exam reveals no gallop and no friction rub.   No murmur heard. Pulmonary/Chest: Effort normal and breath sounds normal.  Abdominal: Soft. Bowel sounds are normal. She exhibits no distension and no mass. There is no tenderness.  Musculoskeletal: Normal range of motion.  Lymphadenopathy:    She has no cervical adenopathy.  Neurological: She is alert and oriented to person, place, and time. She has normal reflexes.  Skin: Skin is warm and dry.  Psychiatric: She has a normal mood and affect. Her behavior is normal. Judgment and thought content normal.    BP 136/82 mmHg  Pulse 94  Temp(Src) 97.6 F (36.4 C) (Oral)  Ht '5\' 8"'  (1.727 m)  Wt 264 lb (119.75 kg)  BMI 40.15 kg/m2    Results for orders placed or performed in visit on  09/14/14  POCT glycosylated hemoglobin (Hb A1C)  Result Value Ref Range   Hemoglobin A1C 9.5   POCT UA - Microalbumin  Result Value Ref Range   Microalbumin Ur,  POC 20 mg/L   EKG- NSR with occasional PAC- Mary-Margaret Hassell Done, [name]  Chest x ray- normal-Preliminary reading by Ronnald Collum, [name]  [facility]   Assessment & Plan:  1. Essential hypertension Do not add slat  To diet - CMP14+EGFR - furosemide (LASIX) 40 MG tablet; Take 1 tablet (40 mg total) by mouth daily.  Dispense: 30 tablet; Refill: 5  2. Hyperlipemia Low fat diet - Lipid panel  3. Type 2 diabetes, uncontrolled, with diabetic cataract Watch carbs in diet Added metformin 1075m BID Keep diary of blood sugars - POCT glycosylated hemoglobin (Hb A1C) - POCT UA - Microalbumin - LEVEMIR FLEXTOUCH 100 UNIT/ML Pen; INJECT 70 UNITS INTO THE SKIN 2 (TWO) TIMES DAILY.  Dispense: 15 mL; Refill: 5 - insulin lispro (HUMALOG KWIKPEN) 100 UNIT/ML KiwkPen; USE 30 UNITS 3 TIMES A DAY  Dispense: 30 pen; Refill: 1  4. Severe obesity (BMI >= 40) Discussed diet and exercise for person with BMI >25 Will recheck weight in 3-6 months   5. SOB (shortness of breath) - DG Chest 2 View; Future - EKG 12-Lead    Labs pending Health maintenance reviewed Diet and exercise encouraged Continue all meds Follow up  In 1 month - recheck Hgba1c  Mary-Margaret MHassell Done [name]

## 2014-09-14 NOTE — Addendum Note (Signed)
Addended by: [name] on: 09/14/2014 09:20 AM   Modules accepted: Orders

## 2014-09-15 ENCOUNTER — Telehealth: Payer: Self-pay

## 2014-09-15 ENCOUNTER — Encounter: Payer: Self-pay | Admitting: *Deleted

## 2014-09-15 LAB — LIPID PANEL
Chol/HDL Ratio: 8.6 ratio units — ABNORMAL HIGH (ref 0.0–4.4)
Cholesterol, Total: 257 mg/dL — ABNORMAL HIGH (ref 100–199)
HDL: 30 mg/dL — ABNORMAL LOW (ref >39)
Triglycerides: 461 mg/dL — ABNORMAL HIGH (ref 0–149)

## 2014-09-15 LAB — CMP14+EGFR
ALT: 22 IU/L (ref 0–32)
AST: 14 IU/L (ref 0–40)
Albumin/Globulin Ratio: 1.1 (ref 1.1–2.5)
Albumin: 3.7 g/dL (ref 3.6–4.8)
Alkaline Phosphatase: 115 IU/L (ref 39–117)
BUN/Creatinine Ratio: 22 (ref 11–26)
BUN: 16 mg/dL (ref 8–27)
Bilirubin Total: 0.3 mg/dL (ref 0.0–1.2)
CO2: 23 mmol/L (ref 18–29)
Calcium: 8.8 mg/dL (ref 8.7–10.3)
Chloride: 96 mmol/L — ABNORMAL LOW (ref 97–108)
Creatinine, Ser: 0.74 mg/dL (ref 0.57–1.00)
GFR calc Af Amer: 99 mL/min/{1.73_m2} (ref >59)
GFR calc non Af Amer: 86 mL/min/{1.73_m2} (ref >59)
Globulin, Total: 3.4 g/dL (ref 1.5–4.5)
Glucose: 262 mg/dL — ABNORMAL HIGH (ref 65–99)
Potassium: 4 mmol/L (ref 3.5–5.2)
Sodium: 137 mmol/L (ref 134–144)
Total Protein: 7.1 g/dL (ref 6.0–8.5)

## 2014-09-15 NOTE — Telephone Encounter (Signed)
Insurance prior authorized [employer]  #[id number]  08/15/14 to 09/14/15

## 2014-09-21 ENCOUNTER — Other Ambulatory Visit: Payer: Self-pay | Admitting: Nurse Practitioner

## 2014-10-12 ENCOUNTER — Encounter (INDEPENDENT_AMBULATORY_CARE_PROVIDER_SITE_OTHER): Payer: BC Managed Care – PPO | Admitting: Ophthalmology

## 2014-10-12 DIAGNOSIS — D3132 Benign neoplasm of left choroid: Secondary | ICD-10-CM | POA: Diagnosis not present

## 2014-10-12 DIAGNOSIS — H43813 Vitreous degeneration, bilateral: Secondary | ICD-10-CM

## 2014-10-12 DIAGNOSIS — I10 Essential (primary) hypertension: Secondary | ICD-10-CM

## 2014-10-12 DIAGNOSIS — E11311 Type 2 diabetes mellitus with unspecified diabetic retinopathy with macular edema: Secondary | ICD-10-CM

## 2014-10-12 DIAGNOSIS — H35033 Hypertensive retinopathy, bilateral: Secondary | ICD-10-CM | POA: Diagnosis not present

## 2014-10-12 DIAGNOSIS — E11351 Type 2 diabetes mellitus with proliferative diabetic retinopathy with macular edema: Secondary | ICD-10-CM

## 2014-10-15 ENCOUNTER — Ambulatory Visit: Payer: BC Managed Care – PPO | Admitting: Nurse Practitioner

## 2014-10-28 ENCOUNTER — Other Ambulatory Visit: Payer: Self-pay | Admitting: Nurse Practitioner

## 2014-11-23 ENCOUNTER — Encounter (INDEPENDENT_AMBULATORY_CARE_PROVIDER_SITE_OTHER): Payer: BC Managed Care – PPO | Admitting: Ophthalmology

## 2014-11-23 DIAGNOSIS — H43813 Vitreous degeneration, bilateral: Secondary | ICD-10-CM

## 2014-11-23 DIAGNOSIS — D3132 Benign neoplasm of left choroid: Secondary | ICD-10-CM

## 2014-11-23 DIAGNOSIS — E11351 Type 2 diabetes mellitus with proliferative diabetic retinopathy with macular edema: Secondary | ICD-10-CM

## 2014-11-23 DIAGNOSIS — I10 Essential (primary) hypertension: Secondary | ICD-10-CM

## 2014-11-23 DIAGNOSIS — E11311 Type 2 diabetes mellitus with unspecified diabetic retinopathy with macular edema: Secondary | ICD-10-CM | POA: Diagnosis not present

## 2014-11-23 DIAGNOSIS — H35033 Hypertensive retinopathy, bilateral: Secondary | ICD-10-CM | POA: Diagnosis not present

## 2014-12-16 ENCOUNTER — Ambulatory Visit (INDEPENDENT_AMBULATORY_CARE_PROVIDER_SITE_OTHER): Payer: Medicare Other | Admitting: Nurse Practitioner

## 2014-12-16 ENCOUNTER — Encounter: Payer: Self-pay | Admitting: Nurse Practitioner

## 2014-12-16 VITALS — BP 139/84 | HR 111 | Temp 99.0°F | Ht 68.0 in | Wt 269.0 lb

## 2014-12-16 DIAGNOSIS — I10 Essential (primary) hypertension: Secondary | ICD-10-CM | POA: Diagnosis not present

## 2014-12-16 DIAGNOSIS — E1136 Type 2 diabetes mellitus with diabetic cataract: Secondary | ICD-10-CM

## 2014-12-16 DIAGNOSIS — E1165 Type 2 diabetes mellitus with hyperglycemia: Secondary | ICD-10-CM

## 2014-12-16 DIAGNOSIS — E785 Hyperlipidemia, unspecified: Secondary | ICD-10-CM

## 2014-12-16 DIAGNOSIS — IMO0002 Reserved for concepts with insufficient information to code with codable children: Secondary | ICD-10-CM

## 2014-12-16 LAB — POCT GLYCOSYLATED HEMOGLOBIN (HGB A1C): Hemoglobin A1C: 10.2

## 2014-12-16 MED ORDER — GLUCOSE BLOOD VI STRP: ORAL_STRIP | Status: DC

## 2014-12-16 MED ORDER — INSULIN PEN NEEDLE 30G X 8 MM MISC: Status: DC

## 2014-12-16 MED ORDER — LISINOPRIL 20 MG PO TABS: 20.0000 mg | ORAL_TABLET | Freq: Two times a day (BID) | ORAL | Status: DC

## 2014-12-16 MED ORDER — LEVEMIR FLEXTOUCH 100 UNIT/ML ~~LOC~~ SOPN: PEN_INJECTOR | SUBCUTANEOUS | Status: DC

## 2014-12-16 MED ORDER — FUROSEMIDE 40 MG PO TABS: 40.0000 mg | ORAL_TABLET | Freq: Every day | ORAL | Status: DC

## 2014-12-16 MED ORDER — FUROSEMIDE 40 MG PO TABS: 90.0000 mg | ORAL_TABLET | Freq: Every day | ORAL | Status: DC

## 2014-12-16 MED ORDER — ONETOUCH ULTRASOFT LANCETS MISC: Status: DC

## 2014-12-16 MED ORDER — INSULIN LISPRO 100 UNIT/ML (KWIKPEN): PEN_INJECTOR | SUBCUTANEOUS | Status: DC

## 2014-12-16 NOTE — Patient Instructions (Signed)
Diabetes and Foot Care Diabetes may cause you to have problems because of poor blood supply (circulation) to your feet and legs. This may cause the skin on your feet to become thinner, break easier, and heal more slowly. Your skin may become dry, and the skin may peel and crack. You may also have nerve damage in your legs and feet causing decreased feeling in them. You may not notice minor injuries to your feet that could lead to infections or more serious problems. Taking care of your feet is one of the most important things you can do for yourself.  HOME CARE INSTRUCTIONS  Wear shoes at all times, even in the house. Do not go barefoot. Bare feet are easily injured.  Check your feet daily for blisters, cuts, and redness. If you cannot see the bottom of your feet, use a mirror or ask someone for help.  Wash your feet with warm water (do not use hot water) and mild soap. Then pat your feet and the areas between your toes until they are completely dry. Do not soak your feet as this can dry your skin.  Apply a moisturizing lotion or petroleum jelly (that does not contain alcohol and is unscented) to the skin on your feet and to dry, brittle toenails. Do not apply lotion between your toes.  Trim your toenails straight across. Do not dig under them or around the cuticle. File the edges of your nails with an emery board or nail file.  Do not cut corns or calluses or try to remove them with medicine.  Wear clean socks or stockings every day. Make sure they are not too tight. Do not wear knee-high stockings since they may decrease blood flow to your legs.  Wear shoes that fit properly and have enough cushioning. To break in new shoes, wear them for just a few hours a day. This prevents you from injuring your feet. Always look in your shoes before you put them on to be sure there are no objects inside.  Do not cross your legs. This may decrease the blood flow to your feet.  If you find a minor scrape,  cut, or break in the skin on your feet, keep it and the skin around it clean and dry. These areas may be cleansed with mild soap and water. Do not cleanse the area with peroxide, alcohol, or iodine.  When you remove an adhesive bandage, be sure not to damage the skin around it.  If you have a wound, look at it several times a day to make sure it is healing.  Do not use heating pads or hot water bottles. They may burn your skin. If you have lost feeling in your feet or legs, you may not know it is happening until it is too late.  Make sure your health care provider performs a complete foot exam at least annually or more often if you have foot problems. Report any cuts, sores, or bruises to your health care provider immediately. SEEK MEDICAL CARE IF:   You have an injury that is not healing.  You have cuts or breaks in the skin.  You have an ingrown nail.  You notice redness on your legs or feet.  You feel burning or tingling in your legs or feet.  You have pain or cramps in your legs and feet.  Your legs or feet are numb.  Your feet always feel cold. SEEK IMMEDIATE MEDICAL CARE IF:   There is increasing redness,   swelling, or pain in or around a wound.  There is a red line that goes up your leg.  Pus is coming from a wound.  You develop a fever or as directed by your health care provider.  You notice a bad smell coming from an ulcer or wound.   This information is not intended to replace advice given to you by your health care provider. Make sure you discuss any questions you have with your health care provider.   Document Released: 02/18/2000 Document Revised: 10/23/2012 Document Reviewed: 07/30/2012 Elsevier Interactive Patient Education 2016 [employer] Inc[employer]  

## 2014-12-16 NOTE — Progress Notes (Signed)
Subjective:    Patient ID: Emma Griffin, female    DOB: [birth year], 64 y.o.   MRN: [id number]  Patient here today for follow up of chronic medical problems.     Diabetes She presents for her follow-up diabetic visit. She has type 2 diabetes mellitus. No MedicAlert identification noted. There are no hypoglycemic associated symptoms. Pertinent negatives for diabetes include no chest pain and no fatigue. Symptoms are stable. Diabetic complications include retinopathy. Current diabetic treatment includes insulin injections (has not been taking metformin at all over the last 3  months.). She is compliant with treatment all of the time. Her weight is stable. She has not had a previous visit with a dietitian. She rarely participates in exercise. Her breakfast blood glucose is taken between 9-10 am. Her breakfast blood glucose range is generally 180-200 mg/dl. Her highest blood glucose is >200 mg/dl. Her overall blood glucose range is >200 mg/dl. An ACE inhibitor/angiotensin II receptor blocker is being taken. She does not see a podiatrist.Eye exam is not current.  Hypertension This is a chronic problem. The current episode started more than 1 year ago. The problem is controlled. Pertinent negatives include no chest pain, palpitations or shortness of breath. Risk factors for coronary artery disease include diabetes mellitus, dyslipidemia, post-menopausal state, obesity and sedentary lifestyle. Past treatments include ACE inhibitors and diuretics. The current treatment provides moderate improvement. Compliance problems include diet and exercise.  Hypertensive end-organ damage includes retinopathy.  Hyperlipidemia This is a chronic problem. The current episode started more than 1 year ago. The problem is controlled. Recent lipid tests were reviewed and are normal. Exacerbating diseases include diabetes and obesity. She has no history of hypothyroidism. Pertinent negatives include no chest pain or shortness of  breath. The current treatment provides moderate improvement of lipids. Compliance problems include adherence to diet and adherence to exercise.  Risk factors for coronary artery disease include dyslipidemia, diabetes mellitus, hypertension, post-menopausal and obesity.      Review of Systems  Constitutional: Negative for fatigue.  HENT: Negative.   Respiratory: Negative for shortness of breath.   Cardiovascular: Negative for chest pain and palpitations.  Genitourinary: Negative.   Neurological: Negative.   Psychiatric/Behavioral: Negative.   All other systems reviewed and are negative.      Objective:   Physical Exam  Constitutional: She is oriented to person, place, and time. She appears well-developed and well-nourished.  HENT:  Nose: Nose normal.  Mouth/Throat: Oropharynx is clear and moist.  Eyes: EOM are normal.  Neck: Trachea normal, normal range of motion and full passive range of motion without pain. Neck supple. No JVD present. Carotid bruit is not present. No thyromegaly present.  Cardiovascular: Normal rate, regular rhythm, normal heart sounds and intact distal pulses.  Exam reveals no gallop and no friction rub.   No murmur heard. Pulmonary/Chest: Effort normal and breath sounds normal.  Abdominal: Soft. Bowel sounds are normal. She exhibits no distension and no mass. There is no tenderness.  Musculoskeletal: Normal range of motion.  Lymphadenopathy:    She has no cervical adenopathy.  Neurological: She is alert and oriented to person, place, and time. She has normal reflexes.  Skin: Skin is warm and dry.  Psychiatric: She has a normal mood and affect. Her behavior is normal. Judgment and thought content normal.   BP 139/84 mmHg  Pulse 111  Temp(Src) 99 F (37.2 C) (Oral)  Ht '5\' 8"'  (1.727 m)  Wt 269 lb (122.018 kg)  BMI 40.91 kg/m2   Results  for orders placed or performed in visit on 12/16/14  POCT glycosylated hemoglobin (Hb A1C)  Result Value Ref Range    Hemoglobin A1C 10.2     Assessment & Plan:  1. Essential hypertension Do not add salt to diet - CMP14+EGFR - lisinopril (PRINIVIL,ZESTRIL) 20 MG tablet; Take 1 tablet (20 mg total) by mouth 2 (two) times daily.  Dispense: 180 tablet; Refill: 1 - furosemide (LASIX) 40 MG tablet; Take 1 tablet (40 mg total) by mouth daily.  Dispense: 30 tablet; Refill: 5  2. Hyperlipemia Low fat diet - Lipid panel  3. Uncontrolled type 2 diabetes mellitus with diabetic cataract, without long-term current use of insulin (HCC) Increased levimir to 75u for 3 days then to 80 u BID Will make appointment with clinical pharmacist - POCT glycosylated hemoglobin (Hb A1C) - LEVEMIR FLEXTOUCH 100 UNIT/ML Pen; INJECT 80 UNITS INTO THE SKIN 2 (TWO) TIMES DAILY.  Dispense: 15 mL; Refill: 5  4. Morbid obesity, unspecified obesity type ([employer]) Discussed diet and exercise for person with BMI >25 Will recheck weight in 3-6 months     Labs pending Health maintenance reviewed Diet and exercise encouraged Continue all meds Follow up  In 3 months   [place], [name]

## 2014-12-17 ENCOUNTER — Telehealth: Payer: Self-pay | Admitting: Nurse Practitioner

## 2014-12-17 DIAGNOSIS — IMO0002 Reserved for concepts with insufficient information to code with codable children: Secondary | ICD-10-CM

## 2014-12-17 DIAGNOSIS — E1165 Type 2 diabetes mellitus with hyperglycemia: Secondary | ICD-10-CM

## 2014-12-17 DIAGNOSIS — E1136 Type 2 diabetes mellitus with diabetic cataract: Secondary | ICD-10-CM

## 2014-12-17 LAB — LIPID PANEL
Chol/HDL Ratio: 9.5 ratio units — ABNORMAL HIGH (ref 0.0–4.4)
Cholesterol, Total: 266 mg/dL — ABNORMAL HIGH (ref 100–199)
HDL: 28 mg/dL — ABNORMAL LOW (ref >39)
Triglycerides: 645 mg/dL (ref 0–149)

## 2014-12-17 LAB — CMP14+EGFR
ALT: 66 IU/L — ABNORMAL HIGH (ref 0–32)
AST: 51 IU/L — ABNORMAL HIGH (ref 0–40)
Albumin/Globulin Ratio: 1.1 (ref 1.1–2.5)
Albumin: 3.9 g/dL (ref 3.6–4.8)
Alkaline Phosphatase: 171 IU/L — ABNORMAL HIGH (ref 39–117)
BUN/Creatinine Ratio: 19 (ref 11–26)
BUN: 15 mg/dL (ref 8–27)
Bilirubin Total: 0.3 mg/dL (ref 0.0–1.2)
CO2: 26 mmol/L (ref 18–29)
Calcium: 9.4 mg/dL (ref 8.7–10.3)
Chloride: 95 mmol/L — ABNORMAL LOW (ref 97–108)
Creatinine, Ser: 0.77 mg/dL (ref 0.57–1.00)
GFR calc Af Amer: 94 mL/min/{1.73_m2} (ref >59)
GFR calc non Af Amer: 81 mL/min/{1.73_m2} (ref >59)
Globulin, Total: 3.6 g/dL (ref 1.5–4.5)
Glucose: 205 mg/dL — ABNORMAL HIGH (ref 65–99)
Potassium: 3.9 mmol/L (ref 3.5–5.2)
Sodium: 140 mmol/L (ref 134–144)
Total Protein: 7.5 g/dL (ref 6.0–8.5)

## 2014-12-17 MED ORDER — INSULIN PEN NEEDLE 30G X 8 MM MISC: Status: DC

## 2014-12-17 MED ORDER — INSULIN LISPRO 100 UNIT/ML (KWIKPEN): PEN_INJECTOR | SUBCUTANEOUS | Status: DC

## 2014-12-17 NOTE — Telephone Encounter (Signed)
rx corrected and sent o pharmacy

## 2014-12-18 NOTE — Telephone Encounter (Signed)
Left message stating that Rx has been corrected and sent to the pharmacy.

## 2014-12-22 ENCOUNTER — Other Ambulatory Visit: Payer: Self-pay | Admitting: Nurse Practitioner

## 2014-12-22 DIAGNOSIS — I10 Essential (primary) hypertension: Secondary | ICD-10-CM

## 2014-12-22 MED ORDER — FUROSEMIDE 40 MG PO TABS: 90.0000 mg | ORAL_TABLET | Freq: Every day | ORAL | Status: DC

## 2014-12-24 ENCOUNTER — Ambulatory Visit: Payer: Medicare Other | Admitting: Pharmacist

## 2015-01-04 ENCOUNTER — Encounter (INDEPENDENT_AMBULATORY_CARE_PROVIDER_SITE_OTHER): Payer: Medicare Other | Admitting: Ophthalmology

## 2015-01-04 DIAGNOSIS — H35033 Hypertensive retinopathy, bilateral: Secondary | ICD-10-CM

## 2015-01-04 DIAGNOSIS — D3132 Benign neoplasm of left choroid: Secondary | ICD-10-CM | POA: Diagnosis not present

## 2015-01-04 DIAGNOSIS — E113513 Type 2 diabetes mellitus with proliferative diabetic retinopathy with macular edema, bilateral: Secondary | ICD-10-CM

## 2015-01-04 DIAGNOSIS — I10 Essential (primary) hypertension: Secondary | ICD-10-CM

## 2015-01-04 DIAGNOSIS — H35341 Macular cyst, hole, or pseudohole, right eye: Secondary | ICD-10-CM

## 2015-01-04 DIAGNOSIS — H43813 Vitreous degeneration, bilateral: Secondary | ICD-10-CM | POA: Diagnosis not present

## 2015-01-04 DIAGNOSIS — H2513 Age-related nuclear cataract, bilateral: Secondary | ICD-10-CM | POA: Diagnosis not present

## 2015-01-04 DIAGNOSIS — E11311 Type 2 diabetes mellitus with unspecified diabetic retinopathy with macular edema: Secondary | ICD-10-CM | POA: Diagnosis not present

## 2015-02-08 ENCOUNTER — Encounter (INDEPENDENT_AMBULATORY_CARE_PROVIDER_SITE_OTHER): Payer: Medicare Other | Admitting: Ophthalmology

## 2015-02-11 ENCOUNTER — Encounter (INDEPENDENT_AMBULATORY_CARE_PROVIDER_SITE_OTHER): Payer: Medicare Other | Admitting: Ophthalmology

## 2015-02-11 DIAGNOSIS — H35033 Hypertensive retinopathy, bilateral: Secondary | ICD-10-CM | POA: Diagnosis not present

## 2015-02-11 DIAGNOSIS — I10 Essential (primary) hypertension: Secondary | ICD-10-CM

## 2015-02-11 DIAGNOSIS — H35341 Macular cyst, hole, or pseudohole, right eye: Secondary | ICD-10-CM | POA: Diagnosis not present

## 2015-02-11 DIAGNOSIS — H2513 Age-related nuclear cataract, bilateral: Secondary | ICD-10-CM | POA: Diagnosis not present

## 2015-02-11 DIAGNOSIS — E113513 Type 2 diabetes mellitus with proliferative diabetic retinopathy with macular edema, bilateral: Secondary | ICD-10-CM

## 2015-02-11 DIAGNOSIS — H43813 Vitreous degeneration, bilateral: Secondary | ICD-10-CM | POA: Diagnosis not present

## 2015-02-11 DIAGNOSIS — D3132 Benign neoplasm of left choroid: Secondary | ICD-10-CM | POA: Diagnosis not present

## 2015-02-11 DIAGNOSIS — E11311 Type 2 diabetes mellitus with unspecified diabetic retinopathy with macular edema: Secondary | ICD-10-CM | POA: Diagnosis not present

## 2015-02-19 ENCOUNTER — Encounter: Payer: Self-pay | Admitting: Nurse Practitioner

## 2015-02-19 ENCOUNTER — Ambulatory Visit (INDEPENDENT_AMBULATORY_CARE_PROVIDER_SITE_OTHER): Payer: Medicare Other | Admitting: Nurse Practitioner

## 2015-02-19 VITALS — BP 147/71 | HR 94 | Temp 97.7°F | Ht 68.0 in | Wt 271.0 lb

## 2015-02-19 DIAGNOSIS — Z23 Encounter for immunization: Secondary | ICD-10-CM | POA: Diagnosis not present

## 2015-02-19 DIAGNOSIS — R1013 Epigastric pain: Secondary | ICD-10-CM | POA: Diagnosis not present

## 2015-02-19 MED ORDER — PANTOPRAZOLE SODIUM 40 MG PO TBEC: 40.0000 mg | DELAYED_RELEASE_TABLET | Freq: Every day | ORAL | Status: DC

## 2015-02-19 NOTE — Progress Notes (Signed)
   Subjective:    Patient ID: Emma Griffin, female    DOB: 1950-05-09, 64 y.o.   MRN: [id number]  HPI Patient in c/o pain in upper abdominal wall- started about 2-3 hours after eating- episode lasted about 30 min- slight nausea- has been intermittent for the last 3 weeks. Has had lots of belching. Not sure if related to what or when she ate. SHe  Has aq history of hiatal hernia and GERD.    Review of Systems  Constitutional: Negative for fever and fatigue.  HENT: Negative.   Respiratory: Negative.   Cardiovascular: Negative.   Genitourinary: Negative.   Musculoskeletal: Negative.   Neurological: Negative.   Psychiatric/Behavioral: Negative.   All other systems reviewed and are negative.        Objective:   Physical Exam  Constitutional: She is oriented to person, place, and time. She appears well-developed and well-nourished.  Cardiovascular: Normal rate, regular rhythm and normal heart sounds.   Pulmonary/Chest: Effort normal and breath sounds normal.  Abdominal: Soft.  Neurological: She is alert and oriented to person, place, and time.  Skin: Skin is warm and dry.  Psychiatric: She has a normal mood and affect. Her behavior is normal. Judgment and thought content normal.   BP 147/71 mmHg  Pulse 94  Temp(Src) 97.7 F (36.5 C) (Oral)  Ht 5\' 8"  (1.727 m)  Wt 271 lb (122.925 kg)  BMI 41.22 kg/m2        Assessment & Plan:  1. Epigastric pain Avoid spicy and fatty foods - pantoprazole (PROTONIX) 40 MG tablet; Take 1 tablet (40 mg total) by mouth daily.  Dispense: 30 tablet; Refill: 3 - H Pylori, IGM, IGG, IGA AB - US Abdomen Limited RUQ; Future  Mary-Margaret Hassell Done, [name]

## 2015-02-19 NOTE — Patient Instructions (Signed)

## 2015-02-19 NOTE — Addendum Note (Signed)
Addended by: [name] on: 02/19/2015 11:30 AM   Modules accepted: Level of Service

## 2015-02-26 ENCOUNTER — Ambulatory Visit: Payer: Medicare Other | Admitting: Nurse Practitioner

## 2015-03-02 ENCOUNTER — Ambulatory Visit (HOSPITAL_COMMUNITY): Payer: Medicare Other

## 2015-03-22 ENCOUNTER — Ambulatory Visit: Payer: Medicare Other | Admitting: Nurse Practitioner

## 2015-03-25 ENCOUNTER — Encounter (INDEPENDENT_AMBULATORY_CARE_PROVIDER_SITE_OTHER): Payer: Medicare Other | Admitting: Ophthalmology

## 2015-03-25 DIAGNOSIS — H35033 Hypertensive retinopathy, bilateral: Secondary | ICD-10-CM | POA: Diagnosis not present

## 2015-03-25 DIAGNOSIS — E113513 Type 2 diabetes mellitus with proliferative diabetic retinopathy with macular edema, bilateral: Secondary | ICD-10-CM | POA: Diagnosis not present

## 2015-03-25 DIAGNOSIS — E11311 Type 2 diabetes mellitus with unspecified diabetic retinopathy with macular edema: Secondary | ICD-10-CM | POA: Diagnosis not present

## 2015-03-25 DIAGNOSIS — H2513 Age-related nuclear cataract, bilateral: Secondary | ICD-10-CM

## 2015-03-25 DIAGNOSIS — H43813 Vitreous degeneration, bilateral: Secondary | ICD-10-CM | POA: Diagnosis not present

## 2015-03-25 DIAGNOSIS — D3132 Benign neoplasm of left choroid: Secondary | ICD-10-CM | POA: Diagnosis not present

## 2015-03-25 DIAGNOSIS — I10 Essential (primary) hypertension: Secondary | ICD-10-CM | POA: Diagnosis not present

## 2015-04-12 ENCOUNTER — Telehealth: Payer: Self-pay | Admitting: Nurse Practitioner

## 2015-05-06 ENCOUNTER — Encounter (INDEPENDENT_AMBULATORY_CARE_PROVIDER_SITE_OTHER): Payer: Medicare Other | Admitting: Ophthalmology

## 2015-05-06 DIAGNOSIS — E11311 Type 2 diabetes mellitus with unspecified diabetic retinopathy with macular edema: Secondary | ICD-10-CM | POA: Diagnosis not present

## 2015-05-06 DIAGNOSIS — H35033 Hypertensive retinopathy, bilateral: Secondary | ICD-10-CM | POA: Diagnosis not present

## 2015-05-06 DIAGNOSIS — H43813 Vitreous degeneration, bilateral: Secondary | ICD-10-CM | POA: Diagnosis not present

## 2015-05-06 DIAGNOSIS — H2513 Age-related nuclear cataract, bilateral: Secondary | ICD-10-CM

## 2015-05-06 DIAGNOSIS — I10 Essential (primary) hypertension: Secondary | ICD-10-CM

## 2015-05-06 DIAGNOSIS — D3132 Benign neoplasm of left choroid: Secondary | ICD-10-CM

## 2015-05-06 DIAGNOSIS — E113513 Type 2 diabetes mellitus with proliferative diabetic retinopathy with macular edema, bilateral: Secondary | ICD-10-CM | POA: Diagnosis not present

## 2015-06-10 ENCOUNTER — Encounter (INDEPENDENT_AMBULATORY_CARE_PROVIDER_SITE_OTHER): Payer: Medicare Other | Admitting: Ophthalmology

## 2015-06-10 DIAGNOSIS — H2513 Age-related nuclear cataract, bilateral: Secondary | ICD-10-CM | POA: Diagnosis not present

## 2015-06-10 DIAGNOSIS — H43813 Vitreous degeneration, bilateral: Secondary | ICD-10-CM

## 2015-06-10 DIAGNOSIS — H35033 Hypertensive retinopathy, bilateral: Secondary | ICD-10-CM

## 2015-06-10 DIAGNOSIS — I10 Essential (primary) hypertension: Secondary | ICD-10-CM | POA: Diagnosis not present

## 2015-06-10 DIAGNOSIS — E113513 Type 2 diabetes mellitus with proliferative diabetic retinopathy with macular edema, bilateral: Secondary | ICD-10-CM

## 2015-06-10 DIAGNOSIS — D3132 Benign neoplasm of left choroid: Secondary | ICD-10-CM

## 2015-06-10 DIAGNOSIS — E11311 Type 2 diabetes mellitus with unspecified diabetic retinopathy with macular edema: Secondary | ICD-10-CM

## 2015-06-10 LAB — HM DIABETES EYE EXAM

## 2015-06-11 ENCOUNTER — Telehealth: Payer: Self-pay | Admitting: Nurse Practitioner

## 2015-06-28 ENCOUNTER — Other Ambulatory Visit: Payer: Self-pay

## 2015-06-28 ENCOUNTER — Ambulatory Visit (INDEPENDENT_AMBULATORY_CARE_PROVIDER_SITE_OTHER): Payer: Medicare Other | Admitting: Nurse Practitioner

## 2015-06-28 ENCOUNTER — Telehealth: Payer: Self-pay | Admitting: Nurse Practitioner

## 2015-06-28 ENCOUNTER — Encounter: Payer: Self-pay | Admitting: Nurse Practitioner

## 2015-06-28 VITALS — BP 146/86 | HR 92 | Temp 98.2°F | Ht 68.0 in | Wt 275.0 lb

## 2015-06-28 DIAGNOSIS — Z1212 Encounter for screening for malignant neoplasm of rectum: Secondary | ICD-10-CM

## 2015-06-28 DIAGNOSIS — R609 Edema, unspecified: Secondary | ICD-10-CM

## 2015-06-28 DIAGNOSIS — IMO0002 Reserved for concepts with insufficient information to code with codable children: Secondary | ICD-10-CM

## 2015-06-28 DIAGNOSIS — E1136 Type 2 diabetes mellitus with diabetic cataract: Secondary | ICD-10-CM | POA: Diagnosis not present

## 2015-06-28 DIAGNOSIS — E1165 Type 2 diabetes mellitus with hyperglycemia: Secondary | ICD-10-CM | POA: Diagnosis not present

## 2015-06-28 DIAGNOSIS — E785 Hyperlipidemia, unspecified: Secondary | ICD-10-CM | POA: Diagnosis not present

## 2015-06-28 DIAGNOSIS — I10 Essential (primary) hypertension: Secondary | ICD-10-CM | POA: Diagnosis not present

## 2015-06-28 DIAGNOSIS — R002 Palpitations: Secondary | ICD-10-CM | POA: Diagnosis not present

## 2015-06-28 DIAGNOSIS — K219 Gastro-esophageal reflux disease without esophagitis: Secondary | ICD-10-CM | POA: Insufficient documentation

## 2015-06-28 DIAGNOSIS — Z1159 Encounter for screening for other viral diseases: Secondary | ICD-10-CM | POA: Diagnosis not present

## 2015-06-28 DIAGNOSIS — R600 Localized edema: Secondary | ICD-10-CM | POA: Insufficient documentation

## 2015-06-28 DIAGNOSIS — Z23 Encounter for immunization: Secondary | ICD-10-CM

## 2015-06-28 LAB — BAYER DCA HB A1C WAIVED: HB A1C (BAYER DCA - WAIVED): 10.6 % — ABNORMAL HIGH (ref <7.0)

## 2015-06-28 MED ORDER — LEVEMIR FLEXTOUCH 100 UNIT/ML ~~LOC~~ SOPN: PEN_INJECTOR | SUBCUTANEOUS | Status: DC

## 2015-06-28 MED ORDER — FUROSEMIDE 40 MG PO TABS: 40.0000 mg | ORAL_TABLET | Freq: Every day | ORAL | Status: DC

## 2015-06-28 MED ORDER — INSULIN PEN NEEDLE 30G X 8 MM MISC: Status: DC

## 2015-06-28 MED ORDER — INSULIN LISPRO 100 UNIT/ML (KWIKPEN): PEN_INJECTOR | SUBCUTANEOUS | Status: DC

## 2015-06-28 MED ORDER — LISINOPRIL 20 MG PO TABS: 20.0000 mg | ORAL_TABLET | Freq: Two times a day (BID) | ORAL | Status: DC

## 2015-06-28 MED ORDER — PANTOPRAZOLE SODIUM 40 MG PO TBEC: 40.0000 mg | DELAYED_RELEASE_TABLET | Freq: Every day | ORAL | Status: DC

## 2015-06-28 NOTE — Addendum Note (Signed)
Addended by: Rolena Infante on: 06/28/2015 05:00 PM   Modules accepted: Orders

## 2015-06-28 NOTE — Telephone Encounter (Signed)
done

## 2015-06-28 NOTE — Progress Notes (Signed)
Subjective:    Patient ID: Emma Griffin, female    DOB: 21-Jan-1951, 65 y.o.   MRN: [id number]  Patient here today for follow up of chronic medical problems.  Outpatient Encounter Prescriptions as of 06/28/2015  Medication Sig  . Ascorbic Acid (VITAMIN C) 1000 MG tablet Take 1,000 mg by mouth daily.  . bevacizumab (AVASTIN) 1.25 mg/0.1 mL SOLN Apply 1.25 mg to eye See admin instructions. Opthalmic injection every 4-6 weeks  . Calcium Carbonate-Vitamin D (CVS CALCIUM/VIT D SOFT CHEWS PO) Take by mouth.  . fluoruracil (CARAC) 0.5 % cream APPLY TOPICALLY DAILY.  . furosemide (LASIX) 40 MG tablet Take 40 mg by mouth.  . gatifloxacin (ZYMAR) 0.3 % ophthalmic drops Place 1 drop into the right eye 4 (four) times daily. Used for 2 days after surgery  . glucose blood (ONE TOUCH ULTRA TEST) test strip Test 4x a day and prn  Dx. e11.36  . ibuprofen (ADVIL,MOTRIN) 200 MG tablet Take 400-600 mg by mouth every 6 (six) hours as needed. For pain/cramps  . insulin lispro (HUMALOG KWIKPEN) 100 UNIT/ML KiwkPen USE 30 UNITS 3 TIMES A DAY (Patient taking differently: 40 Units. USE 30 UNITS 3 TIMES A DAY)  . Insulin Pen Needle (NOVOFINE) 30G X 8 MM MISC 5 SHOTS PER DAY AND USE WITH SLIDING SCALE  . Lancets (ONETOUCH ULTRASOFT) lancets TEST FOUR TIMES DAILY  . LEVEMIR FLEXTOUCH 100 UNIT/ML Pen INJECT 70 UNITS INTO THE SKIN 2 (TWO) TIMES DAILY.  Marland Kitchen lisinopril (PRINIVIL,ZESTRIL) 20 MG tablet Take 1 tablet (20 mg total) by mouth 2 (two) times daily.  . Multiple Vitamins-Minerals (AIRBORNE) CHEW Chew by mouth.  . pantoprazole (PROTONIX) 40 MG tablet Take 1 tablet (40 mg total) by mouth daily.  Marland Kitchen RA KRILL OIL 500 MG CAPS Take by mouth.   No facility-administered encounter medications on file as of 06/28/2015.      Diabetes She presents for her follow-up diabetic visit. She has type 2 diabetes mellitus. No MedicAlert identification noted. There are no hypoglycemic associated symptoms. Pertinent negatives for diabetes  include no chest pain and no fatigue. Symptoms are stable. Diabetic complications include retinopathy. Current diabetic treatment includes insulin injections (patient was suppose to increase levemir to 80u BID but she did  not increas at all. has remained on 70u bid.). She is compliant with treatment all of the time. Her weight is stable. She has not had a previous visit with a dietitian. She rarely participates in exercise. Her breakfast blood glucose is taken between 9-10 am. Her breakfast blood glucose range is generally 180-200 mg/dl. Her highest blood glucose is >200 mg/dl. Her overall blood glucose range is >200 mg/dl. An ACE inhibitor/angiotensin II receptor blocker is being taken. She does not see a podiatrist.Eye exam is not current.  Hypertension This is a chronic problem. The current episode started more than 1 year ago. The problem is controlled. Pertinent negatives include no chest pain, palpitations or shortness of breath. Risk factors for coronary artery disease include diabetes mellitus, dyslipidemia, post-menopausal state, obesity and sedentary lifestyle. Past treatments include ACE inhibitors and diuretics. The current treatment provides moderate improvement. Compliance problems include diet and exercise.  Hypertensive end-organ damage includes retinopathy.  Hyperlipidemia This is a chronic problem. The current episode started more than 1 year ago. The problem is controlled. Recent lipid tests were reviewed and are normal. Exacerbating diseases include diabetes and obesity. She has no history of hypothyroidism. Pertinent negatives include no chest pain or shortness of breath. The current treatment provides  moderate improvement of lipids. Compliance problems include adherence to diet and adherence to exercise.  Risk factors for coronary artery disease include dyslipidemia, diabetes mellitus, hypertension, post-menopausal and obesity.  palpitations Patient c/o palpitations only lasted a couple  of weeks- not having now. GERD Patient has stopped her protonix which she thinks is causing her palpitations. Has occasional heart burn symptoms. No more belching.    Review of Systems  Constitutional: Negative for fatigue.  HENT: Negative.   Respiratory: Negative for shortness of breath.   Cardiovascular: Negative for chest pain and palpitations.  Genitourinary: Negative.   Neurological: Negative.   Psychiatric/Behavioral: Negative.   All other systems reviewed and are negative.      Objective:   Physical Exam  Constitutional: She is oriented to person, place, and time. She appears well-developed and well-nourished.  HENT:  Nose: Nose normal.  Mouth/Throat: Oropharynx is clear and moist.  Eyes: EOM are normal.  Neck: Trachea normal, normal range of motion and full passive range of motion without pain. Neck supple. No JVD present. Carotid bruit is not present. No thyromegaly present.  Cardiovascular: Normal rate, regular rhythm, normal heart sounds and intact distal pulses.  Exam reveals no gallop and no friction rub.   No murmur heard. Pulmonary/Chest: Effort normal and breath sounds normal.  Abdominal: Soft. Bowel sounds are normal. She exhibits no distension and no mass. There is no tenderness.  Musculoskeletal: Normal range of motion.  Lymphadenopathy:    She has no cervical adenopathy.  Neurological: She is alert and oriented to person, place, and time. She has normal reflexes.  Skin: Skin is warm and dry.  Psychiatric: She has a normal mood and affect. Her behavior is normal. Judgment and thought content normal.   BP 146/86 mmHg  Pulse 92  Temp(Src) 98.2 F (36.8 C) (Oral)  Ht _0  (1.727 m)  Wt 275 lb (124.739 kg)  BMI 41.82 kg/m2  Hgba1c discussed at appointment 10.6% up from 10.2 last visit   Assessment & Plan:  1. 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 Essential hypertension Do not add salt to diet - CMP14+EGFR - lisinopril (PRINIVIL,ZESTRIL) 20 MG tablet; Take 1 tablet (20 mg total) by mouth 2 (two) times daily.  Dispense: 180 tablet; Refill: 1  3. Hyperlipemia Low fat diet - Lipid panel  4. Palpitations Avoid caffeine - Thyroid Panel With TSH  5. Morbid obesity, unspecified obesity type ([place]) Discussed diet and exercise for person with BMI >25 Will recheck weight in 3-6 months  6. Peripheral edema elevate legs when sitting - furosemide (LASIX) 40 MG tablet; Take 1 tablet (40 mg total) by mouth daily.  Dispense: 30 tablet; Refill: 5  7. Gastroesophageal reflux disease without esophagitis Avoid spicy foods Do not eat 2 hours prior to bedtime  8. Screening for malignant neoplasm of the rectum - Fecal occult blood, imunochemical; Future  9. Need for hepatitis C screening test - Hepatitis C antibody    Labs pending Health maintenance reviewed Diet and exercise encouraged Continue all meds Follow up  In 3 months   [place], [name]

## 2015-06-28 NOTE — Patient Instructions (Signed)

## 2015-06-29 LAB — CMP14+EGFR
ALT: 40 IU/L — ABNORMAL HIGH (ref 0–32)
AST: 44 IU/L — ABNORMAL HIGH (ref 0–40)
Albumin/Globulin Ratio: 1.1 — ABNORMAL LOW (ref 1.2–2.2)
Albumin: 3.8 g/dL (ref 3.6–4.8)
Alkaline Phosphatase: 103 IU/L (ref 39–117)
BUN/Creatinine Ratio: 18 (ref 12–28)
BUN: 12 mg/dL (ref 8–27)
Bilirubin Total: 0.3 mg/dL (ref 0.0–1.2)
CO2: 25 mmol/L (ref 18–29)
Calcium: 8.7 mg/dL (ref 8.7–10.3)
Chloride: 95 mmol/L — ABNORMAL LOW (ref 96–106)
Creatinine, Ser: 0.65 mg/dL (ref 0.57–1.00)
GFR calc Af Amer: 108 mL/min/{1.73_m2} (ref >59)
GFR calc non Af Amer: 93 mL/min/{1.73_m2} (ref >59)
Globulin, Total: 3.6 g/dL (ref 1.5–4.5)
Glucose: 248 mg/dL — ABNORMAL HIGH (ref 65–99)
Potassium: 3.7 mmol/L (ref 3.5–5.2)
Sodium: 137 mmol/L (ref 134–144)
Total Protein: 7.4 g/dL (ref 6.0–8.5)

## 2015-06-29 LAB — THYROID PANEL WITH TSH
Free Thyroxine Index: 2.1 (ref 1.2–4.9)
T3 Uptake Ratio: 24 % (ref 24–39)
T4, Total: 8.8 ug/dL (ref 4.5–12.0)
TSH: 4.3 u[IU]/mL (ref 0.450–4.500)

## 2015-06-29 LAB — LIPID PANEL
Chol/HDL Ratio: 8 ratio units — ABNORMAL HIGH (ref 0.0–4.4)
Cholesterol, Total: 224 mg/dL — ABNORMAL HIGH (ref 100–199)
HDL: 28 mg/dL — ABNORMAL LOW (ref >39)
Triglycerides: 418 mg/dL — ABNORMAL HIGH (ref 0–149)

## 2015-06-29 LAB — HEPATITIS C ANTIBODY: Hep C Virus Ab: <0.1 s/co ratio (ref 0.0–0.9)

## 2015-07-22 ENCOUNTER — Encounter (INDEPENDENT_AMBULATORY_CARE_PROVIDER_SITE_OTHER): Payer: Medicare Other | Admitting: Ophthalmology

## 2015-07-22 DIAGNOSIS — I10 Essential (primary) hypertension: Secondary | ICD-10-CM | POA: Diagnosis not present

## 2015-07-22 DIAGNOSIS — H43813 Vitreous degeneration, bilateral: Secondary | ICD-10-CM

## 2015-07-22 DIAGNOSIS — E11311 Type 2 diabetes mellitus with unspecified diabetic retinopathy with macular edema: Secondary | ICD-10-CM | POA: Diagnosis not present

## 2015-07-22 DIAGNOSIS — H2513 Age-related nuclear cataract, bilateral: Secondary | ICD-10-CM | POA: Diagnosis not present

## 2015-07-22 DIAGNOSIS — E113513 Type 2 diabetes mellitus with proliferative diabetic retinopathy with macular edema, bilateral: Secondary | ICD-10-CM

## 2015-07-22 DIAGNOSIS — H35033 Hypertensive retinopathy, bilateral: Secondary | ICD-10-CM | POA: Diagnosis not present

## 2015-07-22 DIAGNOSIS — D3132 Benign neoplasm of left choroid: Secondary | ICD-10-CM

## 2015-08-26 ENCOUNTER — Encounter (INDEPENDENT_AMBULATORY_CARE_PROVIDER_SITE_OTHER): Payer: Medicare Other | Admitting: Ophthalmology

## 2015-08-26 DIAGNOSIS — H43813 Vitreous degeneration, bilateral: Secondary | ICD-10-CM | POA: Diagnosis not present

## 2015-08-26 DIAGNOSIS — H2513 Age-related nuclear cataract, bilateral: Secondary | ICD-10-CM | POA: Diagnosis not present

## 2015-08-26 DIAGNOSIS — H35033 Hypertensive retinopathy, bilateral: Secondary | ICD-10-CM

## 2015-08-26 DIAGNOSIS — D3132 Benign neoplasm of left choroid: Secondary | ICD-10-CM

## 2015-08-26 DIAGNOSIS — H35341 Macular cyst, hole, or pseudohole, right eye: Secondary | ICD-10-CM | POA: Diagnosis not present

## 2015-08-26 DIAGNOSIS — E113513 Type 2 diabetes mellitus with proliferative diabetic retinopathy with macular edema, bilateral: Secondary | ICD-10-CM | POA: Diagnosis not present

## 2015-08-26 DIAGNOSIS — I10 Essential (primary) hypertension: Secondary | ICD-10-CM | POA: Diagnosis not present

## 2015-08-26 DIAGNOSIS — E11311 Type 2 diabetes mellitus with unspecified diabetic retinopathy with macular edema: Secondary | ICD-10-CM | POA: Diagnosis not present

## 2015-09-27 ENCOUNTER — Ambulatory Visit (INDEPENDENT_AMBULATORY_CARE_PROVIDER_SITE_OTHER): Payer: Medicare Other | Admitting: Nurse Practitioner

## 2015-09-27 ENCOUNTER — Encounter: Payer: Self-pay | Admitting: Nurse Practitioner

## 2015-09-27 ENCOUNTER — Other Ambulatory Visit: Payer: Self-pay | Admitting: Nurse Practitioner

## 2015-09-27 VITALS — BP 111/63 | HR 99 | Temp 98.9°F | Ht 68.0 in | Wt 272.6 lb

## 2015-09-27 DIAGNOSIS — R600 Localized edema: Secondary | ICD-10-CM

## 2015-09-27 DIAGNOSIS — I10 Essential (primary) hypertension: Secondary | ICD-10-CM

## 2015-09-27 DIAGNOSIS — E1136 Type 2 diabetes mellitus with diabetic cataract: Secondary | ICD-10-CM | POA: Diagnosis not present

## 2015-09-27 DIAGNOSIS — E1165 Type 2 diabetes mellitus with hyperglycemia: Secondary | ICD-10-CM

## 2015-09-27 DIAGNOSIS — K219 Gastro-esophageal reflux disease without esophagitis: Secondary | ICD-10-CM | POA: Diagnosis not present

## 2015-09-27 DIAGNOSIS — E785 Hyperlipidemia, unspecified: Secondary | ICD-10-CM | POA: Diagnosis not present

## 2015-09-27 DIAGNOSIS — R609 Edema, unspecified: Secondary | ICD-10-CM

## 2015-09-27 DIAGNOSIS — IMO0002 Reserved for concepts with insufficient information to code with codable children: Secondary | ICD-10-CM

## 2015-09-27 LAB — BAYER DCA HB A1C WAIVED: HB A1C (BAYER DCA - WAIVED): 10.2 % — ABNORMAL HIGH (ref <7.0)

## 2015-09-27 MED ORDER — LEVEMIR FLEXTOUCH 100 UNIT/ML ~~LOC~~ SOPN: PEN_INJECTOR | SUBCUTANEOUS | 1 refills | Status: DC

## 2015-09-27 MED ORDER — LISINOPRIL 20 MG PO TABS: 20.0000 mg | ORAL_TABLET | Freq: Two times a day (BID) | ORAL | 1 refills | Status: DC

## 2015-09-27 MED ORDER — FUROSEMIDE 40 MG PO TABS: 40.0000 mg | ORAL_TABLET | Freq: Every day | ORAL | 1 refills | Status: DC

## 2015-09-27 MED ORDER — INSULIN LISPRO 100 UNIT/ML (KWIKPEN): PEN_INJECTOR | SUBCUTANEOUS | 5 refills | Status: DC

## 2015-09-27 NOTE — Progress Notes (Addendum)
Subjective:    Patient ID: Emma Griffin, female    DOB: 11-20-1950, 65 y.o.   MRN: [id number]  Patient here today for follow up of chronic medical problems.  Outpatient Encounter Prescriptions as of 09/27/2015  Medication Sig  . Ascorbic Acid (VITAMIN C) 1000 MG tablet Take 1,000 mg by mouth daily.  . bevacizumab (AVASTIN) 1.25 mg/0.1 mL SOLN Apply 1.25 mg to eye See admin instructions. Opthalmic injection every 4-6 weeks  . Calcium Carbonate-Vitamin D (CVS CALCIUM/VIT D SOFT CHEWS PO) Take by mouth.  . fluoruracil (CARAC) 0.5 % cream APPLY TOPICALLY DAILY.  . furosemide (LASIX) 40 MG tablet Take 1 tablet (40 mg total) by mouth daily.  Marland Kitchen gatifloxacin (ZYMAR) 0.3 % ophthalmic drops Place 1 drop into the right eye 4 (four) times daily. Used for 2 days after surgery  . glucose blood (ONE TOUCH ULTRA TEST) test strip Test 4x a day and prn  Dx. e11.36  . ibuprofen (ADVIL,MOTRIN) 200 MG tablet Take 400-600 mg by mouth every 6 (six) hours as needed. For pain/cramps  . insulin lispro (HUMALOG KWIKPEN) 100 UNIT/ML KiwkPen USE 40 UNITS 3 TIMES A DAY  . Insulin Pen Needle (NOVOFINE) 30G X 8 MM MISC 5 SHOTS PER DAY AND USE WITH SLIDING SCALE  . Lancets (ONETOUCH ULTRASOFT) lancets TEST FOUR TIMES DAILY  . LEVEMIR FLEXTOUCH 100 UNIT/ML Pen INJECT 80 UNITS INTO THE SKIN 2 (TWO) TIMES DAILY.  Marland Kitchen lisinopril (PRINIVIL,ZESTRIL) 20 MG tablet Take 1 tablet (20 mg total) by mouth 2 (two) times daily.  . Multiple Vitamins-Minerals (AIRBORNE) CHEW Chew by mouth.  . RA KRILL OIL 500 MG CAPS Take by mouth.  . [DISCONTINUED] furosemide (LASIX) 40 MG tablet Take 1 tablet (40 mg total) by mouth daily.  . [DISCONTINUED] insulin lispro (HUMALOG KWIKPEN) 100 UNIT/ML KiwkPen USE 40 UNITS 3 TIMES A DAY  . [DISCONTINUED] LEVEMIR FLEXTOUCH 100 UNIT/ML Pen INJECT 80 UNITS INTO THE SKIN 2 (TWO) TIMES DAILY.  . [DISCONTINUED] lisinopril (PRINIVIL,ZESTRIL) 20 MG tablet TAKE 1 TABLET (20 MG TOTAL) BY MOUTH 2 (TWO) TIMES DAILY.    No facility-administered encounter medications on file as of 09/27/2015.       Diabetes  She presents for her follow-up diabetic visit. She has type 2 diabetes mellitus. No MedicAlert identification noted. Her disease course has been fluctuating. There are no hypoglycemic associated symptoms. Pertinent negatives for diabetes include no chest pain and no fatigue. Symptoms are stable. Diabetic complications include retinopathy. Current diabetic treatment includes insulin injections (patient was suppose to increase levemir to 80u BID but she did  not increas at all. has remained on 70u bid.). She is compliant with treatment all of the time. Her weight is stable. She is following a high fat/cholesterol diet. When asked about meal planning, she reported none. She has not had a previous visit with a dietitian. She rarely participates in exercise. Her breakfast blood glucose is taken between 9-10 am. Her breakfast blood glucose range is generally >200 mg/dl. Her highest blood glucose is >200 mg/dl. Her overall blood glucose range is >200 mg/dl. An ACE inhibitor/angiotensin II receptor blocker is being taken. She does not see a podiatrist.Eye exam is not current.  Hypertension  This is a chronic problem. The current episode started more than 1 year ago. The problem is controlled. Pertinent negatives include no chest pain, palpitations or shortness of breath. Risk factors for coronary artery disease include diabetes mellitus, dyslipidemia, post-menopausal state, obesity and sedentary lifestyle. Past treatments include ACE inhibitors and diuretics.  The current treatment provides moderate improvement. Compliance problems include diet and exercise.  Hypertensive end-organ damage includes retinopathy.  Hyperlipidemia  This is a chronic problem. The current episode started more than 1 year ago. The problem is controlled. Recent lipid tests were reviewed and are normal. Exacerbating diseases include diabetes and  obesity. She has no history of hypothyroidism. Pertinent negatives include no chest pain or shortness of breath. The current treatment provides moderate improvement of lipids. Compliance problems include adherence to diet and adherence to exercise.  Risk factors for coronary artery disease include dyslipidemia, diabetes mellitus, hypertension, post-menopausal and obesity.  palpitations Patient c/o palpitations only lasted a couple of weeks- not having now. GERD Patient has stopped her protonix which she thinks is causing her palpitations. Has occasional heart burn symptoms. No more belching.    Review of Systems  Constitutional: Negative for fatigue.  HENT: Negative.   Respiratory: Negative for shortness of breath.   Cardiovascular: Negative for chest pain and palpitations.  Genitourinary: Negative.   Neurological: Negative.   Psychiatric/Behavioral: Negative.   All other systems reviewed and are negative.      Objective:   Physical Exam  Constitutional: She is oriented to person, place, and time. She appears well-developed and well-nourished.  HENT:  Nose: Nose normal.  Mouth/Throat: Oropharynx is clear and moist.  Eyes: EOM are normal.  Neck: Trachea normal, normal range of motion and full passive range of motion without pain. Neck supple. No JVD present. Carotid bruit is not present. No thyromegaly present.  Cardiovascular: Normal rate, regular rhythm, normal heart sounds and intact distal pulses.  Exam reveals no gallop and no friction rub.   No murmur heard. Pulmonary/Chest: Effort normal and breath sounds normal.  Abdominal: Soft. Bowel sounds are normal. She exhibits no distension and no mass. There is no tenderness.  Musculoskeletal: Normal range of motion.  Lymphadenopathy:    She has no cervical adenopathy.  Neurological: She is alert and oriented to person, place, and time. She has normal reflexes.  Skin: Skin is warm and dry.  Psychiatric: She has a normal mood and  affect. Her behavior is normal. Judgment and thought content normal.   BP 111/63 (BP Location: Left Arm)   Pulse 99   Temp 98.9 F (37.2 C) (Oral)   Ht '5\' 8"'  (1.727 m)   Wt 272 lb 9.6 oz (123.7 kg)   BMI 41.45 kg/m      Assessment & Plan:  1. 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 - Bayer DCA Hb A1c Waived - LEVEMIR FLEXTOUCH 100 UNIT/ML Pen; INJECT 80 UNITS INTO THE SKIN 2 (TWO) TIMES DAILY.  Dispense: 150 mL; Refill: 1 - insulin lispro (HUMALOG KWIKPEN) 100 UNIT/ML KiwkPen; USE 40 UNITS 3 TIMES A DAY  Dispense: 135 mL; Refill: 5  2. Essential hypertension Do not add salt to diet - CMP14+EGFR - lisinopril (PRINIVIL,ZESTRIL) 20 MG tablet; Take 1 tablet (20 mg total) by mouth 2 (two) times daily.  Dispense: 180 tablet; Refill: 1  3. Hyperlipemia Low fta diet - Lipid panel  4. Gastroesophageal reflux disease without esophagitis Avoid spicy foods Do not eat 2 hours prior to bedtime  5. Morbid obesity, unspecified obesity type ([place]) Discussed diet and exercise for person with BMI >25 Will recheck weight in 3-6 months  6. Peripheral edema Elevate legs when  sitting - furosemide (LASIX) 40 MG tablet; Take 1 tablet (40 mg total) by mouth daily.  Dispense: 90 tablet; Refill: 1    Labs pending Health maintenance reviewed Diet and exercise encouraged Continue all meds Follow up  In 3 months   [place], [name]

## 2015-09-27 NOTE — Patient Instructions (Signed)
Diabetes and Foot Care Diabetes may cause you to have problems because of poor blood supply (circulation) to your feet and legs. This may cause the skin on your feet to become thinner, break easier, and heal more slowly. Your skin may become dry, and the skin may peel and crack. You may also have nerve damage in your legs and feet causing decreased feeling in them. You may not notice minor injuries to your feet that could lead to infections or more serious problems. Taking care of your feet is one of the most important things you can do for yourself.  HOME CARE INSTRUCTIONS  Wear shoes at all times, even in the house. Do not go barefoot. Bare feet are easily injured.  Check your feet daily for blisters, cuts, and redness. If you cannot see the bottom of your feet, use a mirror or ask someone for help.  Wash your feet with warm water (do not use hot water) and mild soap. Then pat your feet and the areas between your toes until they are completely dry. Do not soak your feet as this can dry your skin.  Apply a moisturizing lotion or petroleum jelly (that does not contain alcohol and is unscented) to the skin on your feet and to dry, brittle toenails. Do not apply lotion between your toes.  Trim your toenails straight across. Do not dig under them or around the cuticle. File the edges of your nails with an emery board or nail file.  Do not cut corns or calluses or try to remove them with medicine.  Wear clean socks or stockings every day. Make sure they are not too tight. Do not wear knee-high stockings since they may decrease blood flow to your legs.  Wear shoes that fit properly and have enough cushioning. To break in new shoes, wear them for just a few hours a day. This prevents you from injuring your feet. Always look in your shoes before you put them on to be sure there are no objects inside.  Do not cross your legs. This may decrease the blood flow to your feet.  If you find a minor scrape,  cut, or break in the skin on your feet, keep it and the skin around it clean and dry. These areas may be cleansed with mild soap and water. Do not cleanse the area with peroxide, alcohol, or iodine.  When you remove an adhesive bandage, be sure not to damage the skin around it.  If you have a wound, look at it several times a day to make sure it is healing.  Do not use heating pads or hot water bottles. They may burn your skin. If you have lost feeling in your feet or legs, you may not know it is happening until it is too late.  Make sure your health care provider performs a complete foot exam at least annually or more often if you have foot problems. Report any cuts, sores, or bruises to your health care provider immediately. SEEK MEDICAL CARE IF:   You have an injury that is not healing.  You have cuts or breaks in the skin.  You have an ingrown nail.  You notice redness on your legs or feet.  You feel burning or tingling in your legs or feet.  You have pain or cramps in your legs and feet.  Your legs or feet are numb.  Your feet always feel cold. SEEK IMMEDIATE MEDICAL CARE IF:   There is increasing redness,   swelling, or pain in or around a wound.  There is a red line that goes up your leg.  Pus is coming from a wound.  You develop a fever or as directed by your health care provider.  You notice a bad smell coming from an ulcer or wound.   This information is not intended to replace advice given to you by your health care provider. Make sure you discuss any questions you have with your health care provider.   Document Released: 02/18/2000 Document Revised: 10/23/2012 Document Reviewed: 07/30/2012 Elsevier Interactive Patient Education 2016 [employer] Inc[employer]  

## 2015-09-28 LAB — LIPID PANEL
Chol/HDL Ratio: 8.1 ratio units — ABNORMAL HIGH (ref 0.0–4.4)
Cholesterol, Total: 236 mg/dL — ABNORMAL HIGH (ref 100–199)
HDL: 29 mg/dL — ABNORMAL LOW (ref >39)
LDL Calculated: 129 mg/dL — ABNORMAL HIGH (ref 0–99)
Triglycerides: 391 mg/dL — ABNORMAL HIGH (ref 0–149)
VLDL Cholesterol Cal: 78 mg/dL — ABNORMAL HIGH (ref 5–40)

## 2015-09-28 LAB — CMP14+EGFR
ALT: 44 IU/L — ABNORMAL HIGH (ref 0–32)
AST: 57 IU/L — ABNORMAL HIGH (ref 0–40)
Albumin/Globulin Ratio: 1 — ABNORMAL LOW (ref 1.2–2.2)
Albumin: 3.5 g/dL — ABNORMAL LOW (ref 3.6–4.8)
Alkaline Phosphatase: 94 IU/L (ref 39–117)
BUN/Creatinine Ratio: 14 (ref 12–28)
BUN: 12 mg/dL (ref 8–27)
Bilirubin Total: 0.4 mg/dL (ref 0.0–1.2)
CO2: 24 mmol/L (ref 18–29)
Calcium: 9.3 mg/dL (ref 8.7–10.3)
Chloride: 97 mmol/L (ref 96–106)
Creatinine, Ser: 0.83 mg/dL (ref 0.57–1.00)
GFR calc Af Amer: 86 mL/min/{1.73_m2} (ref >59)
GFR calc non Af Amer: 74 mL/min/{1.73_m2} (ref >59)
Globulin, Total: 3.6 g/dL (ref 1.5–4.5)
Glucose: 206 mg/dL — ABNORMAL HIGH (ref 65–99)
Potassium: 3.7 mmol/L (ref 3.5–5.2)
Sodium: 137 mmol/L (ref 134–144)
Total Protein: 7.1 g/dL (ref 6.0–8.5)

## 2015-10-15 ENCOUNTER — Encounter (INDEPENDENT_AMBULATORY_CARE_PROVIDER_SITE_OTHER): Payer: Medicare Other | Admitting: Ophthalmology

## 2015-10-15 DIAGNOSIS — D3132 Benign neoplasm of left choroid: Secondary | ICD-10-CM

## 2015-10-15 DIAGNOSIS — E11311 Type 2 diabetes mellitus with unspecified diabetic retinopathy with macular edema: Secondary | ICD-10-CM | POA: Diagnosis not present

## 2015-10-15 DIAGNOSIS — H35033 Hypertensive retinopathy, bilateral: Secondary | ICD-10-CM | POA: Diagnosis not present

## 2015-10-15 DIAGNOSIS — E113513 Type 2 diabetes mellitus with proliferative diabetic retinopathy with macular edema, bilateral: Secondary | ICD-10-CM

## 2015-10-15 DIAGNOSIS — H43813 Vitreous degeneration, bilateral: Secondary | ICD-10-CM | POA: Diagnosis not present

## 2015-10-15 DIAGNOSIS — I10 Essential (primary) hypertension: Secondary | ICD-10-CM

## 2015-12-03 ENCOUNTER — Encounter (INDEPENDENT_AMBULATORY_CARE_PROVIDER_SITE_OTHER): Payer: Medicare Other | Admitting: Ophthalmology

## 2015-12-03 DIAGNOSIS — E11311 Type 2 diabetes mellitus with unspecified diabetic retinopathy with macular edema: Secondary | ICD-10-CM | POA: Diagnosis not present

## 2015-12-03 DIAGNOSIS — I10 Essential (primary) hypertension: Secondary | ICD-10-CM | POA: Diagnosis not present

## 2015-12-03 DIAGNOSIS — H43813 Vitreous degeneration, bilateral: Secondary | ICD-10-CM

## 2015-12-03 DIAGNOSIS — E113513 Type 2 diabetes mellitus with proliferative diabetic retinopathy with macular edema, bilateral: Secondary | ICD-10-CM | POA: Diagnosis not present

## 2015-12-03 DIAGNOSIS — D3132 Benign neoplasm of left choroid: Secondary | ICD-10-CM

## 2015-12-03 DIAGNOSIS — H35033 Hypertensive retinopathy, bilateral: Secondary | ICD-10-CM

## 2015-12-26 ENCOUNTER — Other Ambulatory Visit: Payer: Self-pay | Admitting: Nurse Practitioner

## 2015-12-26 DIAGNOSIS — IMO0002 Reserved for concepts with insufficient information to code with codable children: Secondary | ICD-10-CM

## 2015-12-26 DIAGNOSIS — E1136 Type 2 diabetes mellitus with diabetic cataract: Secondary | ICD-10-CM

## 2015-12-26 DIAGNOSIS — E1165 Type 2 diabetes mellitus with hyperglycemia: Secondary | ICD-10-CM

## 2015-12-28 ENCOUNTER — Other Ambulatory Visit: Payer: Self-pay | Admitting: Nurse Practitioner

## 2015-12-28 ENCOUNTER — Encounter: Payer: Self-pay | Admitting: Nurse Practitioner

## 2015-12-28 ENCOUNTER — Ambulatory Visit (INDEPENDENT_AMBULATORY_CARE_PROVIDER_SITE_OTHER): Payer: Medicare Other | Admitting: Nurse Practitioner

## 2015-12-28 VITALS — BP 139/81 | HR 76 | Temp 98.4°F | Ht 68.0 in | Wt 273.0 lb

## 2015-12-28 DIAGNOSIS — IMO0002 Reserved for concepts with insufficient information to code with codable children: Secondary | ICD-10-CM

## 2015-12-28 DIAGNOSIS — I10 Essential (primary) hypertension: Secondary | ICD-10-CM

## 2015-12-28 DIAGNOSIS — E782 Mixed hyperlipidemia: Secondary | ICD-10-CM | POA: Diagnosis not present

## 2015-12-28 DIAGNOSIS — E1165 Type 2 diabetes mellitus with hyperglycemia: Secondary | ICD-10-CM

## 2015-12-28 DIAGNOSIS — Z794 Long term (current) use of insulin: Secondary | ICD-10-CM

## 2015-12-28 DIAGNOSIS — R609 Edema, unspecified: Secondary | ICD-10-CM

## 2015-12-28 DIAGNOSIS — Z23 Encounter for immunization: Secondary | ICD-10-CM | POA: Diagnosis not present

## 2015-12-28 DIAGNOSIS — R600 Localized edema: Secondary | ICD-10-CM

## 2015-12-28 DIAGNOSIS — E1136 Type 2 diabetes mellitus with diabetic cataract: Secondary | ICD-10-CM

## 2015-12-28 DIAGNOSIS — K219 Gastro-esophageal reflux disease without esophagitis: Secondary | ICD-10-CM | POA: Diagnosis not present

## 2015-12-28 LAB — BAYER DCA HB A1C WAIVED: HB A1C (BAYER DCA - WAIVED): 10 % — ABNORMAL HIGH (ref <7.0)

## 2015-12-28 MED ORDER — GLUCOSE BLOOD VI STRP: ORAL_STRIP | 12 refills | Status: DC

## 2015-12-28 MED ORDER — ONETOUCH ULTRASOFT LANCETS MISC: 3 refills | Status: DC

## 2015-12-28 MED ORDER — INSULIN PEN NEEDLE 30G X 8 MM MISC: 2 refills | Status: DC

## 2015-12-28 NOTE — Progress Notes (Signed)
Subjective:    Patient ID: Emma Griffin, female    DOB: 11-16-1950, 65 y.o.   MRN: [id number]  Patient here today for follow up of chronic medical problems. Hgba1c was elevated at last visit. Patient refused to make changes to diabetic meds. There has ben no change to blood sugars.  Outpatient Encounter Prescriptions as of 12/28/2015  Medication Sig  . Ascorbic Acid (VITAMIN C) 1000 MG tablet Take 1,000 mg by mouth daily.  Marland Kitchen aspirin 81 MG chewable tablet Chew by mouth.  Marland Kitchen atorvastatin (LIPITOR) 80 MG tablet Take by mouth.  . bevacizumab (AVASTIN) 1.25 mg/0.1 mL SOLN Apply 1.25 mg to eye See admin instructions. Opthalmic injection every 4-6 weeks  . Calcium Carbonate-Vitamin D (CVS CALCIUM/VIT D SOFT CHEWS PO) Take by mouth.  . fluoruracil (CARAC) 0.5 % cream APPLY TOPICALLY DAILY.  . furosemide (LASIX) 40 MG tablet Take 1 tablet (40 mg total) by mouth daily.  Marland Kitchen gatifloxacin (ZYMAR) 0.3 % ophthalmic drops Place 1 drop into the right eye 4 (four) times daily. Used for 2 days after surgery  . glucose blood (ONE TOUCH ULTRA TEST) test strip Test 4x a day and prn  Dx. e11.36  . HUMALOG KWIKPEN 100 UNIT/ML KiwkPen USE 40 UNITS 3 TIMES A DAY  . ibuprofen (ADVIL,MOTRIN) 200 MG tablet Take 400-600 mg by mouth every 6 (six) hours as needed. For pain/cramps  . Insulin Pen Needle (NOVOFINE) 30G X 8 MM MISC 5 SHOTS PER DAY AND USE WITH SLIDING SCALE  . Lancets (ONETOUCH ULTRASOFT) lancets TEST FOUR TIMES DAILY  . LEVEMIR FLEXTOUCH 100 UNIT/ML Pen INJECT 80 UNITS INTO THE SKIN 2 (TWO) TIMES DAILY.  Marland Kitchen lisinopril (PRINIVIL,ZESTRIL) 20 MG tablet Take 1 tablet (20 mg total) by mouth 2 (two) times daily.  . metoprolol succinate (TOPROL-XL) 100 MG 24 hr tablet Take by mouth.  . Multiple Vitamins-Minerals (AIRBORNE) CHEW Chew by mouth.  . nitroGLYCERIN (NITROSTAT) 0.4 MG SL tablet Place under the tongue.  . prasugrel (EFFIENT) 10 MG TABS tablet Take by mouth.  . prednisoLONE acetate (PRED FORTE) 1 %  ophthalmic suspension 1 drop 4 (four) times daily.  Marland Kitchen RA KRILL OIL 500 MG CAPS Take by mouth.  . [DISCONTINUED] insulin lispro (HUMALOG KWIKPEN) 100 UNIT/ML KiwkPen USE 40 UNITS 3 TIMES A DAY  . [DISCONTINUED] LEVEMIR FLEXTOUCH 100 UNIT/ML Pen INJECT 80 UNITS INTO THE SKIN 2 (TWO) TIMES DAILY.   No facility-administered encounter medications on file as of 12/28/2015.       Diabetes  She presents for her follow-up diabetic visit. She has type 2 diabetes mellitus. No MedicAlert identification noted. Her disease course has been fluctuating. There are no hypoglycemic associated symptoms. Pertinent negatives for diabetes include no chest pain and no fatigue. Symptoms are stable. Diabetic complications include retinopathy. Current diabetic treatment includes insulin injections (patient was suppose to increase levemir to 80u BID but she did  not increas at all. has remained on 70u bid.). She is compliant with treatment all of the time. Her weight is stable. She is following a high fat/cholesterol diet. When asked about meal planning, she reported none. She has not had a previous visit with a dietitian. She rarely participates in exercise. Her breakfast blood glucose is taken between 9-10 am. Her breakfast blood glucose range is generally >200 mg/dl. Her highest blood glucose is >200 mg/dl. Her overall blood glucose range is >200 mg/dl. An ACE inhibitor/angiotensin II receptor blocker is being taken. She does not see a podiatrist.Eye exam is not current.  Hypertension  This is a chronic problem. The current episode started more than 1 year ago. The problem is controlled. Pertinent negatives include no chest pain, palpitations or shortness of breath. Risk factors for coronary artery disease include diabetes mellitus, dyslipidemia, post-menopausal state, obesity and sedentary lifestyle. Past treatments include ACE inhibitors and diuretics. The current treatment provides moderate improvement. Compliance problems  include diet and exercise.  Hypertensive end-organ damage includes retinopathy.  Hyperlipidemia  This is a chronic problem. The current episode started more than 1 year ago. The problem is controlled. Recent lipid tests were reviewed and are normal. Exacerbating diseases include diabetes and obesity. She has no history of hypothyroidism. Pertinent negatives include no chest pain or shortness of breath. The current treatment provides moderate improvement of lipids. Compliance problems include adherence to diet and adherence to exercise.  Risk factors for coronary artery disease include dyslipidemia, diabetes mellitus, hypertension, post-menopausal and obesity.  palpitations Patient c/o palpitations only lasted a couple of weeks- not having now. GERD Patient has stopped her protonix which she thinks is causing her palpitations. Has occasional heart burn symptoms. No more belching.    Review of Systems  Constitutional: Negative for fatigue.  HENT: Negative.   Respiratory: Negative for shortness of breath.   Cardiovascular: Negative for chest pain and palpitations.  Genitourinary: Negative.   Neurological: Negative.   Psychiatric/Behavioral: Negative.   All other systems reviewed and are negative.      Objective:   Physical Exam  Constitutional: She is oriented to person, place, and time. She appears well-developed and well-nourished.  HENT:  Nose: Nose normal.  Mouth/Throat: Oropharynx is clear and moist.  Eyes: EOM are normal.  Neck: Trachea normal, normal range of motion and full passive range of motion without pain. Neck supple. No JVD present. Carotid bruit is not present. No thyromegaly present.  Cardiovascular: Normal rate, regular rhythm, normal heart sounds and intact distal pulses.  Exam reveals no gallop and no friction rub.   No murmur heard. Pulmonary/Chest: Effort normal and breath sounds normal.  Abdominal: Soft. Bowel sounds are normal. She exhibits no distension and no  mass. There is no tenderness.  Musculoskeletal: Normal range of motion.  Lymphadenopathy:    She has no cervical adenopathy.  Neurological: She is alert and oriented to person, place, and time. She has normal reflexes.  Skin: Skin is warm and dry.  Psychiatric: She has a normal mood and affect. Her behavior is normal. Judgment and thought content normal.   BP 139/81   Pulse 76   Temp 98.4 F (36.9 C) (Oral)   Ht 5\' 8"  (1.727 m)   Wt 273 lb (123.8 kg)   BMI 41.51 kg/m   hgba1c-10.0 % down from 10.2% at last visit   Assessment & Plan:  1. Essential hypertension Do not add salt to diet  2. Gastroesophageal reflux disease without esophagitis Avoid spicy foods Do not eat 2 hours prior to bedtime  3. 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 Mixed hyperlipidemia Low fat diet  5. Peripheral edema Elevate legs when sitting  6. Severe obesity (BMI >= 40) (HCC) Discussed diet and exercise for person with BMI >25 Will recheck weight in 3-6 months     Labs pending Health maintenance reviewed Diet and exercise encouraged Continue all meds Follow up  In 3 month   [place], [name]

## 2015-12-28 NOTE — Telephone Encounter (Signed)
Supplies sent in, patient informed

## 2015-12-28 NOTE — Addendum Note (Signed)
Addended by: Rolena Infante on: 12/28/2015 04:01 PM   Modules accepted: Orders

## 2015-12-28 NOTE — Patient Instructions (Signed)
Diabetes and Foot Care Diabetes may cause you to have problems because of poor blood supply (circulation) to your feet and legs. This may cause the skin on your feet to become thinner, break easier, and heal more slowly. Your skin may become dry, and the skin may peel and crack. You may also have nerve damage in your legs and feet causing decreased feeling in them. You may not notice minor injuries to your feet that could lead to infections or more serious problems. Taking care of your feet is one of the most important things you can do for yourself.  HOME CARE INSTRUCTIONS  Wear shoes at all times, even in the house. Do not go barefoot. Bare feet are easily injured.  Check your feet daily for blisters, cuts, and redness. If you cannot see the bottom of your feet, use a mirror or ask someone for help.  Wash your feet with warm water (do not use hot water) and mild soap. Then pat your feet and the areas between your toes until they are completely dry. Do not soak your feet as this can dry your skin.  Apply a moisturizing lotion or petroleum jelly (that does not contain alcohol and is unscented) to the skin on your feet and to dry, brittle toenails. Do not apply lotion between your toes.  Trim your toenails straight across. Do not dig under them or around the cuticle. File the edges of your nails with an emery board or nail file.  Do not cut corns or calluses or try to remove them with medicine.  Wear clean socks or stockings every day. Make sure they are not too tight. Do not wear knee-high stockings since they may decrease blood flow to your legs.  Wear shoes that fit properly and have enough cushioning. To break in new shoes, wear them for just a few hours a day. This prevents you from injuring your feet. Always look in your shoes before you put them on to be sure there are no objects inside.  Do not cross your legs. This may decrease the blood flow to your feet.  If you find a minor scrape,  cut, or break in the skin on your feet, keep it and the skin around it clean and dry. These areas may be cleansed with mild soap and water. Do not cleanse the area with peroxide, alcohol, or iodine.  When you remove an adhesive bandage, be sure not to damage the skin around it.  If you have a wound, look at it several times a day to make sure it is healing.  Do not use heating pads or hot water bottles. They may burn your skin. If you have lost feeling in your feet or legs, you may not know it is happening until it is too late.  Make sure your health care provider performs a complete foot exam at least annually or more often if you have foot problems. Report any cuts, sores, or bruises to your health care provider immediately. SEEK MEDICAL CARE IF:   You have an injury that is not healing.  You have cuts or breaks in the skin.  You have an ingrown nail.  You notice redness on your legs or feet.  You feel burning or tingling in your legs or feet.  You have pain or cramps in your legs and feet.  Your legs or feet are numb.  Your feet always feel cold. SEEK IMMEDIATE MEDICAL CARE IF:   There is increasing redness,   swelling, or pain in or around a wound.  There is a red line that goes up your leg.  Pus is coming from a wound.  You develop a fever or as directed by your health care provider.  You notice a bad smell coming from an ulcer or wound.   This information is not intended to replace advice given to you by your health care provider. Make sure you discuss any questions you have with your health care provider.   Document Released: 02/18/2000 Document Revised: 10/23/2012 Document Reviewed: 07/30/2012 Elsevier Interactive Patient Education 2016 [employer] Inc[employer]  

## 2015-12-29 LAB — LIPID PANEL
Chol/HDL Ratio: 4.8 ratio units — ABNORMAL HIGH (ref 0.0–4.4)
Cholesterol, Total: 134 mg/dL (ref 100–199)
HDL: 28 mg/dL — ABNORMAL LOW (ref >39)
LDL Calculated: 55 mg/dL (ref 0–99)
Triglycerides: 253 mg/dL — ABNORMAL HIGH (ref 0–149)
VLDL Cholesterol Cal: 51 mg/dL — ABNORMAL HIGH (ref 5–40)

## 2015-12-29 LAB — CMP14+EGFR
ALT: 21 IU/L (ref 0–32)
AST: 16 IU/L (ref 0–40)
Albumin/Globulin Ratio: 1.2 (ref 1.2–2.2)
Albumin: 3.7 g/dL (ref 3.6–4.8)
Alkaline Phosphatase: 127 IU/L — ABNORMAL HIGH (ref 39–117)
BUN/Creatinine Ratio: 23 (ref 12–28)
BUN: 19 mg/dL (ref 8–27)
Bilirubin Total: 0.4 mg/dL (ref 0.0–1.2)
CO2: 27 mmol/L (ref 18–29)
Calcium: 8.8 mg/dL (ref 8.7–10.3)
Chloride: 98 mmol/L (ref 96–106)
Creatinine, Ser: 0.83 mg/dL (ref 0.57–1.00)
GFR calc Af Amer: 85 mL/min/{1.73_m2} (ref >59)
GFR calc non Af Amer: 74 mL/min/{1.73_m2} (ref >59)
Globulin, Total: 3.2 g/dL (ref 1.5–4.5)
Glucose: 344 mg/dL — ABNORMAL HIGH (ref 65–99)
Potassium: 4.6 mmol/L (ref 3.5–5.2)
Sodium: 137 mmol/L (ref 134–144)
Total Protein: 6.9 g/dL (ref 6.0–8.5)

## 2016-01-05 ENCOUNTER — Ambulatory Visit: Payer: Medicare Other | Admitting: Pharmacist

## 2016-01-21 ENCOUNTER — Encounter (INDEPENDENT_AMBULATORY_CARE_PROVIDER_SITE_OTHER): Payer: Medicare Other | Admitting: Ophthalmology

## 2016-01-21 DIAGNOSIS — H2513 Age-related nuclear cataract, bilateral: Secondary | ICD-10-CM | POA: Diagnosis not present

## 2016-01-21 DIAGNOSIS — I10 Essential (primary) hypertension: Secondary | ICD-10-CM

## 2016-01-21 DIAGNOSIS — H35033 Hypertensive retinopathy, bilateral: Secondary | ICD-10-CM

## 2016-01-21 DIAGNOSIS — E11311 Type 2 diabetes mellitus with unspecified diabetic retinopathy with macular edema: Secondary | ICD-10-CM

## 2016-01-21 DIAGNOSIS — D3132 Benign neoplasm of left choroid: Secondary | ICD-10-CM

## 2016-01-21 DIAGNOSIS — E113513 Type 2 diabetes mellitus with proliferative diabetic retinopathy with macular edema, bilateral: Secondary | ICD-10-CM | POA: Diagnosis not present

## 2016-01-21 DIAGNOSIS — H43813 Vitreous degeneration, bilateral: Secondary | ICD-10-CM | POA: Diagnosis not present

## 2016-02-11 ENCOUNTER — Encounter (INDEPENDENT_AMBULATORY_CARE_PROVIDER_SITE_OTHER): Payer: Medicare Other | Admitting: Ophthalmology

## 2016-02-11 DIAGNOSIS — I10 Essential (primary) hypertension: Secondary | ICD-10-CM

## 2016-02-11 DIAGNOSIS — E113513 Type 2 diabetes mellitus with proliferative diabetic retinopathy with macular edema, bilateral: Secondary | ICD-10-CM | POA: Diagnosis not present

## 2016-02-11 DIAGNOSIS — H35033 Hypertensive retinopathy, bilateral: Secondary | ICD-10-CM

## 2016-02-11 DIAGNOSIS — E11311 Type 2 diabetes mellitus with unspecified diabetic retinopathy with macular edema: Secondary | ICD-10-CM

## 2016-02-11 DIAGNOSIS — H43813 Vitreous degeneration, bilateral: Secondary | ICD-10-CM

## 2016-02-11 DIAGNOSIS — D3132 Benign neoplasm of left choroid: Secondary | ICD-10-CM

## 2016-03-06 IMAGING — CR DG CHEST 2V
2 series · 2 of 2 positions shown · non-contrast
Comparison: None.

CLINICAL DATA: Shortness of breath.  Cough.

EXAM:
CHEST  2 VIEW

[view not recorded (1 of 2)]
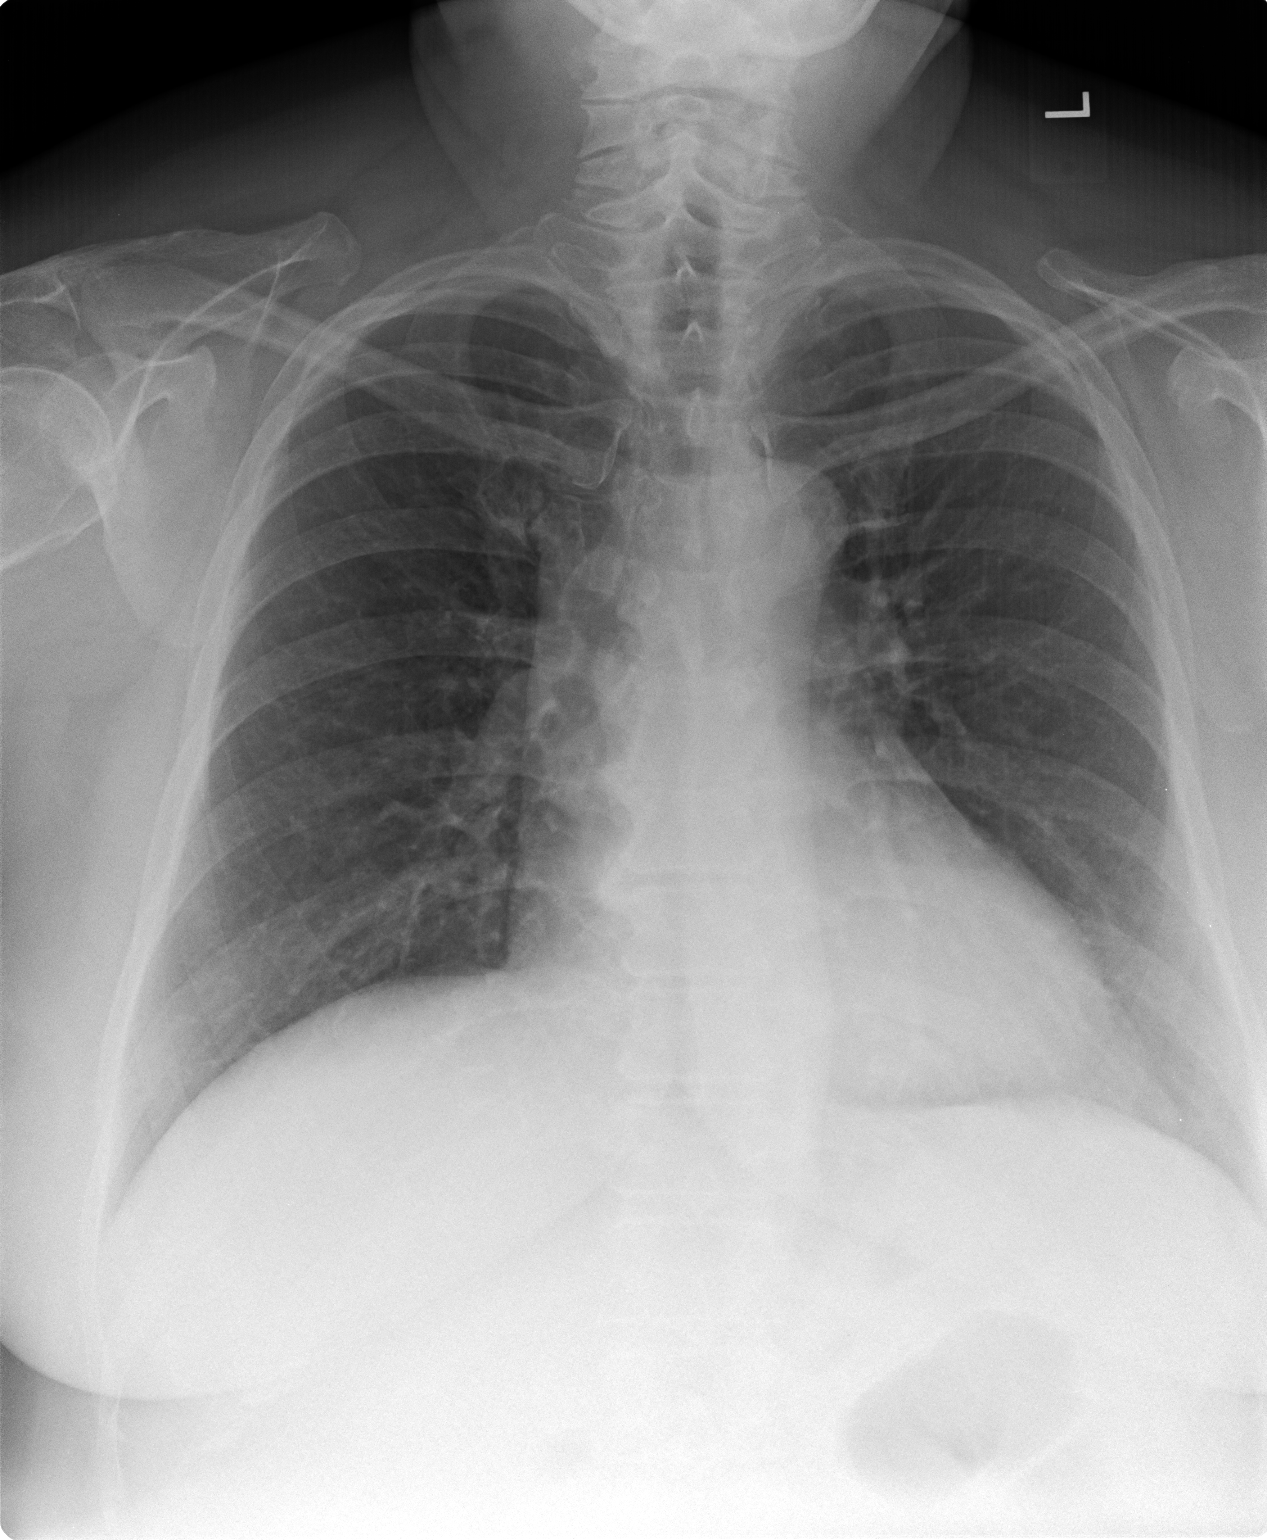

[view not recorded (2 of 2)]
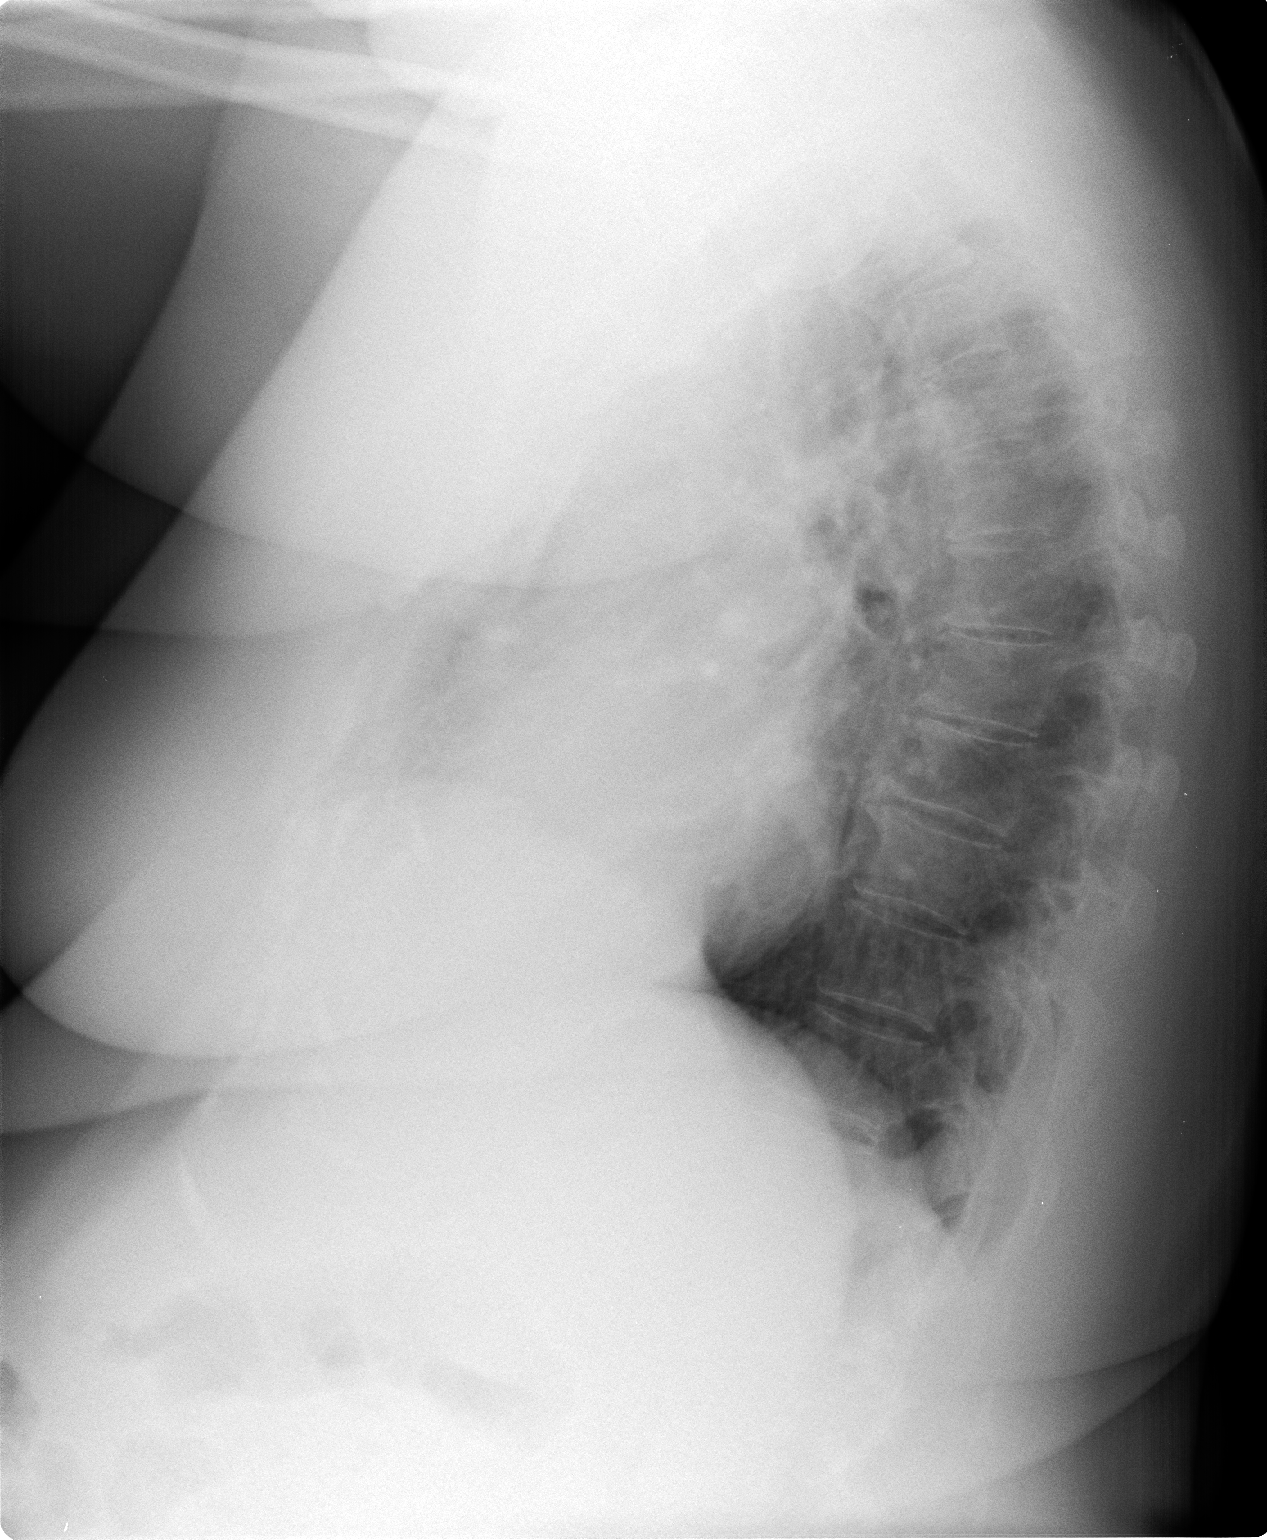

[2 of 2 positions shown; findings below may reference images not displayed]

FINDINGS: Mediastinum and hilar structures normal. Cardiomegaly. No pulmonary
venous congestion. No focal infiltrate or pleural effusion. No
pneumothorax. No acute bony abnormality .
IMPRESSION: Cardiomegaly. No overt congestive heart failure. No acute pulmonary
infiltrate .

## 2016-03-08 NOTE — Progress Notes (Signed)
Patient says she will try to do sometime soon.

## 2016-03-16 ENCOUNTER — Telehealth: Payer: Self-pay | Admitting: Nurse Practitioner

## 2016-03-16 ENCOUNTER — Other Ambulatory Visit: Payer: Self-pay | Admitting: Nurse Practitioner

## 2016-03-16 DIAGNOSIS — E1165 Type 2 diabetes mellitus with hyperglycemia: Secondary | ICD-10-CM

## 2016-03-16 DIAGNOSIS — E1136 Type 2 diabetes mellitus with diabetic cataract: Secondary | ICD-10-CM

## 2016-03-16 DIAGNOSIS — IMO0002 Reserved for concepts with insufficient information to code with codable children: Secondary | ICD-10-CM

## 2016-03-16 NOTE — Telephone Encounter (Signed)
Patient aware that rx sent to pharmacy. 

## 2016-03-17 ENCOUNTER — Encounter (INDEPENDENT_AMBULATORY_CARE_PROVIDER_SITE_OTHER): Payer: Medicare Other | Admitting: Ophthalmology

## 2016-03-17 DIAGNOSIS — H35033 Hypertensive retinopathy, bilateral: Secondary | ICD-10-CM

## 2016-03-17 DIAGNOSIS — H2513 Age-related nuclear cataract, bilateral: Secondary | ICD-10-CM | POA: Diagnosis not present

## 2016-03-17 DIAGNOSIS — E11311 Type 2 diabetes mellitus with unspecified diabetic retinopathy with macular edema: Secondary | ICD-10-CM

## 2016-03-17 DIAGNOSIS — I10 Essential (primary) hypertension: Secondary | ICD-10-CM

## 2016-03-17 DIAGNOSIS — D3132 Benign neoplasm of left choroid: Secondary | ICD-10-CM | POA: Diagnosis not present

## 2016-03-17 DIAGNOSIS — H43813 Vitreous degeneration, bilateral: Secondary | ICD-10-CM

## 2016-03-17 DIAGNOSIS — E113513 Type 2 diabetes mellitus with proliferative diabetic retinopathy with macular edema, bilateral: Secondary | ICD-10-CM

## 2016-03-17 LAB — HM DIABETES EYE EXAM

## 2016-03-22 ENCOUNTER — Other Ambulatory Visit: Payer: Self-pay | Admitting: Nurse Practitioner

## 2016-03-22 DIAGNOSIS — I10 Essential (primary) hypertension: Secondary | ICD-10-CM

## 2016-03-30 ENCOUNTER — Ambulatory Visit (INDEPENDENT_AMBULATORY_CARE_PROVIDER_SITE_OTHER): Payer: Medicare Other | Admitting: Nurse Practitioner

## 2016-03-30 ENCOUNTER — Encounter: Payer: Self-pay | Admitting: Nurse Practitioner

## 2016-03-30 VITALS — BP 132/71 | HR 76 | Temp 98.7°F | Ht 68.0 in | Wt 283.0 lb

## 2016-03-30 DIAGNOSIS — E785 Hyperlipidemia, unspecified: Secondary | ICD-10-CM | POA: Diagnosis not present

## 2016-03-30 DIAGNOSIS — E1136 Type 2 diabetes mellitus with diabetic cataract: Secondary | ICD-10-CM | POA: Diagnosis not present

## 2016-03-30 DIAGNOSIS — E1165 Type 2 diabetes mellitus with hyperglycemia: Secondary | ICD-10-CM

## 2016-03-30 DIAGNOSIS — I10 Essential (primary) hypertension: Secondary | ICD-10-CM

## 2016-03-30 DIAGNOSIS — IMO0002 Reserved for concepts with insufficient information to code with codable children: Secondary | ICD-10-CM

## 2016-03-30 DIAGNOSIS — R609 Edema, unspecified: Secondary | ICD-10-CM

## 2016-03-30 DIAGNOSIS — K219 Gastro-esophageal reflux disease without esophagitis: Secondary | ICD-10-CM

## 2016-03-30 LAB — BAYER DCA HB A1C WAIVED: HB A1C (BAYER DCA - WAIVED): 11.9 % — ABNORMAL HIGH (ref <7.0)

## 2016-03-30 MED ORDER — PRASUGREL HCL 10 MG PO TABS: 10.0000 mg | ORAL_TABLET | Freq: Every day | ORAL | 5 refills | Status: DC

## 2016-03-30 MED ORDER — INSULIN LISPRO 100 UNIT/ML (KWIKPEN): PEN_INJECTOR | SUBCUTANEOUS | 1 refills | Status: DC

## 2016-03-30 MED ORDER — LISINOPRIL 20 MG PO TABS: 20.0000 mg | ORAL_TABLET | Freq: Two times a day (BID) | ORAL | 1 refills | Status: DC

## 2016-03-30 MED ORDER — ATORVASTATIN CALCIUM 80 MG PO TABS: 80.0000 mg | ORAL_TABLET | Freq: Every day | ORAL | 11 refills | Status: DC

## 2016-03-30 MED ORDER — FUROSEMIDE 40 MG PO TABS: 40.0000 mg | ORAL_TABLET | Freq: Every day | ORAL | 1 refills | Status: DC

## 2016-03-30 MED ORDER — INSULIN DETEMIR 100 UNIT/ML FLEXPEN: PEN_INJECTOR | SUBCUTANEOUS | 1 refills | Status: DC

## 2016-03-30 MED ORDER — METOPROLOL SUCCINATE ER 100 MG PO TB24: 100.0000 mg | ORAL_TABLET | Freq: Every day | ORAL | 5 refills | Status: DC

## 2016-03-30 NOTE — Progress Notes (Signed)
Subjective:    Patient ID: Emma Griffin, female    DOB: 01/23/1951, 66 y.o.   MRN: [id number]  Patient here today for follow up of chronic medical problems. No complaints today. Patient says that she has been sharing her humalog with her husband due to expense of medication. SO she has not been taking her full dose.  Outpatient Encounter Prescriptions as of 03/30/2016  Medication Sig  . Ascorbic Acid (VITAMIN C) 1000 MG tablet Take 1,000 mg by mouth daily.  Marland Kitchen aspirin 81 MG chewable tablet Chew by mouth.  Marland Kitchen atorvastatin (LIPITOR) 80 MG tablet Take by mouth.  . bevacizumab (AVASTIN) 1.25 mg/0.1 mL SOLN Apply 1.25 mg to eye See admin instructions. Opthalmic injection every 4-6 weeks  . Calcium Carbonate-Vitamin D ([facility] CALCIUM/VIT D SOFT CHEWS PO) Take by mouth.  . fluoruracil (CARAC) 0.5 % cream APPLY TOPICALLY DAILY.  . furosemide (LASIX) 40 MG tablet Take 1 tablet (40 mg total) by mouth daily.  Marland Kitchen gatifloxacin (ZYMAR) 0.3 % ophthalmic drops Place 1 drop into the right eye 4 (four) times daily. Used for 2 days after surgery  . glucose blood (ONE TOUCH ULTRA TEST) test strip Test 4x a day and prn  Dx. e11.36  . HUMALOG KWIKPEN 100 UNIT/ML KiwkPen INJECT 40 UNITS UNDER THE SKIN 3 TIMES A DAY  . ibuprofen (ADVIL,MOTRIN) 200 MG tablet Take 400-600 mg by mouth every 6 (six) hours as needed. For pain/cramps  . Insulin Pen Needle (NOVOFINE) 30G X 8 MM MISC 5 SHOTS PER DAY AND USE WITH SLIDING SCALE  . Lancets (ONETOUCH ULTRASOFT) lancets TEST FOUR TIMES DAILY  . LEVEMIR FLEXTOUCH 100 UNIT/ML Pen INJECT 80 UNITS INTO THE SKIN 2 (TWO) TIMES DAILY.  Marland Kitchen LEVEMIR FLEXTOUCH 100 UNIT/ML Pen INJECT 80 UNITS INTO THE SKIN 2 (TWO) TIMES DAILY.  Marland Kitchen lisinopril (PRINIVIL,ZESTRIL) 20 MG tablet Take 1 tablet (20 mg total) by mouth 2 (two) times daily.  Marland Kitchen lisinopril (PRINIVIL,ZESTRIL) 20 MG tablet TAKE 1 TABLET BY MOUTH TWICE A DAY  . metoprolol succinate (TOPROL-XL) 100 MG 24 hr tablet Take by mouth.  . Multiple  Vitamins-Minerals (AIRBORNE) CHEW Chew by mouth.  . nitroGLYCERIN (NITROSTAT) 0.4 MG SL tablet Place under the tongue.  . prasugrel (EFFIENT) 10 MG TABS tablet Take by mouth.  . prednisoLONE acetate (PRED FORTE) 1 % ophthalmic suspension 1 drop 4 (four) times daily.  Marland Kitchen RA KRILL OIL 500 MG CAPS Take by mouth.   No facility-administered encounter medications on file as of 03/30/2016.       Diabetes  She presents for her follow-up diabetic visit. She has type 2 diabetes mellitus. No MedicAlert identification noted. Her disease course has been fluctuating (last hgba1c was >10. patient refused medication cahnges.). There are no hypoglycemic associated symptoms. Pertinent negatives for diabetes include no chest pain and no fatigue. Symptoms are stable. Diabetic complications include retinopathy. Current diabetic treatment includes insulin injections (patient was suppose to increase levemir to 80u BID but she did  not increas at all. has remained on 70u bid.). She is compliant with treatment all of the time. Her weight is stable. She is following a high fat/cholesterol (patient has been tryig to watch diet , but has not been doing very well.) diet. When asked about meal planning, she reported none. She has not had a previous visit with a dietitian. She rarely participates in exercise. Her breakfast blood glucose is taken between 9-10 am. Her breakfast blood glucose range is generally 180-200 mg/dl. Her highest blood glucose  is >200 mg/dl. Her overall blood glucose range is >200 mg/dl. An ACE inhibitor/angiotensin II receptor blocker is being taken. She does not see a podiatrist.Eye exam is not current.  Hypertension  This is a chronic problem. The current episode started more than 1 year ago. The problem is controlled. Pertinent negatives include no chest pain, palpitations or shortness of breath. Risk factors for coronary artery disease include diabetes mellitus, dyslipidemia, post-menopausal state, obesity  and sedentary lifestyle. Past treatments include ACE inhibitors and diuretics. The current treatment provides moderate improvement. Compliance problems include diet and exercise.  Hypertensive end-organ damage includes retinopathy.  Hyperlipidemia  This is a chronic problem. The current episode started more than 1 year ago. The problem is controlled. Recent lipid tests were reviewed and are normal. Exacerbating diseases include diabetes and obesity. She has no history of hypothyroidism. Pertinent negatives include no chest pain or shortness of breath. The current treatment provides moderate improvement of lipids. Compliance problems include adherence to diet and adherence to exercise.  Risk factors for coronary artery disease include dyslipidemia, diabetes mellitus, hypertension, post-menopausal and obesity.  palpitations Patient c/o palpitations only lasted a couple of weeks- not having now. GERD Patient has stopped her protonix which she thinks is causing her palpitations. Has occasional heart burn symptoms. No more belching.    Review of Systems  Constitutional: Negative for fatigue.  HENT: Negative.   Respiratory: Negative for shortness of breath.   Cardiovascular: Negative for chest pain and palpitations.  Genitourinary: Negative.   Neurological: Negative.   Psychiatric/Behavioral: Negative.   All other systems reviewed and are negative.      Objective:   Physical Exam  Constitutional: She is oriented to person, place, and time. She appears well-developed and well-nourished.  HENT:  Nose: Nose normal.  Mouth/Throat: Oropharynx is clear and moist.  Eyes: EOM are normal.  Neck: Trachea normal, normal range of motion and full passive range of motion without pain. Neck supple. No JVD present. Carotid bruit is not present. No thyromegaly present.  Cardiovascular: Normal rate, regular rhythm, normal heart sounds and intact distal pulses.  Exam reveals no gallop and no friction rub.   No  murmur heard. Pulmonary/Chest: Effort normal and breath sounds normal.  Abdominal: Soft. Bowel sounds are normal. She exhibits no distension and no mass. There is no tenderness.  Musculoskeletal: Normal range of motion.  Lymphadenopathy:    She has no cervical adenopathy.  Neurological: She is alert and oriented to person, place, and time. She has normal reflexes.  Skin: Skin is warm and dry.  Psychiatric: She has a normal mood and affect. Her behavior is normal. Judgment and thought content normal.   BP 132/71   Pulse 76   Temp 98.7 F (37.1 C) (Oral)   Ht _0  (1.727 m)   Wt 283 lb (128.4 kg)   SpO2 97%   BMI 43.03 kg/m    hgba1c-11.9% up from 10.2% at last visit   Assessment & Plan:  1. 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  Dispense: 150 mL; Refill: 1 - insulin lispro (HUMALOG KWIKPEN) 100 UNIT/ML KiwkPen; INJECT 40 UNITS UNDER THE SKIN 3 TIMES  A DAY  Dispense: 120 mL; Refill: 1  2. Essential hypertension Low sodium diet - CMP14+EGFR - lisinopril (PRINIVIL,ZESTRIL) 20 MG tablet; Take 1 tablet (20 mg total) by mouth 2 (two) times daily.  Dispense: 180 tablet; Refill: 1  3. Hyperlipidemia, unspecified hyperlipidemia type Low fat diet - Lipid panel - atorvastatin (LIPITOR) 80 MG tablet; Take 1 tablet (80 mg total) by mouth daily at 6 PM.  Dispense: 30 tablet; Refill: 11  4. Gastroesophageal reflux disease without esophagitis Avoid spicy foods Do not eat 2 hours prior to bedtime  5. Peripheral edema Elevate legs when sitting - furosemide (LASIX) 40 MG tablet; Take 1 tablet (40 mg total) by mouth  daily.  Dispense: 90 tablet; Refill: 1  6. Severe obesity (BMI >= 40) (HCC) Discussed diet and exercise for person with BMI >25 Will recheck weight in 3-6 months     Labs pending Health maintenance reviewed Diet and exercise encouraged Continue all meds Follow up  In 3 months   [place], [name]

## 2016-03-30 NOTE — Patient Instructions (Signed)
Carbohydrate Counting for Diabetes Mellitus, Adult Carbohydrate counting is a method for keeping track of how many carbohydrates you eat. Eating carbohydrates naturally increases the amount of sugar (glucose) in the blood. Counting how many carbohydrates you eat helps keep your blood glucose within normal limits, which helps you manage your diabetes (diabetes mellitus). It is important to know how many carbohydrates you can safely have in each meal. This is different for every person. A diet and nutrition specialist (registered dietitian) can help you make a meal plan and calculate how many carbohydrates you should have at each meal and snack. Carbohydrates are found in the following foods:  Grains, such as breads and cereals.  Dried beans and soy products.  Starchy vegetables, such as potatoes, peas, and corn.  Fruit and fruit juices.  Milk and yogurt.  Sweets and snack foods, such as cake, cookies, candy, chips, and soft drinks. How do I count carbohydrates? There are two ways to count carbohydrates in food. You can use either of the methods or a combination of both. Reading "Nutrition Facts" on packaged food  The "Nutrition Facts" list is included on the labels of almost all packaged foods and beverages in the [place] It includes:  The serving size.  Information about nutrients in each serving, including the grams (g) of carbohydrate per serving. To use the "Nutrition Facts":  Decide how many servings you will have.  Multiply the number of servings by the number of carbohydrates per serving.  The resulting number is the total amount of carbohydrates that you will be having. Learning standard serving sizes of other foods  When you eat foods containing carbohydrates that are not packaged or do not include "Nutrition Facts" on the label, you need to measure the servings in order to count the amount of carbohydrates:  Measure the foods that you will eat with a food scale or measuring  cup, if needed.  Decide how many standard-size servings you will eat.  Multiply the number of servings by 15. Most carbohydrate-rich foods have about 15 g of carbohydrates per serving.  For example, if you eat 8 oz (170 g) of strawberries, you will have eaten 2 servings and 30 g of carbohydrates (2 servings x 15 g = 30 g).  For foods that have more than one food mixed, such as soups and casseroles, you must count the carbohydrates in each food that is included. The following list contains standard serving sizes of common carbohydrate-rich foods. Each of these servings has about 15 g of carbohydrates:   hamburger bun or  English muffin.   oz (15 mL) syrup.   oz (14 g) jelly.  1 slice of bread.  1 six-inch tortilla.  3 oz (85 g) cooked rice or pasta.  4 oz (113 g) cooked dried beans.  4 oz (113 g) starchy vegetable, such as peas, corn, or potatoes.  4 oz (113 g) hot cereal.  4 oz (113 g) mashed potatoes or  of a large baked potato.  4 oz (113 g) canned or frozen fruit.  4 oz (120 mL) fruit juice.  4-6 crackers.  6 chicken nuggets.  6 oz (170 g) unsweetened dry cereal.  6 oz (170 g) plain fat-free yogurt or yogurt sweetened with artificial sweeteners.  8 oz (240 mL) milk.  8 oz (170 g) fresh fruit or one small piece of fruit.  24 oz (680 g) popped popcorn. Example of carbohydrate counting Sample meal  3 oz (85 g) chicken breast.  6 oz (  170 g) brown rice.  4 oz (113 g) corn.  8 oz (240 mL) milk.  8 oz (170 g) strawberries with sugar-free whipped topping. Carbohydrate calculation 1. Identify the foods that contain carbohydrates:  Rice.  Corn.  Milk.  Strawberries. 2. Calculate how many servings you have of each food:  2 servings rice.  1 serving corn.  1 serving milk.  1 serving strawberries. 3. Multiply each number of servings by 15 g:  2 servings rice x 15 g = 30 g.  1 serving corn x 15 g = 15 g.  1 serving milk x 15 g = 15  g.  1 serving strawberries x 15 g = 15 g. 4. Add together all of the amounts to find the total grams of carbohydrates eaten:  30 g + 15 g + 15 g + 15 g = 75 g of carbohydrates total. This information is not intended to replace advice given to you by your health care provider. Make sure you discuss any questions you have with your health care provider. Document Released: 02/20/2005 Document Revised: 09/10/2015 Document Reviewed: 08/04/2015 Elsevier Interactive Patient Education  2017 Elsevier Inc.  

## 2016-03-31 LAB — CMP14+EGFR
ALT: 24 IU/L (ref 0–32)
AST: 21 IU/L (ref 0–40)
Albumin/Globulin Ratio: 1.2 (ref 1.2–2.2)
Albumin: 3.8 g/dL (ref 3.6–4.8)
Alkaline Phosphatase: 125 IU/L — ABNORMAL HIGH (ref 39–117)
BUN/Creatinine Ratio: 19 (ref 12–28)
BUN: 16 mg/dL (ref 8–27)
Bilirubin Total: 0.4 mg/dL (ref 0.0–1.2)
CO2: 26 mmol/L (ref 18–29)
Calcium: 9.1 mg/dL (ref 8.7–10.3)
Chloride: 98 mmol/L (ref 96–106)
Creatinine, Ser: 0.84 mg/dL (ref 0.57–1.00)
GFR calc Af Amer: 84 mL/min/{1.73_m2} (ref >59)
GFR calc non Af Amer: 73 mL/min/{1.73_m2} (ref >59)
Globulin, Total: 3.2 g/dL (ref 1.5–4.5)
Glucose: 209 mg/dL — ABNORMAL HIGH (ref 65–99)
Potassium: 4.1 mmol/L (ref 3.5–5.2)
Sodium: 142 mmol/L (ref 134–144)
Total Protein: 7 g/dL (ref 6.0–8.5)

## 2016-03-31 LAB — MICROALBUMIN / CREATININE URINE RATIO
Creatinine, Urine: 10.7 mg/dL
Microalb/Creat Ratio: <28 mg/g creat (ref 0.0–30.0)
Microalbumin, Urine: <3 ug/mL

## 2016-03-31 LAB — LIPID PANEL
Chol/HDL Ratio: 4.4 ratio units (ref 0.0–4.4)
Cholesterol, Total: 135 mg/dL (ref 100–199)
HDL: 31 mg/dL — ABNORMAL LOW (ref >39)
LDL Calculated: 35 mg/dL (ref 0–99)
Triglycerides: 344 mg/dL — ABNORMAL HIGH (ref 0–149)
VLDL Cholesterol Cal: 69 mg/dL — ABNORMAL HIGH (ref 5–40)

## 2016-04-05 ENCOUNTER — Ambulatory Visit: Payer: Self-pay | Admitting: Pharmacist

## 2016-05-05 ENCOUNTER — Encounter (INDEPENDENT_AMBULATORY_CARE_PROVIDER_SITE_OTHER): Payer: Medicare Other | Admitting: Ophthalmology

## 2016-05-05 DIAGNOSIS — E11311 Type 2 diabetes mellitus with unspecified diabetic retinopathy with macular edema: Secondary | ICD-10-CM | POA: Diagnosis not present

## 2016-05-05 DIAGNOSIS — H35033 Hypertensive retinopathy, bilateral: Secondary | ICD-10-CM

## 2016-05-05 DIAGNOSIS — E113513 Type 2 diabetes mellitus with proliferative diabetic retinopathy with macular edema, bilateral: Secondary | ICD-10-CM

## 2016-05-05 DIAGNOSIS — I10 Essential (primary) hypertension: Secondary | ICD-10-CM

## 2016-05-05 DIAGNOSIS — H43813 Vitreous degeneration, bilateral: Secondary | ICD-10-CM

## 2016-05-05 DIAGNOSIS — D3132 Benign neoplasm of left choroid: Secondary | ICD-10-CM

## 2016-05-05 DIAGNOSIS — H2513 Age-related nuclear cataract, bilateral: Secondary | ICD-10-CM

## 2016-06-13 ENCOUNTER — Other Ambulatory Visit: Payer: Self-pay | Admitting: Nurse Practitioner

## 2016-06-13 DIAGNOSIS — E1136 Type 2 diabetes mellitus with diabetic cataract: Secondary | ICD-10-CM

## 2016-06-13 DIAGNOSIS — E1165 Type 2 diabetes mellitus with hyperglycemia: Secondary | ICD-10-CM

## 2016-06-13 DIAGNOSIS — IMO0002 Reserved for concepts with insufficient information to code with codable children: Secondary | ICD-10-CM

## 2016-06-23 ENCOUNTER — Encounter (INDEPENDENT_AMBULATORY_CARE_PROVIDER_SITE_OTHER): Payer: Medicare Other | Admitting: Ophthalmology

## 2016-06-23 DIAGNOSIS — E113513 Type 2 diabetes mellitus with proliferative diabetic retinopathy with macular edema, bilateral: Secondary | ICD-10-CM

## 2016-06-23 DIAGNOSIS — H43813 Vitreous degeneration, bilateral: Secondary | ICD-10-CM

## 2016-06-23 DIAGNOSIS — D3132 Benign neoplasm of left choroid: Secondary | ICD-10-CM

## 2016-06-23 DIAGNOSIS — I10 Essential (primary) hypertension: Secondary | ICD-10-CM

## 2016-06-23 DIAGNOSIS — E11311 Type 2 diabetes mellitus with unspecified diabetic retinopathy with macular edema: Secondary | ICD-10-CM

## 2016-06-23 DIAGNOSIS — H2513 Age-related nuclear cataract, bilateral: Secondary | ICD-10-CM

## 2016-06-23 DIAGNOSIS — H35033 Hypertensive retinopathy, bilateral: Secondary | ICD-10-CM | POA: Diagnosis not present

## 2016-06-29 ENCOUNTER — Ambulatory Visit (INDEPENDENT_AMBULATORY_CARE_PROVIDER_SITE_OTHER): Payer: Medicare Other | Admitting: Nurse Practitioner

## 2016-06-29 ENCOUNTER — Encounter: Payer: Self-pay | Admitting: Nurse Practitioner

## 2016-06-29 VITALS — BP 150/70 | HR 79 | Temp 98.4°F | Ht 68.0 in | Wt 287.0 lb

## 2016-06-29 DIAGNOSIS — R079 Chest pain, unspecified: Secondary | ICD-10-CM | POA: Diagnosis not present

## 2016-06-29 DIAGNOSIS — R109 Unspecified abdominal pain: Secondary | ICD-10-CM | POA: Diagnosis not present

## 2016-06-29 DIAGNOSIS — R10A2 Flank pain, left side: Secondary | ICD-10-CM

## 2016-06-29 MED ORDER — VALACYCLOVIR HCL 1 G PO TABS: 1000.0000 mg | ORAL_TABLET | Freq: Three times a day (TID) | ORAL | 0 refills | Status: DC

## 2016-06-29 NOTE — Progress Notes (Signed)
   Subjective:    Patient ID: Emma Griffin, female    DOB: [birth year], 66 y.o.   MRN: [id number]  Chest Pain   This is a new problem. The current episode started in the past 7 days (3 days ago). The onset quality is sudden. The problem occurs intermittently. The problem has been gradually worsening. The pain is present in the lateral region. The pain is at a severity of 4/10. The pain is moderate. The quality of the pain is described as numbness and burning. Radiates to: under left breast and around to left back. Associated symptoms include back pain. Pertinent negatives include no cough, fever, lower extremity edema, nausea, palpitations or vomiting. The pain is aggravated by nothing. She has tried nothing for the symptoms. Improvement on treatment: no treatment tried. Risk factors include obesity, post-menopausal, sedentary lifestyle and stress.  Her past medical history is significant for CAD, hypertension and MI.  Pertinent negatives for past medical history include no anxiety/panic attacks, no CHF and no seizures.  Her family medical history is significant for heart disease, hypertension and early MI.      Review of Systems  Constitutional: Negative for fever.  Respiratory: Negative for cough.   Cardiovascular: Positive for chest pain. Negative for palpitations.  Gastrointestinal: Negative for nausea and vomiting.  Musculoskeletal: Positive for back pain.  Neurological: Negative for seizures.       Objective:   Physical Exam  Constitutional: She is oriented to person, place, and time. She appears well-developed and well-nourished.  HENT:  Head: Normocephalic.  Mouth/Throat: Oropharynx is clear and moist.  Patient complains of teeth falling out.  She has noticed this in the past couple years.  Eyes: Pupils are equal, round, and reactive to light.  Neck: Normal range of motion.  Cardiovascular: Normal rate and regular rhythm.   Murmur heard. Pulmonary/Chest: Effort normal and  breath sounds normal.  Abdominal: Soft. Bowel sounds are normal.  Musculoskeletal: Normal range of motion.  Neurological: She is alert and oriented to person, place, and time.  Skin: Skin is warm and dry. No rash noted.  Psychiatric: She has a normal mood and affect. Her behavior is normal. Judgment and thought content normal.   BP (!) 150/70 (BP Location: Left Wrist, Patient Position: Sitting, Cuff Size: Normal)   Pulse 79   Temp 98.4 F (36.9 C) (Oral)   Ht 5\' 8"  (1.727 m)   Wt 287 lb (130.2 kg)   BMI 43.64 kg/m    EKG- Kerry Hough, [name]     Assessment & Plan:   1. Chest pain in adult   2. Left flank pain    Meds ordered this encounter  Medications  . valACYclovir (VALTREX) 1000 MG tablet    Sig: Take 1 tablet (1,000 mg total) by mouth 3 (three) times daily.    Dispense:  21 tablet    Refill:  0    Order Specific Question:   Supervising Provider    Answer:   VINCENT, CAROL L [[id number]]   Only fill valtrex rx if rash develops Tylenol for pain RTO prn  Mary-Margaret Hassell Done, [name]

## 2016-06-29 NOTE — Patient Instructions (Signed)
Shingles Shingles is an infection that causes a painful skin rash and fluid-filled blisters. Shingles is caused by the same virus that causes chickenpox. Shingles only develops in people who:  Have had chickenpox.  Have gotten the chickenpox vaccine. (This is rare.) The first symptoms of shingles may be itching, tingling, or pain in an area on your skin. A rash will follow in a few days or weeks. The rash is usually on one side of the body in a bandlike or beltlike pattern. Over time, the rash turns into fluid-filled blisters that break open, scab over, and dry up. Medicines may:  Help you manage pain.  Help you recover more quickly.  Help to prevent long-term problems. Follow these instructions at home: Medicines  Take medicines only as told by your doctor.  Apply an anti-itch or numbing cream to the affected area as told by your doctor. Blister and Rash Care  Take a cool bath or put cool compresses on the area of the rash or blisters as told by your doctor. This may help with pain and itching.  Keep your rash covered with a loose bandage (dressing). Wear loose-fitting clothing.  Keep your rash and blisters clean with mild soap and cool water or as told by your doctor.  Check your rash every day for signs of infection. These include redness, swelling, and pain that lasts or gets worse.  Do not pick your blisters.  Do not scratch your rash. General instructions  Rest as told by your doctor.  Keep all follow-up visits as told by your doctor. This is important.  Until your blisters scab over, your infection can cause chickenpox in people who have never had it or been vaccinated against it. To prevent this from happening, avoid touching other people or being around other people, especially:  Babies.  Pregnant women.  Children who have eczema.  Elderly people who have transplants.  People who have chronic illnesses, such as leukemia or AIDS. Contact a doctor if:  Your  pain does not get better with medicine.  Your pain does not get better after the rash heals.  Your rash looks infected. Signs of infection include:  Redness.  Swelling.  Pain that lasts or gets worse. Get help right away if:  The rash is on your face or nose.  You have pain in your face, pain around your eye area, or loss of feeling on one side of your face.  You have ear pain or you have ringing in your ear.  You have loss of taste.  Your condition gets worse. This information is not intended to replace advice given to you by your health care provider. Make sure you discuss any questions you have with your health care provider. Document Released: 08/09/2007 Document Revised: 10/17/2015 Document Reviewed: 12/02/2013 Elsevier Interactive Patient Education  2017 Elsevier Inc.  

## 2016-08-08 ENCOUNTER — Ambulatory Visit (INDEPENDENT_AMBULATORY_CARE_PROVIDER_SITE_OTHER): Payer: Medicare Other | Admitting: Pharmacist

## 2016-08-08 ENCOUNTER — Encounter: Payer: Self-pay | Admitting: Pharmacist

## 2016-08-08 DIAGNOSIS — Z794 Long term (current) use of insulin: Secondary | ICD-10-CM

## 2016-08-08 DIAGNOSIS — E1165 Type 2 diabetes mellitus with hyperglycemia: Secondary | ICD-10-CM | POA: Insufficient documentation

## 2016-08-08 NOTE — Patient Instructions (Signed)
Diabetes and Standards of Medical Care   Diabetes is complicated. You may find that your diabetes team includes a dietitian, nurse, diabetes educator, eye doctor, and more. To help everyone know what is going on and to help you get the care you deserve, the following schedule of care was developed to help keep you on track. Below are the tests, exams, vaccines, medicines, education, and plans you will need.  Blood Glucose Goals Prior to meals = 80 - 130 Within 2 hours of the start of a meal = less than 180  HbA1c test (goal is less than 7.0% - your last value was 11.9%) This test shows how well you have controlled your glucose over the past 2 to 3 months. It is used to see if your diabetes management plan needs to be adjusted.   It is performed at least 2 times a year if you are meeting treatment goals.  It is performed 4 times a year if therapy has changed or if you are not meeting treatment goals.  Blood pressure test  This test is performed at every routine medical visit. The goal is less than 140/90 mmHg for most people, but 130/80 mmHg in some cases. Ask your health care provider about your goal.  Dental exam  Follow up with the dentist regularly.  Eye exam  If you are diagnosed with type 1 diabetes as a child, get an exam upon reaching the age of 10 years or older and have had diabetes for 3 to 5 years. Yearly eye exams are recommended after that initial eye exam.  If you are diagnosed with type 1 diabetes as an adult, get an exam within 5 years of diagnosis and then yearly.  If you are diagnosed with type 2 diabetes, get an exam as soon as possible after the diagnosis and then yearly.  Foot care exam  Visual foot exams are performed at every routine medical visit. The exams check for cuts, injuries, or other problems with the feet.  A comprehensive foot exam should be done yearly. This includes visual inspection as well as assessing foot pulses and testing for loss of  sensation.  Check your feet nightly for cuts, injuries, or other problems with your feet. Tell your health care provider if anything is not healing.  Kidney function test (urine microalbumin)  This test is performed once a year.  Type 1 diabetes: The first test is performed 5 years after diagnosis.  Type 2 diabetes: The first test is performed at the time of diagnosis.  A serum creatinine and estimated glomerular filtration rate (eGFR) test is done once a year to assess the level of chronic kidney disease (CKD), if present.  Lipid profile (cholesterol, HDL, LDL, triglycerides)  Performed every 5 years for most people.  The goal for LDL is less than 100 mg/dL. If you are at high risk, the goal is less than 70 mg/dL.  The goal for HDL is 40 mg/dL to 50 mg/dL for men and 50 mg/dL to 60 mg/dL for women. An HDL cholesterol of 60 mg/dL or higher gives some protection against heart disease.  The goal for triglycerides is less than 150 mg/dL.  Influenza vaccine, pneumococcal vaccine, and hepatitis B vaccine  The influenza vaccine is recommended yearly.  The pneumococcal vaccine is generally given once in a lifetime. However, there are some instances when another vaccination is recommended. Check with your health care provider.  The hepatitis B vaccine is also recommended for adults with diabetes.    Diabetes self-management education  Education is recommended at diagnosis and ongoing as needed.  Treatment plan  Your treatment plan is reviewed at every medical visit.  Document Released: 12/18/2008 Document Revised: 10/23/2012 Document Reviewed: 07/23/2012 [employer] Patient Information 2014 [employer].   

## 2016-08-08 NOTE — Progress Notes (Signed)
Patient ID: Emma Griffin, female   DOB: 03-24-1950, 66 y.o.   MRN: [id number]   Subjective:    Emma Griffin is a 66 y.o. female who is referred by her PCP for diabetes education and medication adjustment for type 2 DM.  Current symptoms/problems include hyperglycemia and have been unchanged. Symptoms have been present for 3 months. Patient has seen two different endocrinologist but she chose not to return for follow up.    Current diabetic medications include levemir 80 units bid and Humalon 40 units prior to each meal.    The patient has gestation diabetes during her only full term pregnancy.  Two years later developed type 2 DM. Patient states she has taken metformin in past - did not help with BG.  Has also tried avandia, actos and prandin - these were stopped due to poor efficacy as well / patient was told she has type 1 DM  Current monitoring regimen: home blood tests - checking if she "feels foolish" but not checking on a regular basis Home blood sugar records: mostly 200's to 300's  Any episodes of hypoglycemia? no  Known diabetic complications: retinopathy, peripheral neuropathy and cardiovascular disease Cardiovascular risk factors: advanced age (older than 74 for men, 30 for women), diabetes mellitus, dyslipidemia, hypertension, obesity (BMI >= 30 kg/m2) and sedentary lifestyle Eye exam current (within one year): yes Weight trend: stable Prior visit with CDE: yes - but has been several years Current diet: in general, an "unhealthy" diet.  Lots of dental problems so had to eat soft foods. Current exercise: none Medication Compliance?  Yes   Is She on ACE inhibitor or angiotensin II receptor blocker?  Yes  lisinopril (Prinivil)    The following portions of the patient's history were reviewed and updated as appropriate: allergies, current medications, past family history, past medical history, past social history, past surgical history and problem list.    Objective:    BP  140/68   Pulse 78   Ht 5\' 8"  (1.727 m)   Wt 284 lb (128.8 kg)   BMI 43.18 kg/m    A1c = 11.9 (03/30/2016)  Lab Review Glucose (mg/dL)  Date Value  03/30/2016 209 (H)  12/28/2015 344 (H)  09/27/2015 206 (H)   Creatinine, Ser (mg/dL)  Date Value  03/30/2016 0.84  12/28/2015 0.83  09/27/2015 0.83      Assessment:    Diabetes Mellitus type II, under inadequate control but little data from HBG testing to help adjust insulin thearpy.    Plan:    1.  Rx changes: none 2.  Education: Reviewed 'ABCs' of diabetes management (respective goals in parentheses):  A1C (<7), blood pressure (<130/80), and cholesterol (LDL <100). 3. Professional CGM monitor placed today in office to try to get better understanding of BG trends.  Patient educated about proper care of CGM and site.  CGM was placed on back of upper left arm.  Patient will wear at least 5 days and up to 14 days.  She is to keep BG, diet and insulin administration records. She can engage in regular activities with special care when showering, changing clothes and swimming (only up to 3 feet and for 30 minutes at a time)  4. CHO counting diet discussed.  Reviewed CHO amount in various foods and how to read nutrition labels.  Discussed recommended serving sizes.  5.  Recommend check BG 3-4 times a day prior to meals and at bedtime.  Looking into coverage of personal CGM 6.  Recommended increase  physical activity - goal is 150 minutes per week but recommended she start with 10 to 15 minutes and increase as able.  7. Follow up: 14 days

## 2016-08-09 LAB — C-PEPTIDE: C-Peptide: 1.3 ng/mL (ref 1.1–4.4)

## 2016-08-09 LAB — BASIC METABOLIC PANEL
BUN/Creatinine Ratio: 18 (ref 12–28)
BUN: 16 mg/dL (ref 8–27)
CO2: 23 mmol/L (ref 18–29)
Calcium: 9.2 mg/dL (ref 8.7–10.3)
Chloride: 97 mmol/L (ref 96–106)
Creatinine, Ser: 0.88 mg/dL (ref 0.57–1.00)
GFR calc Af Amer: 79 mL/min/{1.73_m2} (ref >59)
GFR calc non Af Amer: 69 mL/min/{1.73_m2} (ref >59)
Glucose: 321 mg/dL — ABNORMAL HIGH (ref 65–99)
Potassium: 4.8 mmol/L (ref 3.5–5.2)
Sodium: 137 mmol/L (ref 134–144)

## 2016-08-11 ENCOUNTER — Encounter (INDEPENDENT_AMBULATORY_CARE_PROVIDER_SITE_OTHER): Payer: Medicare Other | Admitting: Ophthalmology

## 2016-08-11 ENCOUNTER — Other Ambulatory Visit: Payer: Self-pay | Admitting: Pharmacist

## 2016-08-11 DIAGNOSIS — D3132 Benign neoplasm of left choroid: Secondary | ICD-10-CM

## 2016-08-11 DIAGNOSIS — H35033 Hypertensive retinopathy, bilateral: Secondary | ICD-10-CM

## 2016-08-11 DIAGNOSIS — H43813 Vitreous degeneration, bilateral: Secondary | ICD-10-CM | POA: Diagnosis not present

## 2016-08-11 DIAGNOSIS — I10 Essential (primary) hypertension: Secondary | ICD-10-CM

## 2016-08-11 DIAGNOSIS — E11311 Type 2 diabetes mellitus with unspecified diabetic retinopathy with macular edema: Secondary | ICD-10-CM

## 2016-08-11 DIAGNOSIS — E113513 Type 2 diabetes mellitus with proliferative diabetic retinopathy with macular edema, bilateral: Secondary | ICD-10-CM

## 2016-08-11 MED ORDER — SITAGLIPTIN PHOSPHATE 100 MG PO TABS: 100.0000 mg | ORAL_TABLET | Freq: Every day | ORAL | 0 refills | Status: DC

## 2016-08-22 ENCOUNTER — Ambulatory Visit (INDEPENDENT_AMBULATORY_CARE_PROVIDER_SITE_OTHER): Payer: Medicare Other | Admitting: Pharmacist

## 2016-08-22 ENCOUNTER — Encounter: Payer: Self-pay | Admitting: Pharmacist

## 2016-08-22 VITALS — BP 132/68 | HR 75 | Ht 68.0 in | Wt 289.0 lb

## 2016-08-22 DIAGNOSIS — Z794 Long term (current) use of insulin: Secondary | ICD-10-CM | POA: Diagnosis not present

## 2016-08-22 DIAGNOSIS — E1165 Type 2 diabetes mellitus with hyperglycemia: Secondary | ICD-10-CM

## 2016-08-22 DIAGNOSIS — I10 Essential (primary) hypertension: Secondary | ICD-10-CM

## 2016-08-22 MED ORDER — SITAGLIPTIN-METFORMIN HCL ER 50-1000 MG PO TB24
1.0000 | ORAL_TABLET | Freq: Every day | ORAL | 1 refills | Status: DC
Start: 2016-08-22 — End: 2016-11-03

## 2016-08-22 NOTE — Progress Notes (Signed)
Patient ID: Emma Griffin, female   DOB: 08-26-1950, 66 y.o.   MRN: [id number]   Subjective:    Emma Griffin is a 66 y.o. female who presents for an follow up evaluation of Type 2 diabetes mellitus treated with insulin. We will also download [employer] Professional CGM today.  Take has been wearng CGM for last 14 days.  Current symptoms/problems include hyperglycemia and visual disturbances and have been improving. Symptoms have been present for 6 months.  Current diabetic medications include: Humalog 40 units prior to each mea, Levemir 80 units BID.  Just started Januvia 100mg  qd 08/16/2016.   CGM Report:  [employer] Professional CGM was placed in our office  Average BG 280 Average prior to starting Januvia = 308 Average after starting Januvia = 254 Estimated A1c = 11.4% No episodes of hypoglycemia noted  Trends:  Patient kept great food diary and was able to demonstrate to her how much regular soda and milkshakes increase BG.  BG in general was above average all day. Began to trend down after started Januvia  Current monitoring regimen: home blood tests - 1 to 4 times daily  Known diabetic complications: retinopathy, peripheral neuropathy and cardiovascular disease Cardiovascular risk factors: advanced age (older than 78 for men, 31 for women), diabetes mellitus, dyslipidemia, hypertension, obesity (BMI >= 30 kg/m2) and sedentary lifestyle Eye exam current (within one year): yes Weight trend: increasing steadily Current diet: in general, an "unhealthy" diet but has improved a little over the last 2 weeks. Still drinking regualr soda on average 2 times per week Current exercise: none Medication Compliance?  Yes   Is She on ACE inhibitor or angiotensin II receptor blocker?  Yes  lisinopril (Prinivil)    The following portions of the patient's history were reviewed and updated as appropriate: allergies, current medications and problem list.    Objective:    BP 132/68    Pulse 75   Ht 5\' 8"  (1.727 m)   Wt 289 lb (131.1 kg)   BMI 43.94 kg/m   Lab Review Glucose (mg/dL)  Date Value  08/08/2016 321 (H)  03/30/2016 209 (H)  12/28/2015 344 (H)   Glucose, Bld (mg/dL)  Date Value  09/12/2011 242 (H)   CO2 (mmol/L)  Date Value  08/08/2016 23  03/30/2016 26  12/28/2015 27   BUN (mg/dL)  Date Value  08/08/2016 16  03/30/2016 16  12/28/2015 19   Creatinine, Ser (mg/dL)  Date Value  08/08/2016 0.88  03/30/2016 0.84  12/28/2015 0.83      Assessment:    Diabetes Mellitus type II, under inadequate control.   HTN - BP was at goal today   Plan:    1.  Rx changes: switch Januvia to Janumet XR 100/1000mg  qd   Continue current doses of Levemir and Humalog  No change BP meds at this time. 2.  Reviewed 'ABCs' of diabetes management (respective goals in parentheses):  A1C (<7), blood pressure (<130/80), and cholesterol (LDL <100). 3. Discussed CGM reports and discussed effect of food and medications on BG. 4. CHO counting diet discussed.  Spent 20 minutes discussing diet. Reviewed CHO amount in various foods and how to read nutrition labels.  Discussed recommended serving sizes. Patient is advised to avoid sugar containing beverages 5.  Recommend check BG 2-4 times a day 6.  Increase activity - start with 10 minutes and increase as able to at least 30 minutes per day 7. Follow up: 6 weeks

## 2016-09-07 ENCOUNTER — Other Ambulatory Visit: Payer: Self-pay | Admitting: Pharmacist

## 2016-09-10 ENCOUNTER — Other Ambulatory Visit: Payer: Self-pay | Admitting: Nurse Practitioner

## 2016-09-10 DIAGNOSIS — IMO0002 Reserved for concepts with insufficient information to code with codable children: Secondary | ICD-10-CM

## 2016-09-10 DIAGNOSIS — E1136 Type 2 diabetes mellitus with diabetic cataract: Secondary | ICD-10-CM

## 2016-09-10 DIAGNOSIS — E1165 Type 2 diabetes mellitus with hyperglycemia: Secondary | ICD-10-CM

## 2016-09-15 ENCOUNTER — Other Ambulatory Visit: Payer: Self-pay | Admitting: Nurse Practitioner

## 2016-09-15 ENCOUNTER — Other Ambulatory Visit: Payer: Self-pay | Admitting: *Deleted

## 2016-09-15 DIAGNOSIS — E1165 Type 2 diabetes mellitus with hyperglycemia: Secondary | ICD-10-CM

## 2016-09-15 DIAGNOSIS — IMO0002 Reserved for concepts with insufficient information to code with codable children: Secondary | ICD-10-CM

## 2016-09-15 DIAGNOSIS — E1136 Type 2 diabetes mellitus with diabetic cataract: Secondary | ICD-10-CM

## 2016-09-15 MED ORDER — PEN NEEDLES 30G X 8 MM MISC: 1.0000 | Freq: Every day | 3 refills | Status: DC

## 2016-09-15 MED ORDER — INSULIN LISPRO 100 UNIT/ML (KWIKPEN): PEN_INJECTOR | SUBCUTANEOUS | 1 refills | Status: DC

## 2016-09-15 NOTE — Telephone Encounter (Signed)
What is the name of the medication? Novafin has been discontinue and needs another kind called in.  Have you contacted your pharmacy to request a refill? YES  Which pharmacy would you like this sent to? [facility]   Patient notified that their request is being sent to the clinical staff for review and that they should receive a call once it is complete. If they do not receive a call within 24 hours they can check with their pharmacy or our office.

## 2016-09-15 NOTE — Telephone Encounter (Signed)
Chart says that she is on humalog- is it ok to stay on that?

## 2016-09-15 NOTE — Telephone Encounter (Signed)
RX for pen needles sent into [facility]

## 2016-09-22 ENCOUNTER — Encounter (INDEPENDENT_AMBULATORY_CARE_PROVIDER_SITE_OTHER): Payer: Medicare Other | Admitting: Ophthalmology

## 2016-09-22 DIAGNOSIS — H35033 Hypertensive retinopathy, bilateral: Secondary | ICD-10-CM | POA: Diagnosis not present

## 2016-09-22 DIAGNOSIS — H2513 Age-related nuclear cataract, bilateral: Secondary | ICD-10-CM

## 2016-09-22 DIAGNOSIS — D3132 Benign neoplasm of left choroid: Secondary | ICD-10-CM | POA: Diagnosis not present

## 2016-09-22 DIAGNOSIS — I10 Essential (primary) hypertension: Secondary | ICD-10-CM

## 2016-09-22 DIAGNOSIS — E11311 Type 2 diabetes mellitus with unspecified diabetic retinopathy with macular edema: Secondary | ICD-10-CM | POA: Diagnosis not present

## 2016-09-22 DIAGNOSIS — E113513 Type 2 diabetes mellitus with proliferative diabetic retinopathy with macular edema, bilateral: Secondary | ICD-10-CM

## 2016-09-22 DIAGNOSIS — H43813 Vitreous degeneration, bilateral: Secondary | ICD-10-CM

## 2016-09-25 ENCOUNTER — Other Ambulatory Visit: Payer: Self-pay | Admitting: Nurse Practitioner

## 2016-10-03 ENCOUNTER — Ambulatory Visit (INDEPENDENT_AMBULATORY_CARE_PROVIDER_SITE_OTHER): Payer: Medicare Other | Admitting: Pharmacist

## 2016-10-03 ENCOUNTER — Encounter: Payer: Self-pay | Admitting: Pharmacist

## 2016-10-03 VITALS — BP 132/72 | HR 78 | Ht 68.0 in | Wt 287.8 lb

## 2016-10-03 DIAGNOSIS — E782 Mixed hyperlipidemia: Secondary | ICD-10-CM

## 2016-10-03 DIAGNOSIS — I10 Essential (primary) hypertension: Secondary | ICD-10-CM

## 2016-10-03 DIAGNOSIS — Z794 Long term (current) use of insulin: Secondary | ICD-10-CM | POA: Diagnosis not present

## 2016-10-03 DIAGNOSIS — E1165 Type 2 diabetes mellitus with hyperglycemia: Secondary | ICD-10-CM

## 2016-10-03 LAB — BAYER DCA HB A1C WAIVED: HB A1C (BAYER DCA - WAIVED): 11.7 % — ABNORMAL HIGH (ref <7.0)

## 2016-10-03 MED ORDER — INSULIN DEGLUDEC 200 UNIT/ML ~~LOC~~ SOPN: 160.0000 [IU] | PEN_INJECTOR | Freq: Every day | SUBCUTANEOUS | 0 refills | Status: DC

## 2016-10-03 NOTE — Patient Instructions (Signed)
Try [place] Aas U-200 in place of Levemir for the next week - injection Tresiba 160 units once a day (in place of Levemir 80 units twice a day)   Hypoglycemia Hypoglycemia occurs when the level of sugar (glucose) in the blood is too low. Glucose is a type of sugar that provides the body's main source of energy. Certain hormones (insulin and glucagon) control the level of glucose in the blood. Insulin lowers blood glucose, and glucagon increases blood glucose. Hypoglycemia can result from having too much insulin in the bloodstream, or from not eating enough food that contains glucose. Hypoglycemia can happen in people who do or do not have diabetes. It can develop quickly, and it can be a medical emergency. What are the causes? Hypoglycemia occurs most often in people who have diabetes. If you have diabetes, hypoglycemia may be caused by:  Diabetes medicine.  Not eating enough, or not eating often enough.  Increased physical activity.  Drinking alcohol, especially when you have not eaten recently.  If you do not have diabetes, hypoglycemia may be caused by:  A tumor in the pancreas. The pancreas is the organ that makes insulin.  Not eating enough, or not eating for long periods at a time (fasting).  Severe infection or illness that affects the liver, heart, or kidneys.  Certain medicines.  You may also have reactive hypoglycemia. This condition causes hypoglycemia within 4 hours of eating a meal. This may occur after having stomach surgery. Sometimes, the cause of reactive hypoglycemia is not known. What increases the risk? Hypoglycemia is more likely to develop in:  People who have diabetes and take medicines to lower blood glucose.  People who abuse alcohol.  People who have a severe illness.  What are the signs or symptoms? Hypoglycemia may not cause any symptoms. If you have symptoms, they may include:  Hunger.  Anxiety.  Sweating and feeling  clammy.  Confusion.  Dizziness or feeling light-headed.  Sleepiness.  Nausea.  Increased heart rate.  Headache.  Blurry vision.  Seizure.  Nightmares.  Tingling or numbness around the mouth, lips, or tongue.  A change in speech.  Decreased ability to concentrate.  A change in coordination.  Restless sleep.  Tremors or shakes.  Fainting.  Irritability.  How is this diagnosed? Hypoglycemia is diagnosed with a blood test to measure your blood glucose level. This blood test is done while you are having symptoms. Your health care provider may also do a physical exam and review your medical history. If you do not have diabetes, other tests may be done to find the cause of your hypoglycemia. How is this treated? This condition can often be treated by immediately eating or drinking something that contains glucose, such as:  3-4 sugar tablets (glucose pills).  Glucose gel, 15-gram tube.  Fruit juice, 4 oz (120 mL).  Regular soda (not diet soda), 4 oz (120 mL).  Low-fat milk, 4 oz (120 mL).  Several pieces of hard candy.  Sugar or honey, 1 Tbsp.  Treating Hypoglycemia If You Have Diabetes  If you are alert and able to swallow safely, follow the 15:15 rule:  Take 15 grams of a rapid-acting carbohydrate. Rapid-acting options include: ? 1 tube of glucose gel. ? 3 glucose pills. ? 6-8 pieces of hard candy. ? 4 oz (120 mL) of fruit juice. ? 4 oz (120 ml) of regular (not diet) soda.  Check your blood glucose 15 minutes after you take the carbohydrate.  If the repeat blood glucose level  is still at or below 70 mg/dL (3.9 mmol/L), take 15 grams of a carbohydrate again.  If your blood glucose level does not increase above 70 mg/dL (3.9 mmol/L) after 3 tries, seek emergency medical care.  After your blood glucose level returns to normal, eat a meal or a snack within 1 hour.  Treating Severe Hypoglycemia Severe hypoglycemia is when your blood glucose level is at  or below 54 mg/dL (3 mmol/L). Severe hypoglycemia is an emergency. Do not wait to see if the symptoms will go away. Get medical help right away. Call your local emergency services (911 in the [place] Do not drive yourself to the hospital. If you have severe hypoglycemia and you cannot eat or drink, you may need an injection of glucagon. A family member or close friend should learn how to check your blood glucose and how to give you a glucagon injection. Ask your health care provider if you need to have an emergency glucagon injection kit available. Severe hypoglycemia may need to be treated in a hospital. The treatment may include getting glucose through an IV tube. You may also need treatment for the cause of your hypoglycemia. Follow these instructions at home: General instructions  Avoid any diets that cause you to not eat enough food. Talk with your health care provider before you start any new diet.  Take over-the-counter and prescription medicines only as told by your health care provider.  Limit alcohol intake to no more than 1 drink per day for nonpregnant women and 2 drinks per day for men. One drink equals 12 oz of beer, 5 oz of wine, or 1 oz of hard liquor.  Keep all follow-up visits as told by your health care provider. This is important. If You Have Diabetes:   Make sure you know the symptoms of hypoglycemia.  Always have a rapid-acting carbohydrate snack with you to treat low blood sugar.  Follow your diabetes management plan, as told by your health care provider. Make sure you: ? Take your medicines as directed. ? Follow your exercise plan. ? Follow your meal plan. Eat on time, and do not skip meals. ? Check your blood glucose as often as directed. Make sure to check your blood glucose before and after exercise. If you exercise longer or in a different way than usual, check your blood glucose more often. ? Follow your sick day plan whenever you cannot eat or drink normally.  Make this plan in advance with your health care provider.  Share your diabetes management plan with people in your workplace, school, and household.  Check your urine for ketones when you are ill and as told by your health care provider.  Carry a medical alert card or wear medical alert jewelry. If You Have Reactive Hypoglycemia or Low Blood Sugar From Other Causes:  Monitor your blood glucose as told by your health care provider.  Follow instructions from your health care provider about eating or drinking restrictions. Contact a health care provider if:  You have problems keeping your blood glucose in your target range.  You have frequent episodes of hypoglycemia. Get help right away if:  You continue to have hypoglycemia symptoms after eating or drinking something containing glucose.  Your blood glucose is at or below 54 mg/dL (3 mmol/L).  You have a seizure.  You faint. These symptoms may represent a serious problem that is an emergency. Do not wait to see if the symptoms will go away. Get medical help right away. Call  your local emergency services (911 in the [place] Do not drive yourself to the hospital. This information is not intended to replace advice given to you by your health care provider. Make sure you discuss any questions you have with your health care provider. Document Released: 02/20/2005 Document Revised: 08/04/2015 Document Reviewed: 03/26/2015 Elsevier Interactive Patient Education  [employer].

## 2016-10-03 NOTE — Progress Notes (Signed)
Patient ID: Emma Griffin, female   DOB: 26-Jul-1950, 66 y.o.   MRN: [id number]   Subjective:    Emma Griffin is a 66 y.o. female who was referred by her PCP, Emma Pretty, NP for uncontrolled type 2 DM.  At our last visit I discussed the CGM report with patient.  Dietary and medication changes were recommended.  Patient reports that BG is improving but still in 200's  Current diabetic medications include: Humalog 40 units prior to each meal, Levemir 80 units BID and Janumet XR 50/1000mg  1 tablet qd.  HBG readings - pt did not bring in glucometer - checking only 2-3 times per week.  Reports BG in the 250's (which is down from 300's)  Emma Griffin reports that she was contacted by [place] about CGM - [place].  They are working on getting CGM for her currently.  Known diabetic complications: retinopathy, peripheral neuropathy and cardiovascular disease Cardiovascular risk factors: advanced age (older than 60 for men, 34 for women), diabetes mellitus, dyslipidemia, hypertension, obesity (BMI >= 30 kg/m2) and sedentary lifestyle Eye exam current (within one year): yes Weight trend: Decreased by about 2 lbs. Current diet: Improving.  Patient is eating smaller portions since our last visit and limiting sugar intake.  Still drinking regualr soda on average 2 times per week though at last visit she was drinking soda daily. Current exercise: none Medication Compliance?  Yes   Is She on ACE inhibitor or angiotensin II receptor blocker?  Yes  lisinopril (Prinivil)    The following portions of the patient's history were reviewed and updated as appropriate: allergies, current medications, past family history, past medical history, past social history, past surgical history and problem list.    Objective:    BP 132/72   Pulse 78   Ht 5\' 8"  (1.727 m)   Wt 287 lb 12 oz (130.5 kg)   BMI 43.75 kg/m    A1c today was 11.7% A1c = 11.9% (03/30/2016)  Lipid Panel     Component Value Date/Time   CHOL 135 03/30/2016 1137   TRIG 344 (H) 03/30/2016 1137   TRIG 389 (H) 05/27/2014 1034   HDL 31 (L) 03/30/2016 1137   HDL 31 (L) 05/27/2014 1034   CHOLHDL 4.4 03/30/2016 1137   LDLCALC 35 03/30/2016 1137   LDLCALC Comment 06/26/2013 1704   BMP Latest Ref Rng & Units 08/08/2016 03/30/2016 12/28/2015  Glucose 65 - 99 mg/dL 321(H) 209(H) 344(H)  BUN 8 - 27 mg/dL 16 16 19   Creatinine 0.57 - 1.00 mg/dL 0.88 0.84 0.83  BUN/Creat Ratio 12 - 28 18 19 23   Sodium 134 - 144 mmol/L 137 142 137  Potassium 3.5 - 5.2 mmol/L 4.8 4.1 4.6  Chloride 96 - 106 mmol/L 97 98 98  CO2 18 - 29 mmol/L 23 26 27   Calcium 8.7 - 10.3 mg/dL 9.2 9.1 8.8        Assessment:    Diabetes Mellitus type II, under inadequate control.   HTN - BP was at goal today Hyperlipidemia - not fating today so cannot check lipids; compliant with statin therapy   Plan:    1.  Rx recommendations:   Continue Janumet XR 50/1000mg  qd   Patient given Tyler Aas u-200 pen to try in place of Levemir - inject 160 units daily - she is to call and will send in Rx if sees significant difference in BG.  Continue current doses of Humalog u-100 - 40 units prior to each meal  Continue current BP and lipid meds 2.  Reviewed BG and A1c goals 3. Reviewed CHO counting diet 4.  Recommend check BG 2-4 times a day 5.  RTC in September to see PCP for follow up

## 2016-10-05 ENCOUNTER — Telehealth: Payer: Self-pay | Admitting: Nurse Practitioner

## 2016-10-05 NOTE — Telephone Encounter (Signed)
Patient states that she gave first dose of Tresiba 160 units qd yesterday  BG was 305, 272, 424 This am she gave second dose of Tresiba  BG today was 343 at noon and 293 at 3:30pm  Explained to patient more time is needed to see how Tyler Aas will compare to Levemir.  Recommended she continue [place] (adjustments are usually made every 3 days) If she does not see BG continue to decrease into 200's then she can restart Levemir 80units bid

## 2016-10-09 ENCOUNTER — Telehealth: Payer: Self-pay | Admitting: Pharmacist

## 2016-10-09 MED ORDER — INSULIN PEN NEEDLE 32G X 4 MM MISC: 1 refills | Status: DC

## 2016-10-09 MED ORDER — INSULIN DEGLUDEC 200 UNIT/ML ~~LOC~~ SOPN: 160.0000 [IU] | PEN_INJECTOR | Freq: Every day | SUBCUTANEOUS | 1 refills | Status: DC

## 2016-10-09 NOTE — Telephone Encounter (Signed)
Patient states that BG has been good over the last 3 days.  She reports BG over the weekend was 218, 150, 196, 225, 223, 151, 108.  She states that she is feeling so much better.  She would like to continue [place].  Rx sent to pharmacy.

## 2016-10-09 NOTE — Telephone Encounter (Signed)
Tried to return call - no answer

## 2016-11-02 ENCOUNTER — Encounter (INDEPENDENT_AMBULATORY_CARE_PROVIDER_SITE_OTHER): Payer: Medicare Other | Admitting: Ophthalmology

## 2016-11-02 DIAGNOSIS — H2513 Age-related nuclear cataract, bilateral: Secondary | ICD-10-CM | POA: Diagnosis not present

## 2016-11-02 DIAGNOSIS — H43813 Vitreous degeneration, bilateral: Secondary | ICD-10-CM | POA: Diagnosis not present

## 2016-11-02 DIAGNOSIS — E11311 Type 2 diabetes mellitus with unspecified diabetic retinopathy with macular edema: Secondary | ICD-10-CM

## 2016-11-02 DIAGNOSIS — H35033 Hypertensive retinopathy, bilateral: Secondary | ICD-10-CM | POA: Diagnosis not present

## 2016-11-02 DIAGNOSIS — E113513 Type 2 diabetes mellitus with proliferative diabetic retinopathy with macular edema, bilateral: Secondary | ICD-10-CM

## 2016-11-02 DIAGNOSIS — D3132 Benign neoplasm of left choroid: Secondary | ICD-10-CM | POA: Diagnosis not present

## 2016-11-02 DIAGNOSIS — I10 Essential (primary) hypertension: Secondary | ICD-10-CM

## 2016-11-03 ENCOUNTER — Telehealth: Payer: Self-pay | Admitting: Nurse Practitioner

## 2016-11-03 MED ORDER — SITAGLIP PHOS-METFORMIN HCL ER 50-1000 MG PO TB24
1.0000 | ORAL_TABLET | Freq: Every day | ORAL | 0 refills | Status: DC
Start: 2016-11-03 — End: 2016-11-09

## 2016-11-03 NOTE — Telephone Encounter (Signed)
Pt aware refill sent to pharmacy 

## 2016-11-07 ENCOUNTER — Other Ambulatory Visit: Payer: Self-pay | Admitting: *Deleted

## 2016-11-09 ENCOUNTER — Other Ambulatory Visit: Payer: Self-pay | Admitting: *Deleted

## 2016-11-09 MED ORDER — SITAGLIP PHOS-METFORMIN HCL ER 50-1000 MG PO TB24: 1.0000 | ORAL_TABLET | Freq: Every day | ORAL | 0 refills | Status: DC

## 2016-11-20 ENCOUNTER — Encounter: Payer: Self-pay | Admitting: Nurse Practitioner

## 2016-11-20 ENCOUNTER — Ambulatory Visit (INDEPENDENT_AMBULATORY_CARE_PROVIDER_SITE_OTHER): Payer: Medicare Other | Admitting: Nurse Practitioner

## 2016-11-20 VITALS — BP 125/70 | HR 78 | Temp 98.6°F | Ht 68.0 in | Wt 284.0 lb

## 2016-11-20 DIAGNOSIS — E1165 Type 2 diabetes mellitus with hyperglycemia: Secondary | ICD-10-CM | POA: Diagnosis not present

## 2016-11-20 DIAGNOSIS — I10 Essential (primary) hypertension: Secondary | ICD-10-CM

## 2016-11-20 DIAGNOSIS — E782 Mixed hyperlipidemia: Secondary | ICD-10-CM

## 2016-11-20 DIAGNOSIS — Z794 Long term (current) use of insulin: Secondary | ICD-10-CM | POA: Diagnosis not present

## 2016-11-20 DIAGNOSIS — K219 Gastro-esophageal reflux disease without esophagitis: Secondary | ICD-10-CM

## 2016-11-20 DIAGNOSIS — E1136 Type 2 diabetes mellitus with diabetic cataract: Secondary | ICD-10-CM | POA: Diagnosis not present

## 2016-11-20 DIAGNOSIS — R609 Edema, unspecified: Secondary | ICD-10-CM | POA: Diagnosis not present

## 2016-11-20 DIAGNOSIS — IMO0002 Reserved for concepts with insufficient information to code with codable children: Secondary | ICD-10-CM

## 2016-11-20 LAB — BAYER DCA HB A1C WAIVED: HB A1C (BAYER DCA - WAIVED): 10.2 % — ABNORMAL HIGH (ref <7.0)

## 2016-11-20 MED ORDER — FUROSEMIDE 40 MG PO TABS: 40.0000 mg | ORAL_TABLET | Freq: Every day | ORAL | 1 refills | Status: DC

## 2016-11-20 MED ORDER — LISINOPRIL 20 MG PO TABS: 20.0000 mg | ORAL_TABLET | Freq: Two times a day (BID) | ORAL | 1 refills | Status: DC

## 2016-11-20 MED ORDER — SITAGLIP PHOS-METFORMIN HCL ER 50-1000 MG PO TB24: 1.0000 | ORAL_TABLET | Freq: Every day | ORAL | 5 refills | Status: DC

## 2016-11-20 MED ORDER — INSULIN LISPRO 100 UNIT/ML (KWIKPEN): PEN_INJECTOR | SUBCUTANEOUS | 1 refills | Status: DC

## 2016-11-20 MED ORDER — METOPROLOL SUCCINATE ER 100 MG PO TB24: 100.0000 mg | ORAL_TABLET | Freq: Every day | ORAL | 5 refills | Status: DC

## 2016-11-20 MED ORDER — INSULIN DEGLUDEC 200 UNIT/ML ~~LOC~~ SOPN: 160.0000 [IU] | PEN_INJECTOR | Freq: Every day | SUBCUTANEOUS | 1 refills | Status: DC

## 2016-11-20 NOTE — Progress Notes (Signed)
Subjective:    Patient ID: Emma Griffin, female    DOB: 09/29/1950, 66 y.o.   MRN: [id number]  HPI   Emma Griffin is here today for follow up of chronic medical problem.  Outpatient Encounter Prescriptions as of 11/20/2016  Medication Sig  . Ascorbic Acid (VITAMIN C) 1000 MG tablet Take 1,000 mg by mouth daily.  Marland Kitchen atorvastatin (LIPITOR) 80 MG tablet Take 1 tablet (80 mg total) by mouth daily at 6 PM.  . bevacizumab (AVASTIN) 1.25 mg/0.1 mL SOLN Apply 1.25 mg to eye See admin instructions. Opthalmic injection every 4-6 weeks  . Calcium Carbonate-Vitamin D ([facility] CALCIUM/VIT D SOFT CHEWS PO) Take 1 tablet by mouth daily.   . fluoruracil (CARAC) 0.5 % cream APPLY TOPICALLY DAILY.  . furosemide (LASIX) 40 MG tablet Take 1 tablet (40 mg total) by mouth daily.  Marland Kitchen gatifloxacin (ZYMAR) 0.3 % ophthalmic drops Place 1 drop into the right eye 4 (four) times daily. Used for 2 days after surgery  . glucose blood (ONE TOUCH ULTRA TEST) test strip Test 4x a day and prn  Dx. e11.36  . HUMALOG KWIKPEN 100 UNIT/ML KiwkPen INJECT 40 UNITS UNDER THE SKIN 3 TIMES A DAY  . ibuprofen (ADVIL,MOTRIN) 200 MG tablet Take 400-600 mg by mouth every 6 (six) hours as needed. For pain/cramps  . Insulin Degludec (TRESIBA FLEXTOUCH) 200 UNIT/ML SOPN Inject 160 Units into the skin daily.  . Insulin Pen Needle (NOVOFINE PLUS) 32G X 4 MM MISC Use with insulin pens to inject insulin 4 times per day  . Lancets (ONETOUCH ULTRASOFT) lancets TEST FOUR TIMES DAILY  . lisinopril (PRINIVIL,ZESTRIL) 20 MG tablet Take 1 tablet (20 mg total) by mouth 2 (two) times daily.  . metoprolol succinate (TOPROL-XL) 100 MG 24 hr tablet TAKE 1 TABLET (100 MG TOTAL) BY MOUTH DAILY.  . Multiple Vitamins-Minerals (AIRBORNE) CHEW Chew 1 tablet by mouth as needed.   . nitroGLYCERIN (NITROSTAT) 0.4 MG SL tablet Place under the tongue.  . prasugrel (EFFIENT) 10 MG TABS tablet Take 1 tablet (10 mg total) by mouth daily.  Marland Kitchen RA KRILL OIL 500 MG CAPS Take 1  capsule by mouth daily.   . SitaGLIPtin-MetFORMIN HCl (JANUMET XR) 50-1000 MG TB24 Take 1 tablet by mouth daily. Take with food   No facility-administered encounter medications on file as of 11/20/2016.     1. Type 2 diabetes mellitus with hyperglycemia, with long-term current use of insulin (HCC) Last hgba1c was 11.%. She was changed form levemir to tresiba 160u daily. Blood sugars are still running high but unable to give me a number. Is not good at watching diet.  2. Essential hypertension  No c/o chest pain, SOB or headache. Does not check blood pressures at home  3. Mixed hyperlipidemia  Does not wathc diet  4. Gastroesophageal reflux disease without esophagitis  Only takes OTC meds when needed  5. Severe obesity (BMI >= 40) (HCC)  No recent weight changes  6. Peripheral edema  Takes lasix and develops swelling if she is up on her feet alot  7. 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 Son helps them a lot.   Review of Systems  Constitutional: Negative for activity change and appetite change.  HENT: Negative.   Eyes: Negative for pain.  Respiratory: Negative for  shortness of breath.   Cardiovascular: Negative for chest pain, palpitations and leg swelling.  Gastrointestinal: Negative for abdominal pain.  Endocrine: Negative for polydipsia.  Genitourinary: Negative.   Skin: Negative for rash.  Neurological: Negative for dizziness, weakness and headaches.  Hematological: Does not bruise/bleed easily.  Psychiatric/Behavioral: Negative.   All other systems reviewed and are negative.      Objective:   Physical Exam  Constitutional: She is oriented to person, place, and time. She appears well-developed and well-nourished.  HENT:  Nose: Nose normal.  Mouth/Throat: Oropharynx is  clear and moist.  Eyes: EOM are normal.  Neck: Trachea normal, normal range of motion and full passive range of motion without pain. Neck supple. No JVD present. Carotid bruit is not present. No thyromegaly present.  Cardiovascular: Normal rate, regular rhythm, normal heart sounds and intact distal pulses.  Exam reveals no gallop and no friction rub.   No murmur heard. Pulmonary/Chest: Effort normal and breath sounds normal.  Abdominal: Soft. Bowel sounds are normal. She exhibits no distension and no mass. There is no tenderness.  Musculoskeletal: Normal range of motion. She exhibits edema (1+ edema bil lower ext).  Lymphadenopathy:    She has no cervical adenopathy.  Neurological: She is alert and oriented to person, place, and time. She has normal reflexes.  Skin: Skin is warm and dry.  Psychiatric: She has a normal mood and affect. Her behavior is normal. Judgment and thought content normal.   BP 125/70   Pulse 78   Temp 98.6 F (37 C) (Oral)   Ht 5' 8" (1.727 m)   Wt 284 lb (128.8 kg)   BMI 43.18 kg/m   HGBA1c 10.2%    Assessment & Plan:  1. Type 2 diabetes mellitus with hyperglycemia, with long-term current use of insulin (HCC) Continue to wtch carbs Not going to make anymore changes at this time- has only been on tresiba for 1  Month- - Bayer DCA Hb A1c Waived - Insulin Degludec (TRESIBA FLEXTOUCH) 200 UNIT/ML SOPN; Inject 160 Units into the skin daily.  Dispense: 27 mL; Refill: 1 - SitaGLIPtin-MetFORMIN HCl (JANUMET XR) 50-1000 MG TB24; Take 1 tablet by mouth daily. Take with food  Dispense: 30 tablet; Refill: 5 - insulin lispro (HUMALOG KWIKPEN) 100 UNIT/ML KiwkPen; INJECT 40 UNITS UNDER THE SKIN 3 TIMES A DAY  Dispense: 120 mL; Refill: 1   2. Essential hypertension Low sodium diet - CMP14+EGFR - lisinopril (PRINIVIL,ZESTRIL) 20 MG tablet; Take 1 tablet (20 mg total) by mouth 2 (two) times daily.  Dispense: 180 tablet; Refill: 1 - metoprolol succinate (TOPROL-XL) 100 MG  24 hr tablet; Take 1 tablet (100 mg total) by mouth daily.  Dispense: 30 tablet; Refill: 5  3. Mixed hyperlipidemia Low fat diet - Lipid panel  4. Gastroesophageal reflux disease without esophagitis Avoid spicy foods Do not eat 2 hours prior to bedtime  5. Severe obesity (BMI >= 40) (HCC) Discussed diet and exercise for person with BMI >25 Will recheck weight in 3-6 months  6. Peripheral edema Elevate legs when sitting - furosemide (LASIX) 40 MG tablet; Take 1 tablet (40 mg total) by mouth daily.  Dispense: 90 tablet; Refill: 1  7. 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

## 2016-11-20 NOTE — Patient Instructions (Signed)

## 2016-11-21 LAB — LIPID PANEL
Chol/HDL Ratio: 3.9 ratio (ref 0.0–4.4)
Cholesterol, Total: 125 mg/dL (ref 100–199)
HDL: 32 mg/dL — ABNORMAL LOW (ref >39)
LDL Calculated: 51 mg/dL (ref 0–99)
Triglycerides: 210 mg/dL — ABNORMAL HIGH (ref 0–149)
VLDL Cholesterol Cal: 42 mg/dL — ABNORMAL HIGH (ref 5–40)

## 2016-11-21 LAB — CMP14+EGFR
ALT: 27 IU/L (ref 0–32)
AST: 28 IU/L (ref 0–40)
Albumin/Globulin Ratio: 1.1 — ABNORMAL LOW (ref 1.2–2.2)
Albumin: 3.8 g/dL (ref 3.6–4.8)
Alkaline Phosphatase: 115 IU/L (ref 39–117)
BUN/Creatinine Ratio: 16 (ref 12–28)
BUN: 16 mg/dL (ref 8–27)
Bilirubin Total: 0.5 mg/dL (ref 0.0–1.2)
CO2: 27 mmol/L (ref 20–29)
Calcium: 9.1 mg/dL (ref 8.7–10.3)
Chloride: 102 mmol/L (ref 96–106)
Creatinine, Ser: 1 mg/dL (ref 0.57–1.00)
GFR calc Af Amer: 68 mL/min/{1.73_m2} (ref >59)
GFR calc non Af Amer: 59 mL/min/{1.73_m2} — ABNORMAL LOW (ref >59)
Globulin, Total: 3.6 g/dL (ref 1.5–4.5)
Glucose: 198 mg/dL — ABNORMAL HIGH (ref 65–99)
Potassium: 4.3 mmol/L (ref 3.5–5.2)
Sodium: 144 mmol/L (ref 134–144)
Total Protein: 7.4 g/dL (ref 6.0–8.5)

## 2016-12-07 ENCOUNTER — Encounter (INDEPENDENT_AMBULATORY_CARE_PROVIDER_SITE_OTHER): Payer: Medicare Other | Admitting: Ophthalmology

## 2016-12-07 DIAGNOSIS — H43813 Vitreous degeneration, bilateral: Secondary | ICD-10-CM | POA: Diagnosis not present

## 2016-12-07 DIAGNOSIS — E113513 Type 2 diabetes mellitus with proliferative diabetic retinopathy with macular edema, bilateral: Secondary | ICD-10-CM | POA: Diagnosis not present

## 2016-12-07 DIAGNOSIS — I10 Essential (primary) hypertension: Secondary | ICD-10-CM | POA: Diagnosis not present

## 2016-12-07 DIAGNOSIS — E11311 Type 2 diabetes mellitus with unspecified diabetic retinopathy with macular edema: Secondary | ICD-10-CM | POA: Diagnosis not present

## 2016-12-07 DIAGNOSIS — H35033 Hypertensive retinopathy, bilateral: Secondary | ICD-10-CM

## 2016-12-07 DIAGNOSIS — D3132 Benign neoplasm of left choroid: Secondary | ICD-10-CM

## 2016-12-11 ENCOUNTER — Telehealth: Payer: Self-pay | Admitting: Nurse Practitioner

## 2016-12-11 NOTE — Telephone Encounter (Signed)
Note written for patient and faxed to [employer] [facility]

## 2017-01-05 ENCOUNTER — Encounter (INDEPENDENT_AMBULATORY_CARE_PROVIDER_SITE_OTHER): Payer: Medicare Other | Admitting: Ophthalmology

## 2017-01-05 DIAGNOSIS — H43813 Vitreous degeneration, bilateral: Secondary | ICD-10-CM | POA: Diagnosis not present

## 2017-01-05 DIAGNOSIS — E11311 Type 2 diabetes mellitus with unspecified diabetic retinopathy with macular edema: Secondary | ICD-10-CM

## 2017-01-05 DIAGNOSIS — H35033 Hypertensive retinopathy, bilateral: Secondary | ICD-10-CM | POA: Diagnosis not present

## 2017-01-05 DIAGNOSIS — E113513 Type 2 diabetes mellitus with proliferative diabetic retinopathy with macular edema, bilateral: Secondary | ICD-10-CM | POA: Diagnosis not present

## 2017-01-05 DIAGNOSIS — I10 Essential (primary) hypertension: Secondary | ICD-10-CM | POA: Diagnosis not present

## 2017-01-05 DIAGNOSIS — H2513 Age-related nuclear cataract, bilateral: Secondary | ICD-10-CM

## 2017-01-05 DIAGNOSIS — D3132 Benign neoplasm of left choroid: Secondary | ICD-10-CM

## 2017-02-02 ENCOUNTER — Encounter (INDEPENDENT_AMBULATORY_CARE_PROVIDER_SITE_OTHER): Payer: Medicare Other | Admitting: Ophthalmology

## 2017-02-02 DIAGNOSIS — E11311 Type 2 diabetes mellitus with unspecified diabetic retinopathy with macular edema: Secondary | ICD-10-CM

## 2017-02-02 DIAGNOSIS — H43813 Vitreous degeneration, bilateral: Secondary | ICD-10-CM | POA: Diagnosis not present

## 2017-02-02 DIAGNOSIS — I10 Essential (primary) hypertension: Secondary | ICD-10-CM | POA: Diagnosis not present

## 2017-02-02 DIAGNOSIS — E113513 Type 2 diabetes mellitus with proliferative diabetic retinopathy with macular edema, bilateral: Secondary | ICD-10-CM | POA: Diagnosis not present

## 2017-02-02 DIAGNOSIS — H35033 Hypertensive retinopathy, bilateral: Secondary | ICD-10-CM

## 2017-02-02 DIAGNOSIS — D3132 Benign neoplasm of left choroid: Secondary | ICD-10-CM

## 2017-02-02 DIAGNOSIS — H2513 Age-related nuclear cataract, bilateral: Secondary | ICD-10-CM | POA: Diagnosis not present

## 2017-02-09 ENCOUNTER — Ambulatory Visit (INDEPENDENT_AMBULATORY_CARE_PROVIDER_SITE_OTHER): Payer: Medicare Other

## 2017-02-09 DIAGNOSIS — Z23 Encounter for immunization: Secondary | ICD-10-CM | POA: Diagnosis not present

## 2017-02-20 ENCOUNTER — Encounter: Payer: Self-pay | Admitting: Nurse Practitioner

## 2017-02-20 ENCOUNTER — Ambulatory Visit: Payer: Medicare Other | Admitting: Nurse Practitioner

## 2017-02-20 VITALS — BP 129/76 | HR 77 | Temp 98.8°F | Ht 68.0 in | Wt 282.0 lb

## 2017-02-20 DIAGNOSIS — E1165 Type 2 diabetes mellitus with hyperglycemia: Secondary | ICD-10-CM | POA: Diagnosis not present

## 2017-02-20 DIAGNOSIS — Z794 Long term (current) use of insulin: Secondary | ICD-10-CM

## 2017-02-20 DIAGNOSIS — R609 Edema, unspecified: Secondary | ICD-10-CM | POA: Diagnosis not present

## 2017-02-20 DIAGNOSIS — K219 Gastro-esophageal reflux disease without esophagitis: Secondary | ICD-10-CM | POA: Diagnosis not present

## 2017-02-20 DIAGNOSIS — E785 Hyperlipidemia, unspecified: Secondary | ICD-10-CM

## 2017-02-20 DIAGNOSIS — I10 Essential (primary) hypertension: Secondary | ICD-10-CM

## 2017-02-20 LAB — BAYER DCA HB A1C WAIVED: HB A1C (BAYER DCA - WAIVED): 9.6 % — ABNORMAL HIGH (ref <7.0)

## 2017-02-20 MED ORDER — METOPROLOL SUCCINATE ER 100 MG PO TB24: 100.0000 mg | ORAL_TABLET | Freq: Every day | ORAL | 5 refills | Status: DC

## 2017-02-20 MED ORDER — FUROSEMIDE 40 MG PO TABS: 40.0000 mg | ORAL_TABLET | Freq: Every day | ORAL | 1 refills | Status: DC

## 2017-02-20 MED ORDER — SITAGLIP PHOS-METFORMIN HCL ER 50-1000 MG PO TB24: 1.0000 | ORAL_TABLET | Freq: Every day | ORAL | 5 refills | Status: DC

## 2017-02-20 MED ORDER — ATORVASTATIN CALCIUM 80 MG PO TABS: 80.0000 mg | ORAL_TABLET | Freq: Every day | ORAL | 11 refills | Status: DC

## 2017-02-20 MED ORDER — INSULIN DEGLUDEC 200 UNIT/ML ~~LOC~~ SOPN: 160.0000 [IU] | PEN_INJECTOR | Freq: Every day | SUBCUTANEOUS | 1 refills | Status: DC

## 2017-02-20 MED ORDER — INSULIN PEN NEEDLE 32G X 4 MM MISC: 1 refills | Status: DC

## 2017-02-20 MED ORDER — INSULIN LISPRO 100 UNIT/ML (KWIKPEN): PEN_INJECTOR | SUBCUTANEOUS | 1 refills | Status: DC

## 2017-02-20 MED ORDER — LISINOPRIL 20 MG PO TABS: 20.0000 mg | ORAL_TABLET | Freq: Two times a day (BID) | ORAL | 1 refills | Status: DC

## 2017-02-20 NOTE — Patient Instructions (Signed)

## 2017-02-20 NOTE — Progress Notes (Signed)
Subjective:    Patient ID: Emma Griffin, female    DOB: January 29, 1951, 66 y.o.   MRN: [id number]  HPI  Anahis Furgeson is here today for follow up of chronic medical problem.  Outpatient Encounter Medications as of 02/20/2017  Medication Sig  . Ascorbic Acid (VITAMIN C) 1000 MG tablet Take 1,000 mg by mouth daily.  Marland Kitchen aspirin EC 81 MG tablet Take 81 mg by mouth daily.  Marland Kitchen atorvastatin (LIPITOR) 80 MG tablet Take 1 tablet (80 mg total) by mouth daily at 6 PM.  . bevacizumab (AVASTIN) 1.25 mg/0.1 mL SOLN Apply 1.25 mg to eye See admin instructions. Opthalmic injection every 4-6 weeks  . Calcium Carbonate-Vitamin D ([facility] CALCIUM/VIT D SOFT CHEWS PO) Take 1 tablet by mouth daily.   . fluoruracil (CARAC) 0.5 % cream APPLY TOPICALLY DAILY.  . furosemide (LASIX) 40 MG tablet Take 1 tablet (40 mg total) by mouth daily.  Marland Kitchen gatifloxacin (ZYMAR) 0.3 % ophthalmic drops Place 1 drop into the right eye 4 (four) times daily. Used for 2 days after surgery  . glucose blood (ONE TOUCH ULTRA TEST) test strip Test 4x a day and prn  Dx. e11.36  . ibuprofen (ADVIL,MOTRIN) 200 MG tablet Take 400-600 mg by mouth every 6 (six) hours as needed. For pain/cramps  . Insulin Degludec (TRESIBA FLEXTOUCH) 200 UNIT/ML SOPN Inject 160 Units into the skin daily.  . insulin lispro (HUMALOG KWIKPEN) 100 UNIT/ML KiwkPen INJECT 40 UNITS UNDER THE SKIN 3 TIMES A DAY  . Insulin Pen Needle (NOVOFINE PLUS) 32G X 4 MM MISC Use with insulin pens to inject insulin 4 times per day  . Lancets (ONETOUCH ULTRASOFT) lancets TEST FOUR TIMES DAILY  . lisinopril (PRINIVIL,ZESTRIL) 20 MG tablet Take 1 tablet (20 mg total) by mouth 2 (two) times daily.  . metoprolol succinate (TOPROL-XL) 100 MG 24 hr tablet Take 1 tablet (100 mg total) by mouth daily.  . Multiple Vitamins-Minerals (AIRBORNE) CHEW Chew 1 tablet by mouth as needed.   . nitroGLYCERIN (NITROSTAT) 0.4 MG SL tablet Place under the tongue.  Marland Kitchen RA KRILL OIL 500 MG CAPS Take 1 capsule by  mouth daily.   . SitaGLIPtin-MetFORMIN HCl (JANUMET XR) 50-1000 MG TB24 Take 1 tablet by mouth daily. Take with food     1. Essential hypertension  No c/o chest pain, sob or headache. Does not check blood pressure at home. BP Readings from Last 3 Encounters:  11/20/16 125/70  10/03/16 132/72  08/22/16 132/68     2. Gastroesophageal reflux disease without esophagitis  Only takes OTC meds as needed  3. Type 2 diabetes mellitus with hyperglycemia, with long-term current use of insulin (HCC)  Last hgba1c was 10.2 which was down from 11.7. Patient is very noncompliant with diet and does not check blood sugars often. No changes were made at last visit because she had only been on tresiba for 1 month.  4. Hyperlipidemia, unspecified hyperlipidemia type  Does not watch diet at all  5. Severe obesity (BMI >= 40) (HCC)  No recent weight changes  6. Peripheral edema  Always has edema- gets some better with elevation.    New complaints: None today  Social history: Lives with husband- neither of them work and they are both very unhealthy    Review of Systems  Constitutional: Negative for activity change and appetite change.  HENT: Negative.   Eyes: Negative for pain.  Respiratory: Negative for shortness of breath.   Cardiovascular: Negative for chest pain, palpitations and leg  swelling.  Gastrointestinal: Negative for abdominal pain.  Endocrine: Negative for polydipsia.  Genitourinary: Negative.   Skin: Negative for rash.  Neurological: Negative for dizziness, weakness and headaches.  Hematological: Does not bruise/bleed easily.  Psychiatric/Behavioral: Negative.   All other systems reviewed and are negative.      Objective:   Physical Exam  Constitutional: She is oriented to person, place, and time. She appears well-developed and well-nourished.  HENT:  Nose: Nose normal.  Mouth/Throat: Oropharynx is clear and moist.  Eyes: EOM are normal.  Neck: Trachea normal, normal  range of motion and full passive range of motion without pain. Neck supple. No JVD present. Carotid bruit is not present. No thyromegaly present.  Cardiovascular: Normal rate, regular rhythm, normal heart sounds and intact distal pulses. Exam reveals no gallop and no friction rub.  No murmur heard. Pulmonary/Chest: Effort normal and breath sounds normal.  Abdominal: Soft. Bowel sounds are normal. She exhibits no distension and no mass. There is no tenderness.  Musculoskeletal: Normal range of motion.  Lymphadenopathy:    She has no cervical adenopathy.  Neurological: She is alert and oriented to person, place, and time. She has normal reflexes.  Skin: Skin is warm and dry.  Psychiatric: She has a normal mood and affect. Her behavior is normal. Judgment and thought content normal.   BP 129/76   Pulse 77   Temp 98.8 F (37.1 C) (Oral)   Ht '5\' 8"'  (1.727 m)   Wt 282 lb (127.9 kg)   BMI 42.88 kg/m   hgba1c 9.6%     Assessment & Plan:  1. Essential hypertension Low sodium diet - CMP14+EGFR  2. Gastroesophageal reflux disease without esophagitis Avoid spicy foods Do not eat 2 hours prior to bedtime  3. Type 2 diabetes mellitus with hyperglycemia, with long-term current use of insulin (HCC) Continue all meds Continue to watch carbs in diet No changes since hgba1c is trending downward - Bayer DCA Hb A1c Waived  4. Hyperlipidemia, unspecified hyperlipidemia type Low fat diet - Lipid panel  5. Severe obesity (BMI >= 40) (HCC) Discussed diet and exercise for person with BMI >25 Will recheck weight in 3-6 months  6. Peripheral edema Elevate legs when sitting    Labs pending Health maintenance reviewed Diet and exercise encouraged Continue all meds Follow up  In 3 months   [place], [name]

## 2017-02-21 LAB — LIPID PANEL
Chol/HDL Ratio: 4.3 ratio (ref 0.0–4.4)
Cholesterol, Total: 124 mg/dL (ref 100–199)
HDL: 29 mg/dL — ABNORMAL LOW (ref >39)
LDL Calculated: 50 mg/dL (ref 0–99)
Triglycerides: 223 mg/dL — ABNORMAL HIGH (ref 0–149)
VLDL Cholesterol Cal: 45 mg/dL — ABNORMAL HIGH (ref 5–40)

## 2017-02-21 LAB — CMP14+EGFR
ALT: 26 IU/L (ref 0–32)
AST: 22 IU/L (ref 0–40)
Albumin/Globulin Ratio: 1 — ABNORMAL LOW (ref 1.2–2.2)
Albumin: 3.7 g/dL (ref 3.6–4.8)
Alkaline Phosphatase: 105 IU/L (ref 39–117)
BUN/Creatinine Ratio: 21 (ref 12–28)
BUN: 20 mg/dL (ref 8–27)
Bilirubin Total: 0.5 mg/dL (ref 0.0–1.2)
CO2: 26 mmol/L (ref 20–29)
Calcium: 9 mg/dL (ref 8.7–10.3)
Chloride: 103 mmol/L (ref 96–106)
Creatinine, Ser: 0.97 mg/dL (ref 0.57–1.00)
GFR calc Af Amer: 70 mL/min/{1.73_m2} (ref >59)
GFR calc non Af Amer: 61 mL/min/{1.73_m2} (ref >59)
Globulin, Total: 3.7 g/dL (ref 1.5–4.5)
Glucose: 65 mg/dL (ref 65–99)
Potassium: 4.3 mmol/L (ref 3.5–5.2)
Sodium: 143 mmol/L (ref 134–144)
Total Protein: 7.4 g/dL (ref 6.0–8.5)

## 2017-02-23 ENCOUNTER — Other Ambulatory Visit: Payer: Self-pay

## 2017-02-23 DIAGNOSIS — E1165 Type 2 diabetes mellitus with hyperglycemia: Secondary | ICD-10-CM

## 2017-02-23 DIAGNOSIS — Z794 Long term (current) use of insulin: Principal | ICD-10-CM

## 2017-02-23 MED ORDER — INSULIN DEGLUDEC 200 UNIT/ML ~~LOC~~ SOPN: 160.0000 [IU] | PEN_INJECTOR | Freq: Every day | SUBCUTANEOUS | 1 refills | Status: DC

## 2017-03-05 ENCOUNTER — Other Ambulatory Visit: Payer: Self-pay | Admitting: Physician Assistant

## 2017-03-05 MED ORDER — ALPRAZOLAM 0.25 MG PO TABS: 0.2500 mg | ORAL_TABLET | Freq: Two times a day (BID) | ORAL | 0 refills | Status: DC | PRN

## 2017-03-07 ENCOUNTER — Telehealth: Payer: Self-pay | Admitting: Nurse Practitioner

## 2017-03-07 NOTE — Telephone Encounter (Signed)
husband passed suddenly for cardiac arrest last week- patient having a very difficult time. Wold like some xanax to calm her until can get through funeral. Emma Griffin called some xanax in on 03/05/17

## 2017-03-09 ENCOUNTER — Encounter (INDEPENDENT_AMBULATORY_CARE_PROVIDER_SITE_OTHER): Payer: Medicare Other | Admitting: Ophthalmology

## 2017-03-13 ENCOUNTER — Ambulatory Visit: Payer: Medicare Other | Admitting: Family Medicine

## 2017-03-13 ENCOUNTER — Encounter: Payer: Self-pay | Admitting: Family Medicine

## 2017-03-13 VITALS — BP 112/58 | HR 67 | Temp 98.1°F | Ht 68.0 in | Wt 280.0 lb

## 2017-03-13 DIAGNOSIS — R05 Cough: Secondary | ICD-10-CM | POA: Diagnosis not present

## 2017-03-13 DIAGNOSIS — J329 Chronic sinusitis, unspecified: Secondary | ICD-10-CM

## 2017-03-13 DIAGNOSIS — R059 Cough, unspecified: Secondary | ICD-10-CM

## 2017-03-13 DIAGNOSIS — J40 Bronchitis, not specified as acute or chronic: Secondary | ICD-10-CM | POA: Diagnosis not present

## 2017-03-13 MED ORDER — AMOXICILLIN-POT CLAVULANATE 875-125 MG PO TABS: 1.0000 | ORAL_TABLET | Freq: Two times a day (BID) | ORAL | 0 refills | Status: DC

## 2017-03-13 NOTE — Progress Notes (Signed)
Chief Complaint  Patient presents with  . Cough    pt here today c/o cough/congestion and has 68 y.o mother in ICU who was dx'd with RSV  and pt would like to be tested for RSV    HPI  Patient presents today for Patient presents with upper respiratory congestion.  Her mother is in intensive care with RSV diagnosis.  Patient wants to be sure that she is not the one that gave it to her since she currently has symptoms as follows.  Rhinorrhea that is frequently purulent. There is moderate sore throat. Patient reports productive cough with wheezing frequently as well.  Purulent, sputum noted. There is no fever, chills or sweats. The patient denies being short of breath. Onset was 3 days ago. Gradually worsening. Tried OTCs without improvement.  PMH: Smoking status noted ROS: Per HPI  Objective: BP (!) 112/58   Pulse 67   Temp 98.1 F (36.7 C) (Oral)   Ht 5\' 8"  (1.727 m)   Wt 280 lb (127 kg)   BMI 42.57 kg/m  Gen: NAD, alert, cooperative with exam HEENT: NCAT, Nasal passages swollen, red TMS clear  CV: RRR, good S1/S2, no murmur Resp: Bronchitis changes with scattered wheezes, non-labored Ext: No edema, warm Neuro: Alert and oriented, No gross deficits  Assessment and plan:  1. Sinobronchitis   2. Cough     Meds ordered this encounter  Medications  . amoxicillin-clavulanate (AUGMENTIN) 875-125 MG tablet    Sig: Take 1 tablet by mouth 2 (two) times daily.    Dispense:  20 tablet    Refill:  0    Orders Placed This Encounter  Procedures  . RSV Ag, EIA    Follow up as needed.  Claretta Fraise, MD

## 2017-03-14 ENCOUNTER — Telehealth: Payer: Self-pay | Admitting: *Deleted

## 2017-03-14 LAB — RSV AG, EIA: RSV Ag, EIA: NEGATIVE

## 2017-03-14 NOTE — Telephone Encounter (Signed)
appt made for pt tomorrow am

## 2017-03-15 ENCOUNTER — Encounter (INDEPENDENT_AMBULATORY_CARE_PROVIDER_SITE_OTHER): Payer: Medicare Other | Admitting: Ophthalmology

## 2017-03-15 ENCOUNTER — Encounter: Payer: Self-pay | Admitting: Family

## 2017-03-15 ENCOUNTER — Ambulatory Visit: Payer: Medicare Other | Admitting: Nurse Practitioner

## 2017-03-15 ENCOUNTER — Ambulatory Visit: Payer: Medicare Other | Admitting: Family

## 2017-03-15 ENCOUNTER — Telehealth: Payer: Self-pay | Admitting: *Deleted

## 2017-03-15 VITALS — BP 134/71 | HR 71 | Temp 99.4°F | Ht 68.0 in | Wt 280.2 lb

## 2017-03-15 DIAGNOSIS — R6889 Other general symptoms and signs: Secondary | ICD-10-CM

## 2017-03-15 LAB — VERITOR FLU A/B WAIVED
Influenza A: NEGATIVE
Influenza B: NEGATIVE

## 2017-03-15 LAB — RAPID STREP SCREEN (MED CTR MEBANE ONLY): Strep Gp A Ag, IA W/Reflex: NEGATIVE

## 2017-03-15 LAB — CULTURE, GROUP A STREP

## 2017-03-15 MED ORDER — HYDROCODONE-HOMATROPINE 5-1.5 MG/5ML PO SYRP: 5.0000 mL | ORAL_SOLUTION | Freq: Three times a day (TID) | ORAL | 0 refills | Status: DC | PRN

## 2017-03-15 NOTE — Telephone Encounter (Signed)
Patient's flu test was negative.

## 2017-03-15 NOTE — Addendum Note (Signed)
Addended by: Evelina Dun A on: 03/15/2017 09:42 AM   Modules accepted: Orders

## 2017-03-15 NOTE — Patient Instructions (Signed)

## 2017-03-15 NOTE — Progress Notes (Addendum)
   Subjective:    Patient ID: Emma Griffin, female    DOB: Jul 06, 1950, 67 y.o.   MRN: [id number]  Pt presents to the office today with complaints of cough. States she lost her husband last month and her mother is in the ICU with RSV. Cough  This is a new problem. The current episode started in the past 7 days. The problem has been gradually worsening. The problem occurs every few minutes. Associated symptoms include a fever, headaches (dull), nasal congestion, rhinorrhea, a sore throat and wheezing. Pertinent negatives include no chills, ear congestion, ear pain or myalgias. The symptoms are aggravated by lying down. She has tried rest (augmentin) for the symptoms. The treatment provided mild relief. There is no history of asthma or COPD.      Review of Systems  Constitutional: Positive for fever. Negative for chills.  HENT: Positive for rhinorrhea and sore throat. Negative for ear pain.   Respiratory: Positive for cough and wheezing.   Musculoskeletal: Negative for myalgias.  Neurological: Positive for headaches (dull).  All other systems reviewed and are negative.      Objective:   Physical Exam  Constitutional: She is oriented to person, place, and time. She appears well-developed and well-nourished. No distress.  HENT:  Head: Normocephalic and atraumatic.  Right Ear: External ear normal. Tympanic membrane is bulging.  Left Ear: Tympanic membrane is bulging.  Nose: Rhinorrhea present.  Mouth/Throat: Posterior oropharyngeal erythema present.  Eyes: Pupils are equal, round, and reactive to light.  Neck: Normal range of motion. Neck supple. No thyromegaly present.  Cardiovascular: Normal rate, regular rhythm, normal heart sounds and intact distal pulses.  No murmur heard. Pulmonary/Chest: Effort normal and breath sounds normal. No respiratory distress. She has no wheezes.  Abdominal: Soft. Bowel sounds are normal. She exhibits no distension. There is no tenderness.    Musculoskeletal: Normal range of motion. She exhibits no edema or tenderness.  Neurological: She is alert and oriented to person, place, and time.  Skin: Skin is warm and dry.  Psychiatric: She has a normal mood and affect. Her behavior is normal. Judgment and thought content normal.  Vitals reviewed.    BP 134/71   Pulse 71   Temp 99.4 F (37.4 C) (Oral)   Ht 5\' 8"  (1.727 m)   Wt 280 lb 3.2 oz (127.1 kg)   BMI 42.60 kg/m      Assessment & Plan:  1. Flu-like symptoms Continue Augmentin Rest Force fluids RTO prn or if symptoms do not improve or worsen - Veritor Flu A/B Waived - Rapid Strep Screen (Not at [facility]) -Hycodan Prescription sent to pharmacy     Evelina Dun, [name]

## 2017-03-22 ENCOUNTER — Encounter (INDEPENDENT_AMBULATORY_CARE_PROVIDER_SITE_OTHER): Payer: Medicare Other | Admitting: Ophthalmology

## 2017-03-22 DIAGNOSIS — D3132 Benign neoplasm of left choroid: Secondary | ICD-10-CM

## 2017-03-22 DIAGNOSIS — E113513 Type 2 diabetes mellitus with proliferative diabetic retinopathy with macular edema, bilateral: Secondary | ICD-10-CM | POA: Diagnosis not present

## 2017-03-22 DIAGNOSIS — E11311 Type 2 diabetes mellitus with unspecified diabetic retinopathy with macular edema: Secondary | ICD-10-CM | POA: Diagnosis not present

## 2017-03-22 DIAGNOSIS — H35033 Hypertensive retinopathy, bilateral: Secondary | ICD-10-CM | POA: Diagnosis not present

## 2017-03-22 DIAGNOSIS — I10 Essential (primary) hypertension: Secondary | ICD-10-CM

## 2017-03-22 DIAGNOSIS — H43813 Vitreous degeneration, bilateral: Secondary | ICD-10-CM

## 2017-03-22 DIAGNOSIS — H2513 Age-related nuclear cataract, bilateral: Secondary | ICD-10-CM

## 2017-04-18 ENCOUNTER — Encounter (INDEPENDENT_AMBULATORY_CARE_PROVIDER_SITE_OTHER): Payer: Medicare Other | Admitting: Ophthalmology

## 2017-04-18 DIAGNOSIS — E113513 Type 2 diabetes mellitus with proliferative diabetic retinopathy with macular edema, bilateral: Secondary | ICD-10-CM

## 2017-04-18 DIAGNOSIS — E11311 Type 2 diabetes mellitus with unspecified diabetic retinopathy with macular edema: Secondary | ICD-10-CM

## 2017-04-18 DIAGNOSIS — I10 Essential (primary) hypertension: Secondary | ICD-10-CM | POA: Diagnosis not present

## 2017-04-18 DIAGNOSIS — H2513 Age-related nuclear cataract, bilateral: Secondary | ICD-10-CM

## 2017-04-18 DIAGNOSIS — H35033 Hypertensive retinopathy, bilateral: Secondary | ICD-10-CM

## 2017-04-18 DIAGNOSIS — D3132 Benign neoplasm of left choroid: Secondary | ICD-10-CM | POA: Diagnosis not present

## 2017-04-18 DIAGNOSIS — H43813 Vitreous degeneration, bilateral: Secondary | ICD-10-CM

## 2017-04-18 LAB — HM DIABETES EYE EXAM

## 2017-04-19 ENCOUNTER — Encounter (INDEPENDENT_AMBULATORY_CARE_PROVIDER_SITE_OTHER): Payer: Medicare Other | Admitting: Ophthalmology

## 2017-05-17 ENCOUNTER — Encounter (INDEPENDENT_AMBULATORY_CARE_PROVIDER_SITE_OTHER): Payer: Medicare Other | Admitting: Ophthalmology

## 2017-05-17 DIAGNOSIS — D3132 Benign neoplasm of left choroid: Secondary | ICD-10-CM

## 2017-05-17 DIAGNOSIS — E113513 Type 2 diabetes mellitus with proliferative diabetic retinopathy with macular edema, bilateral: Secondary | ICD-10-CM

## 2017-05-17 DIAGNOSIS — I10 Essential (primary) hypertension: Secondary | ICD-10-CM | POA: Diagnosis not present

## 2017-05-17 DIAGNOSIS — E11311 Type 2 diabetes mellitus with unspecified diabetic retinopathy with macular edema: Secondary | ICD-10-CM

## 2017-05-17 DIAGNOSIS — H35033 Hypertensive retinopathy, bilateral: Secondary | ICD-10-CM | POA: Diagnosis not present

## 2017-05-17 DIAGNOSIS — H43813 Vitreous degeneration, bilateral: Secondary | ICD-10-CM

## 2017-05-22 ENCOUNTER — Other Ambulatory Visit: Payer: Self-pay | Admitting: *Deleted

## 2017-05-22 DIAGNOSIS — I10 Essential (primary) hypertension: Secondary | ICD-10-CM

## 2017-05-22 MED ORDER — METOPROLOL SUCCINATE ER 100 MG PO TB24: 100.0000 mg | ORAL_TABLET | Freq: Every day | ORAL | 0 refills | Status: DC

## 2017-06-14 ENCOUNTER — Encounter (INDEPENDENT_AMBULATORY_CARE_PROVIDER_SITE_OTHER): Payer: Medicare Other | Admitting: Ophthalmology

## 2017-06-14 DIAGNOSIS — H43813 Vitreous degeneration, bilateral: Secondary | ICD-10-CM | POA: Diagnosis not present

## 2017-06-14 DIAGNOSIS — D3132 Benign neoplasm of left choroid: Secondary | ICD-10-CM | POA: Diagnosis not present

## 2017-06-14 DIAGNOSIS — H2513 Age-related nuclear cataract, bilateral: Secondary | ICD-10-CM | POA: Diagnosis not present

## 2017-06-14 DIAGNOSIS — I10 Essential (primary) hypertension: Secondary | ICD-10-CM

## 2017-06-14 DIAGNOSIS — E11311 Type 2 diabetes mellitus with unspecified diabetic retinopathy with macular edema: Secondary | ICD-10-CM

## 2017-06-14 DIAGNOSIS — H35033 Hypertensive retinopathy, bilateral: Secondary | ICD-10-CM | POA: Diagnosis not present

## 2017-06-14 DIAGNOSIS — E113513 Type 2 diabetes mellitus with proliferative diabetic retinopathy with macular edema, bilateral: Secondary | ICD-10-CM | POA: Diagnosis not present

## 2017-07-12 ENCOUNTER — Encounter (INDEPENDENT_AMBULATORY_CARE_PROVIDER_SITE_OTHER): Payer: Medicare Other | Admitting: Ophthalmology

## 2017-07-12 DIAGNOSIS — E113513 Type 2 diabetes mellitus with proliferative diabetic retinopathy with macular edema, bilateral: Secondary | ICD-10-CM

## 2017-07-12 DIAGNOSIS — H43813 Vitreous degeneration, bilateral: Secondary | ICD-10-CM | POA: Diagnosis not present

## 2017-07-12 DIAGNOSIS — E11311 Type 2 diabetes mellitus with unspecified diabetic retinopathy with macular edema: Secondary | ICD-10-CM

## 2017-07-12 DIAGNOSIS — H35033 Hypertensive retinopathy, bilateral: Secondary | ICD-10-CM

## 2017-07-12 DIAGNOSIS — H2513 Age-related nuclear cataract, bilateral: Secondary | ICD-10-CM

## 2017-07-12 DIAGNOSIS — I10 Essential (primary) hypertension: Secondary | ICD-10-CM

## 2017-07-26 ENCOUNTER — Encounter (INDEPENDENT_AMBULATORY_CARE_PROVIDER_SITE_OTHER): Payer: Medicare Other | Admitting: Ophthalmology

## 2017-07-26 DIAGNOSIS — E113512 Type 2 diabetes mellitus with proliferative diabetic retinopathy with macular edema, left eye: Secondary | ICD-10-CM | POA: Diagnosis not present

## 2017-07-26 DIAGNOSIS — E11311 Type 2 diabetes mellitus with unspecified diabetic retinopathy with macular edema: Secondary | ICD-10-CM

## 2017-07-27 ENCOUNTER — Other Ambulatory Visit: Payer: Self-pay | Admitting: Nurse Practitioner

## 2017-07-27 DIAGNOSIS — I10 Essential (primary) hypertension: Secondary | ICD-10-CM

## 2017-07-27 MED ORDER — METOPROLOL SUCCINATE ER 100 MG PO TB24: 100.0000 mg | ORAL_TABLET | Freq: Every day | ORAL | 0 refills | Status: DC

## 2017-07-27 NOTE — Telephone Encounter (Signed)
What is the name of the medication? Metoprolol succinate 100 mg  Have you contacted your pharmacy to request a refill? NO  Which [facility]   Patient notified that their request is being sent to the clinical staff for review and that they should receive a call once it is complete. If they do not receive a call within 24 hours they can check with their pharmacy or our office.

## 2017-07-27 NOTE — Telephone Encounter (Signed)
Rx sent and patient advised that she needs to be seen.

## 2017-08-09 ENCOUNTER — Encounter (INDEPENDENT_AMBULATORY_CARE_PROVIDER_SITE_OTHER): Payer: Medicare Other | Admitting: Ophthalmology

## 2017-08-09 DIAGNOSIS — E11311 Type 2 diabetes mellitus with unspecified diabetic retinopathy with macular edema: Secondary | ICD-10-CM | POA: Diagnosis not present

## 2017-08-09 DIAGNOSIS — E113513 Type 2 diabetes mellitus with proliferative diabetic retinopathy with macular edema, bilateral: Secondary | ICD-10-CM

## 2017-08-09 DIAGNOSIS — D3132 Benign neoplasm of left choroid: Secondary | ICD-10-CM

## 2017-08-09 DIAGNOSIS — I10 Essential (primary) hypertension: Secondary | ICD-10-CM

## 2017-08-09 DIAGNOSIS — H35033 Hypertensive retinopathy, bilateral: Secondary | ICD-10-CM | POA: Diagnosis not present

## 2017-08-09 DIAGNOSIS — H2513 Age-related nuclear cataract, bilateral: Secondary | ICD-10-CM

## 2017-08-09 DIAGNOSIS — H43813 Vitreous degeneration, bilateral: Secondary | ICD-10-CM

## 2017-08-17 ENCOUNTER — Other Ambulatory Visit: Payer: Self-pay | Admitting: Nurse Practitioner

## 2017-08-17 DIAGNOSIS — E1165 Type 2 diabetes mellitus with hyperglycemia: Secondary | ICD-10-CM

## 2017-08-17 DIAGNOSIS — Z794 Long term (current) use of insulin: Principal | ICD-10-CM

## 2017-08-17 NOTE — Telephone Encounter (Signed)
Last refill without being seen 

## 2017-08-17 NOTE — Telephone Encounter (Signed)
Left message for patient to call to schedule. 

## 2017-08-20 ENCOUNTER — Other Ambulatory Visit: Payer: Self-pay | Admitting: Nurse Practitioner

## 2017-09-03 ENCOUNTER — Encounter (INDEPENDENT_AMBULATORY_CARE_PROVIDER_SITE_OTHER): Payer: Medicare Other | Admitting: Ophthalmology

## 2017-09-03 DIAGNOSIS — H43813 Vitreous degeneration, bilateral: Secondary | ICD-10-CM

## 2017-09-03 DIAGNOSIS — I10 Essential (primary) hypertension: Secondary | ICD-10-CM | POA: Diagnosis not present

## 2017-09-03 DIAGNOSIS — E11311 Type 2 diabetes mellitus with unspecified diabetic retinopathy with macular edema: Secondary | ICD-10-CM

## 2017-09-03 DIAGNOSIS — E113513 Type 2 diabetes mellitus with proliferative diabetic retinopathy with macular edema, bilateral: Secondary | ICD-10-CM

## 2017-09-03 DIAGNOSIS — H2513 Age-related nuclear cataract, bilateral: Secondary | ICD-10-CM

## 2017-09-03 DIAGNOSIS — H35033 Hypertensive retinopathy, bilateral: Secondary | ICD-10-CM | POA: Diagnosis not present

## 2017-09-26 ENCOUNTER — Encounter (INDEPENDENT_AMBULATORY_CARE_PROVIDER_SITE_OTHER): Payer: Medicare Other | Admitting: Ophthalmology

## 2017-09-26 DIAGNOSIS — E113513 Type 2 diabetes mellitus with proliferative diabetic retinopathy with macular edema, bilateral: Secondary | ICD-10-CM | POA: Diagnosis not present

## 2017-09-26 DIAGNOSIS — H2513 Age-related nuclear cataract, bilateral: Secondary | ICD-10-CM

## 2017-09-26 DIAGNOSIS — E11311 Type 2 diabetes mellitus with unspecified diabetic retinopathy with macular edema: Secondary | ICD-10-CM | POA: Diagnosis not present

## 2017-09-26 DIAGNOSIS — D3132 Benign neoplasm of left choroid: Secondary | ICD-10-CM

## 2017-09-26 DIAGNOSIS — I10 Essential (primary) hypertension: Secondary | ICD-10-CM | POA: Diagnosis not present

## 2017-09-26 DIAGNOSIS — H35033 Hypertensive retinopathy, bilateral: Secondary | ICD-10-CM | POA: Diagnosis not present

## 2017-09-26 DIAGNOSIS — H43813 Vitreous degeneration, bilateral: Secondary | ICD-10-CM

## 2017-10-08 ENCOUNTER — Encounter: Payer: Self-pay | Admitting: Nurse Practitioner

## 2017-10-08 ENCOUNTER — Ambulatory Visit: Payer: Medicare Other | Admitting: Nurse Practitioner

## 2017-10-08 VITALS — BP 133/72 | HR 64 | Temp 97.4°F | Ht 68.0 in | Wt 282.0 lb

## 2017-10-08 DIAGNOSIS — Z1211 Encounter for screening for malignant neoplasm of colon: Secondary | ICD-10-CM

## 2017-10-08 DIAGNOSIS — R609 Edema, unspecified: Secondary | ICD-10-CM | POA: Diagnosis not present

## 2017-10-08 DIAGNOSIS — E1165 Type 2 diabetes mellitus with hyperglycemia: Secondary | ICD-10-CM | POA: Diagnosis not present

## 2017-10-08 DIAGNOSIS — E785 Hyperlipidemia, unspecified: Secondary | ICD-10-CM

## 2017-10-08 DIAGNOSIS — Z794 Long term (current) use of insulin: Secondary | ICD-10-CM

## 2017-10-08 DIAGNOSIS — I10 Essential (primary) hypertension: Secondary | ICD-10-CM

## 2017-10-08 DIAGNOSIS — Z1212 Encounter for screening for malignant neoplasm of rectum: Secondary | ICD-10-CM

## 2017-10-08 DIAGNOSIS — K219 Gastro-esophageal reflux disease without esophagitis: Secondary | ICD-10-CM

## 2017-10-08 LAB — BAYER DCA HB A1C WAIVED: HB A1C (BAYER DCA - WAIVED): 9.2 % — ABNORMAL HIGH (ref <7.0)

## 2017-10-08 MED ORDER — LISINOPRIL 20 MG PO TABS: 20.0000 mg | ORAL_TABLET | Freq: Two times a day (BID) | ORAL | 1 refills | Status: DC

## 2017-10-08 MED ORDER — METOPROLOL SUCCINATE ER 100 MG PO TB24: 100.0000 mg | ORAL_TABLET | Freq: Every day | ORAL | 1 refills | Status: DC

## 2017-10-08 MED ORDER — FREESTYLE LIBRE 14 DAY SENSOR MISC: 1.0000 | Freq: Every day | 11 refills | Status: DC

## 2017-10-08 MED ORDER — FREESTYLE LIBRE 14 DAY READER DEVI: 1.0000 | Freq: Every day | 0 refills | Status: DC

## 2017-10-08 MED ORDER — FUROSEMIDE 40 MG PO TABS: 40.0000 mg | ORAL_TABLET | Freq: Every day | ORAL | 1 refills | Status: DC

## 2017-10-08 MED ORDER — INSULIN DEGLUDEC 200 UNIT/ML ~~LOC~~ SOPN: 160.0000 [IU] | PEN_INJECTOR | Freq: Every day | SUBCUTANEOUS | 1 refills | Status: DC

## 2017-10-08 MED ORDER — SITAGLIP PHOS-METFORMIN HCL ER 50-1000 MG PO TB24: 1.0000 | ORAL_TABLET | Freq: Every day | ORAL | 5 refills | Status: DC

## 2017-10-08 MED ORDER — ATORVASTATIN CALCIUM 80 MG PO TABS: 80.0000 mg | ORAL_TABLET | Freq: Every day | ORAL | 11 refills | Status: DC

## 2017-10-08 MED ORDER — INSULIN LISPRO 100 UNIT/ML (KWIKPEN): PEN_INJECTOR | SUBCUTANEOUS | 1 refills | Status: DC

## 2017-10-08 NOTE — Progress Notes (Signed)
Subjective:    Patient ID: Emma Griffin, female    DOB: [birth year], 67 y.o.   MRN: [id number]   Chief Complaint: Medical Management of chronic issues-  HPI:  1. Essential hypertension  No c/o chest pain, sob or headache. Does not check blood pressure at home. BP Readings from Last 3 Encounters:  03/15/17 134/71  03/13/17 (!) 112/58  02/20/17 129/76     2. Gastroesophageal reflux disease without esophagitis  Currently only uses OTC meds as needed.  3. Type 2 diabetes mellitus with hyperglycemia, with long-term current use of insulin (HCC) Last hgba1c was 9.6%. Er blood sugar this morning was 180.  She checks her blood sugars 4x a day and is on sliding scale with all meals.  4. Severe obesity (BMI >= 40) (HCC) weigh is unchanged.  5. Peripheral edema  Edema worsens throughout day and resolves some at night. Never really completely resolves.  6. Hyperlipidemia, unspecified hyperlipidemia type  Not really watching diet at all    Outpatient Encounter Medications as of 10/08/2017  Medication Sig  . ALPRAZolam (XANAX) 0.25 MG tablet Take 1 tablet (0.25 mg total) by mouth 2 (two) times daily as needed for anxiety.  Marland Kitchen amoxicillin-clavulanate (AUGMENTIN) 875-125 MG tablet Take 1 tablet by mouth 2 (two) times daily.  . Ascorbic Acid (VITAMIN C) 1000 MG tablet Take 1,000 mg by mouth daily.  Marland Kitchen aspirin EC 81 MG tablet Take 81 mg by mouth daily.  Marland Kitchen atorvastatin (LIPITOR) 80 MG tablet Take 1 tablet (80 mg total) by mouth daily at 6 PM.  . bevacizumab (AVASTIN) 1.25 mg/0.1 mL SOLN Apply 1.25 mg to eye See admin instructions. Opthalmic injection every 4-6 weeks  . Calcium Carbonate-Vitamin D ([employer]S CALCIUM/VIT D SOFT CHEWS PO) Take 1 tablet by mouth daily.   . fluoruracil (CARAC) 0.5 % cream APPLY TOPICALLY DAILY.  . furosemide (LASIX) 40 MG tablet Take 1 tablet (40 mg total) by mouth daily.  Marland Kitchen gatifloxacin (ZYMAR) 0.3 % ophthalmic drops Place 1 drop into the right eye 4 (four) times daily. Used  for 2 days after surgery  . glucose blood (ONE TOUCH ULTRA TEST) test strip Test 4x a day and prn  Dx. e11.36  . HYDROcodone-homatropine (HYCODAN) 5-1.5 MG/5ML syrup Take 5 mLs by mouth every 8 (eight) hours as needed for cough.  Marland Kitchen ibuprofen (ADVIL,MOTRIN) 200 MG tablet Take 400-600 mg by mouth every 6 (six) hours as needed. For pain/cramps  . insulin lispro (HUMALOG KWIKPEN) 100 UNIT/ML KiwkPen INJECT 40 UNITS UNDER THE SKIN 3 TIMES A DAY  . Insulin Pen Needle (NOVOFINE PLUS) 32G X 4 MM MISC Use with insulin pens to inject insulin 4 times per day  . Lancets (ONETOUCH ULTRASOFT) lancets TEST FOUR TIMES DAILY  . lisinopril (PRINIVIL,ZESTRIL) 20 MG tablet Take 1 tablet (20 mg total) by mouth 2 (two) times daily.  . metoprolol succinate (TOPROL-XL) 100 MG 24 hr tablet Take 1 tablet (100 mg total) by mouth daily.  . Multiple Vitamins-Minerals (AIRBORNE) CHEW Chew 1 tablet by mouth as needed.   . nitroGLYCERIN (NITROSTAT) 0.4 MG SL tablet Place under the tongue.  Marland Kitchen NOVOFINE 32G X 6 MM MISC USE WITH INSULIN PENS TO INJECT INSULIN 4 TIMES PER DAY  . RA KRILL OIL 500 MG CAPS Take 1 capsule by mouth daily.   . SitaGLIPtin-MetFORMIN HCl (JANUMET XR) 50-1000 MG TB24 Take 1 tablet by mouth daily. Take with food  . TRESIBA FLEXTOUCH 200 UNIT/ML SOPN INJECT 160 UNITS INTO THE SKIN DAILY  New complaints:. Patient is very depressed- but she does not want to take an antidepressant.  Depression screen [facility] 2/9 10/08/2017 03/15/2017 03/13/2017  Decreased Interest 1 0 0  Down, Depressed, Hopeless 3 0 0  PHQ - 2 Score 4 0 0  Altered sleeping 0 - -  Tired, decreased energy 2 - -  Change in appetite 3 - -  Feeling bad or failure about yourself  0 - -  Trouble concentrating 0 - -  Moving slowly or fidgety/restless 0 - -  Suicidal thoughts 0 - -  PHQ-9 Score 9 - -  Difficult doing work/chores Somewhat difficult - -       Social history: patient has not been here in over 8 months. Her mom and husband  passed away within a month of each other and she has been grieving. Her son and daughter oin law have moved in with her.    Review of Systems  Constitutional: Negative for activity change and appetite change.  HENT: Negative.   Eyes: Negative for pain.  Respiratory: Negative for shortness of breath.   Cardiovascular: Negative for chest pain, palpitations and leg swelling.  Gastrointestinal: Negative for abdominal pain.  Endocrine: Negative for polydipsia.  Genitourinary: Negative.   Skin: Negative for rash.  Neurological: Negative for dizziness, weakness and headaches.  Hematological: Does not bruise/bleed easily.  Psychiatric/Behavioral: Negative.   All other systems reviewed and are negative.      Objective:   Physical Exam  Constitutional: She is oriented to person, place, and time. She appears well-developed and well-nourished.  HENT:  Head: Normocephalic.  Nose: Nose normal.  Mouth/Throat: Oropharynx is clear and moist.  Eyes: Pupils are equal, round, and reactive to light. EOM are normal.  Neck: Normal range of motion. Neck supple. No JVD present. Carotid bruit is not present.  Cardiovascular: Normal rate, regular rhythm, normal heart sounds and intact distal pulses.  Pulmonary/Chest: Effort normal and breath sounds normal. No respiratory distress. She has no wheezes. She has no rales. She exhibits no tenderness.  Abdominal: Soft. Normal appearance, normal aorta and bowel sounds are normal. She exhibits no distension, no abdominal bruit, no pulsatile midline mass and no mass. There is no splenomegaly or hepatomegaly. There is no tenderness.  Musculoskeletal: Normal range of motion. She exhibits no edema.  Lymphadenopathy:    She has no cervical adenopathy.  Neurological: She is alert and oriented to person, place, and time. She has normal reflexes.  Skin: Skin is warm and dry.  Psychiatric: She has a normal mood and affect. Her behavior is normal. Judgment and thought  content normal.   BP 133/72   Pulse 64   Temp (!) 97.4 F (36.3 C) (Oral)   Ht '5\' 8"'  (1.727 m)   Wt 282 lb (127.9 kg)   BMI 42.88 kg/m    hgba1c 9.2%-      Assessment & Plan:  Chevette Fee comes in today with chief complaint of Medical Management of Chronic Issues   Diagnosis and orders addressed:  1. Essential hypertension Low sodium diet - CMP14+EGFR - metoprolol succinate (TOPROL-XL) 100 MG 24 hr tablet; Take 1 tablet (100 mg total) by mouth daily.  Dispense: 90 tablet; Refill: 1 - lisinopril (PRINIVIL,ZESTRIL) 20 MG tablet; Take 1 tablet (20 mg total) by mouth 2 (two) times daily.  Dispense: 180 tablet; Refill: 1  2. Gastroesophageal reflux disease without esophagitis Avoid spicy foods Do not eat 2 hours prior to bedtime  3. Type 2 diabetes mellitus with hyperglycemia,  with long-term current use of insulin (HCC) stricter carb counting Keep diary of blood sugars with new libre system.- Bayer DCA Hb A1c Waived - Microalbumin / creatinine urine ratio - Insulin Degludec (TRESIBA FLEXTOUCH) 200 UNIT/ML SOPN; Inject 160 Units into the skin daily.  Dispense: 100 mL; Refill: 1 - SitaGLIPtin-MetFORMIN HCl (JANUMET XR) 50-1000 MG TB24; Take 1 tablet by mouth daily. Take with food  Dispense: 30 tablet; Refill: 5 - insulin lispro (HUMALOG KWIKPEN) 100 UNIT/ML KiwkPen; INJECT 40 UNITS UNDER THE SKIN 3 TIMES A DAY  Dispense: 120 mL; Refill: 1 - Continuous Blood Gluc Sensor (FREESTYLE LIBRE 14 DAY SENSOR) MISC; 1 each by Does not apply route daily.  Dispense: 2 each; Refill: 11 - Continuous Blood Gluc Receiver (FREESTYLE LIBRE 14 DAY READER) DEVI; 1 each by Does not apply route daily.  Dispense: 1 Device; Refill: 0  4. Peripheral edema Elevate legs when sitting - furosemide (LASIX) 40 MG tablet; Take 1 tablet (40 mg total) by mouth daily.  Dispense: 90 tablet; Refill: 1  5. Hyperlipidemia, unspecified hyperlipidemia type Low fat diet - Lipid panel - atorvastatin (LIPITOR) 80 MG  tablet; Take 1 tablet (80 mg total) by mouth daily at 6 PM.  Dispense: 30 tablet; Refill: 11  6. Morbid obesity ([place]) Discussed diet and exercise for person with BMI >25 Will recheck weight in 3-6 months    Labs pending Health Maintenance reviewed Diet and exercise encouraged  Follow up plan: 3  months   Mary-Margaret Hassell Done, [name]

## 2017-10-08 NOTE — Patient Instructions (Signed)

## 2017-10-09 LAB — CMP14+EGFR
ALT: 27 IU/L (ref 0–32)
AST: 20 IU/L (ref 0–40)
Albumin/Globulin Ratio: 1 — ABNORMAL LOW (ref 1.2–2.2)
Albumin: 3.9 g/dL (ref 3.6–4.8)
Alkaline Phosphatase: 108 IU/L (ref 39–117)
BUN/Creatinine Ratio: 22 (ref 12–28)
BUN: 21 mg/dL (ref 8–27)
Bilirubin Total: 0.3 mg/dL (ref 0.0–1.2)
CO2: 26 mmol/L (ref 20–29)
Calcium: 9.3 mg/dL (ref 8.7–10.3)
Chloride: 99 mmol/L (ref 96–106)
Creatinine, Ser: 0.96 mg/dL (ref 0.57–1.00)
GFR calc Af Amer: 71 mL/min/{1.73_m2} (ref >59)
GFR calc non Af Amer: 61 mL/min/{1.73_m2} (ref >59)
Globulin, Total: 3.8 g/dL (ref 1.5–4.5)
Glucose: 148 mg/dL — ABNORMAL HIGH (ref 65–99)
Potassium: 4.1 mmol/L (ref 3.5–5.2)
Sodium: 139 mmol/L (ref 134–144)
Total Protein: 7.7 g/dL (ref 6.0–8.5)

## 2017-10-09 LAB — LIPID PANEL
Chol/HDL Ratio: 4.8 ratio — ABNORMAL HIGH (ref 0.0–4.4)
Cholesterol, Total: 153 mg/dL (ref 100–199)
HDL: 32 mg/dL — ABNORMAL LOW (ref >39)
LDL Calculated: 68 mg/dL (ref 0–99)
Triglycerides: 263 mg/dL — ABNORMAL HIGH (ref 0–149)
VLDL Cholesterol Cal: 53 mg/dL — ABNORMAL HIGH (ref 5–40)

## 2017-10-17 ENCOUNTER — Telehealth: Payer: Self-pay | Admitting: Nurse Practitioner

## 2017-10-18 NOTE — Telephone Encounter (Signed)
Last labs 10-08-17 mailed to patient

## 2017-10-22 ENCOUNTER — Encounter (INDEPENDENT_AMBULATORY_CARE_PROVIDER_SITE_OTHER): Payer: Medicare Other | Admitting: Ophthalmology

## 2017-10-22 DIAGNOSIS — E11311 Type 2 diabetes mellitus with unspecified diabetic retinopathy with macular edema: Secondary | ICD-10-CM | POA: Diagnosis not present

## 2017-10-22 DIAGNOSIS — H43813 Vitreous degeneration, bilateral: Secondary | ICD-10-CM

## 2017-10-22 DIAGNOSIS — H35033 Hypertensive retinopathy, bilateral: Secondary | ICD-10-CM

## 2017-10-22 DIAGNOSIS — I10 Essential (primary) hypertension: Secondary | ICD-10-CM | POA: Diagnosis not present

## 2017-10-22 DIAGNOSIS — E113513 Type 2 diabetes mellitus with proliferative diabetic retinopathy with macular edema, bilateral: Secondary | ICD-10-CM

## 2017-10-22 DIAGNOSIS — D3132 Benign neoplasm of left choroid: Secondary | ICD-10-CM

## 2017-10-22 DIAGNOSIS — H2513 Age-related nuclear cataract, bilateral: Secondary | ICD-10-CM

## 2017-10-23 ENCOUNTER — Encounter: Payer: Self-pay | Admitting: *Deleted

## 2017-11-16 ENCOUNTER — Other Ambulatory Visit: Payer: Self-pay | Admitting: Nurse Practitioner

## 2017-11-19 ENCOUNTER — Encounter (INDEPENDENT_AMBULATORY_CARE_PROVIDER_SITE_OTHER): Payer: Medicare Other | Admitting: Ophthalmology

## 2017-11-19 DIAGNOSIS — E11311 Type 2 diabetes mellitus with unspecified diabetic retinopathy with macular edema: Secondary | ICD-10-CM | POA: Diagnosis not present

## 2017-11-19 DIAGNOSIS — H35033 Hypertensive retinopathy, bilateral: Secondary | ICD-10-CM

## 2017-11-19 DIAGNOSIS — H43813 Vitreous degeneration, bilateral: Secondary | ICD-10-CM

## 2017-11-19 DIAGNOSIS — I10 Essential (primary) hypertension: Secondary | ICD-10-CM | POA: Diagnosis not present

## 2017-11-19 DIAGNOSIS — E113513 Type 2 diabetes mellitus with proliferative diabetic retinopathy with macular edema, bilateral: Secondary | ICD-10-CM

## 2017-11-19 DIAGNOSIS — H2513 Age-related nuclear cataract, bilateral: Secondary | ICD-10-CM

## 2017-11-22 ENCOUNTER — Other Ambulatory Visit: Payer: Self-pay | Admitting: *Deleted

## 2017-11-22 DIAGNOSIS — E1136 Type 2 diabetes mellitus with diabetic cataract: Secondary | ICD-10-CM

## 2017-11-22 DIAGNOSIS — IMO0002 Reserved for concepts with insufficient information to code with codable children: Secondary | ICD-10-CM

## 2017-11-22 DIAGNOSIS — E1165 Type 2 diabetes mellitus with hyperglycemia: Secondary | ICD-10-CM

## 2017-11-22 DIAGNOSIS — Z794 Long term (current) use of insulin: Principal | ICD-10-CM

## 2017-11-22 MED ORDER — SITAGLIP PHOS-METFORMIN HCL ER 50-1000 MG PO TB24: 1.0000 | ORAL_TABLET | Freq: Every day | ORAL | 0 refills | Status: DC

## 2017-11-22 MED ORDER — GLUCOSE BLOOD VI STRP
ORAL_STRIP | 3 refills | Status: DC
Start: 2017-11-22 — End: 2018-10-07

## 2017-11-22 MED ORDER — ASPIRIN 81 MG PO CHEW: 81.0000 mg | CHEWABLE_TABLET | Freq: Every day | ORAL | 1 refills | Status: DC

## 2017-12-17 ENCOUNTER — Encounter (INDEPENDENT_AMBULATORY_CARE_PROVIDER_SITE_OTHER): Payer: Medicare Other | Admitting: Ophthalmology

## 2017-12-17 DIAGNOSIS — H35033 Hypertensive retinopathy, bilateral: Secondary | ICD-10-CM | POA: Diagnosis not present

## 2017-12-17 DIAGNOSIS — I10 Essential (primary) hypertension: Secondary | ICD-10-CM | POA: Diagnosis not present

## 2017-12-17 DIAGNOSIS — D3132 Benign neoplasm of left choroid: Secondary | ICD-10-CM

## 2017-12-17 DIAGNOSIS — H2513 Age-related nuclear cataract, bilateral: Secondary | ICD-10-CM

## 2017-12-17 DIAGNOSIS — H43813 Vitreous degeneration, bilateral: Secondary | ICD-10-CM

## 2017-12-17 DIAGNOSIS — E113513 Type 2 diabetes mellitus with proliferative diabetic retinopathy with macular edema, bilateral: Secondary | ICD-10-CM

## 2017-12-17 DIAGNOSIS — E11311 Type 2 diabetes mellitus with unspecified diabetic retinopathy with macular edema: Secondary | ICD-10-CM

## 2018-01-15 ENCOUNTER — Encounter (INDEPENDENT_AMBULATORY_CARE_PROVIDER_SITE_OTHER): Payer: Medicare Other | Admitting: Ophthalmology

## 2018-01-15 DIAGNOSIS — E11311 Type 2 diabetes mellitus with unspecified diabetic retinopathy with macular edema: Secondary | ICD-10-CM | POA: Diagnosis not present

## 2018-01-15 DIAGNOSIS — H43813 Vitreous degeneration, bilateral: Secondary | ICD-10-CM

## 2018-01-15 DIAGNOSIS — H35033 Hypertensive retinopathy, bilateral: Secondary | ICD-10-CM

## 2018-01-15 DIAGNOSIS — E113513 Type 2 diabetes mellitus with proliferative diabetic retinopathy with macular edema, bilateral: Secondary | ICD-10-CM | POA: Diagnosis not present

## 2018-01-15 DIAGNOSIS — D3132 Benign neoplasm of left choroid: Secondary | ICD-10-CM

## 2018-01-15 DIAGNOSIS — I10 Essential (primary) hypertension: Secondary | ICD-10-CM

## 2018-01-15 DIAGNOSIS — H2513 Age-related nuclear cataract, bilateral: Secondary | ICD-10-CM

## 2018-01-18 ENCOUNTER — Ambulatory Visit: Payer: Medicare Other | Admitting: Nurse Practitioner

## 2018-01-18 ENCOUNTER — Encounter: Payer: Self-pay | Admitting: Nurse Practitioner

## 2018-01-18 VITALS — BP 144/81 | HR 69 | Temp 97.9°F | Ht 68.0 in | Wt 273.0 lb

## 2018-01-18 DIAGNOSIS — E1165 Type 2 diabetes mellitus with hyperglycemia: Secondary | ICD-10-CM

## 2018-01-18 DIAGNOSIS — I10 Essential (primary) hypertension: Secondary | ICD-10-CM

## 2018-01-18 DIAGNOSIS — E785 Hyperlipidemia, unspecified: Secondary | ICD-10-CM

## 2018-01-18 DIAGNOSIS — E1136 Type 2 diabetes mellitus with diabetic cataract: Secondary | ICD-10-CM

## 2018-01-18 DIAGNOSIS — E782 Mixed hyperlipidemia: Secondary | ICD-10-CM | POA: Diagnosis not present

## 2018-01-18 DIAGNOSIS — K219 Gastro-esophageal reflux disease without esophagitis: Secondary | ICD-10-CM

## 2018-01-18 DIAGNOSIS — R609 Edema, unspecified: Secondary | ICD-10-CM

## 2018-01-18 DIAGNOSIS — Z794 Long term (current) use of insulin: Secondary | ICD-10-CM

## 2018-01-18 DIAGNOSIS — IMO0002 Reserved for concepts with insufficient information to code with codable children: Secondary | ICD-10-CM

## 2018-01-18 LAB — BAYER DCA HB A1C WAIVED: HB A1C (BAYER DCA - WAIVED): 8.8 % — ABNORMAL HIGH (ref <7.0)

## 2018-01-18 MED ORDER — FUROSEMIDE 40 MG PO TABS: 40.0000 mg | ORAL_TABLET | Freq: Every day | ORAL | 1 refills | Status: DC

## 2018-01-18 MED ORDER — METOPROLOL SUCCINATE ER 100 MG PO TB24: 100.0000 mg | ORAL_TABLET | Freq: Every day | ORAL | 1 refills | Status: DC

## 2018-01-18 MED ORDER — SITAGLIP PHOS-METFORMIN HCL ER 50-1000 MG PO TB24: 1.0000 | ORAL_TABLET | Freq: Every day | ORAL | 0 refills | Status: DC

## 2018-01-18 MED ORDER — INSULIN DEGLUDEC 200 UNIT/ML ~~LOC~~ SOPN: 140.0000 [IU] | PEN_INJECTOR | Freq: Every day | SUBCUTANEOUS | 1 refills | Status: DC

## 2018-01-18 MED ORDER — ATORVASTATIN CALCIUM 80 MG PO TABS: 80.0000 mg | ORAL_TABLET | Freq: Every day | ORAL | 11 refills | Status: DC

## 2018-01-18 MED ORDER — LISINOPRIL 20 MG PO TABS: 20.0000 mg | ORAL_TABLET | Freq: Two times a day (BID) | ORAL | 1 refills | Status: DC

## 2018-01-18 NOTE — Progress Notes (Signed)
Subjective:    Patient ID: Emma Griffin, female    DOB: 04/25/1950, 67 y.o.   MRN: [id number]   Chief Complaint: medical management of chronic issues   HPI:  1. Type 2 diabetes mellitus with hyperglycemia, with long-term current use of insulin (HCC) Last hgba1c was  9.2% which was an improvement from 9.6% previously. She was put on libre to check blood sugars. No changes were made to meds at last visit. Her blood sugars are slowly improving. Insurance would not cover the [place] device.  2. Mixed hyperlipidemia  Does not watch diet very closely. Does very little exercise.  3. Essential hypertension  No c/o chest pain, sob or headache. Does not check blood pressure at home. BP Readings from Last 3 Encounters:  10/08/17 133/72  03/15/17 134/71  03/13/17 (!) 112/58     4. Gastroesophageal reflux disease without esophagitis  Has no complaints with stomach . Is currently on no prescription meds for this.  5. Type 2 diabetes, uncontrolled, with diabetic cataract ([place])  She has done nothing for her cataracts. She see eye doctor every 3 months  6. Morbid obesity ([place])  Weight down 2 lbs since last visit  7. Peripheral edema  Has edema daily but is worse when she is on her feet a lot.she does take lasix daily which helps.    Outpatient Encounter Medications as of 01/18/2018  Medication Sig  . Ascorbic Acid (VITAMIN C) 1000 MG tablet Take 1,000 mg by mouth daily.  Marland Kitchen aspirin 81 MG chewable tablet Chew 1 tablet (81 mg total) by mouth daily.  Marland Kitchen atorvastatin (LIPITOR) 80 MG tablet Take 1 tablet (80 mg total) by mouth daily at 6 PM.  . bevacizumab (AVASTIN) 1.25 mg/0.1 mL SOLN Apply 1.25 mg to eye See admin instructions. Opthalmic injection every 4-6 weeks  . Calcium Carbonate-Vitamin D (CVS CALCIUM/VIT D SOFT CHEWS PO) Take 1 tablet by mouth daily.   . Continuous Blood Gluc Receiver (FREESTYLE LIBRE 14 DAY READER) DEVI 1 each by Does not apply route daily.  . Continuous Blood Gluc Sensor  (FREESTYLE LIBRE 14 DAY SENSOR) MISC 1 each by Does not apply route daily.  . furosemide (LASIX) 40 MG tablet Take 1 tablet (40 mg total) by mouth daily.  Marland Kitchen gatifloxacin (ZYMAR) 0.3 % ophthalmic drops Place 1 drop into the right eye 4 (four) times daily. Used for 2 days after surgery  . glucose blood (ONE TOUCH ULTRA TEST) test strip Test 4x a day and prn  Dx. e11.36  . ibuprofen (ADVIL,MOTRIN) 200 MG tablet Take 400-600 mg by mouth every 6 (six) hours as needed. For pain/cramps  . Insulin Degludec (TRESIBA FLEXTOUCH) 200 UNIT/ML SOPN Inject 160 Units into the skin daily.  . insulin lispro (HUMALOG KWIKPEN) 100 UNIT/ML KiwkPen INJECT 40 UNITS UNDER THE SKIN 3 TIMES A DAY  . Lancets (ONETOUCH ULTRASOFT) lancets TEST FOUR TIMES DAILY  . lisinopril (PRINIVIL,ZESTRIL) 20 MG tablet Take 1 tablet (20 mg total) by mouth 2 (two) times daily.  . metoprolol succinate (TOPROL-XL) 100 MG 24 hr tablet Take 1 tablet (100 mg total) by mouth daily.  . Multiple Vitamins-Minerals (AIRBORNE) CHEW Chew 1 tablet by mouth as needed.   . nitroGLYCERIN (NITROSTAT) 0.4 MG SL tablet Place under the tongue.  Marland Kitchen NOVOFINE 32G X 6 MM MISC USE WITH INSULIN PENS TO INJECT INSULIN 4 TIMES PER DAY  . SitaGLIPtin-MetFORMIN HCl (JANUMET XR) 50-1000 MG TB24 Take 1 tablet by mouth daily. Take with food  New complaints:  none today  Social history: Lives with her son since her hunband passed away. Her mom has also passed away    Review of Systems  Constitutional: Negative for activity change and appetite change.  HENT: Negative.   Eyes: Negative for pain.  Respiratory: Negative for shortness of breath.   Cardiovascular: Negative for chest pain, palpitations and leg swelling.  Gastrointestinal: Negative for abdominal pain.  Endocrine: Negative for polydipsia.  Genitourinary: Negative.   Skin: Negative for rash.  Neurological: Negative for dizziness, weakness and headaches.  Hematological: Does not bruise/bleed  easily.  Psychiatric/Behavioral: Negative.   All other systems reviewed and are negative.      Objective:   Physical Exam  Constitutional: She is oriented to person, place, and time. She appears well-developed and well-nourished. No distress.  HENT:  Head: Normocephalic.  Nose: Nose normal.  Mouth/Throat: Oropharynx is clear and moist.  Multiple dental caries  Eyes: Pupils are equal, round, and reactive to light. EOM are normal.  Neck: Normal range of motion. Neck supple. No JVD present. Carotid bruit is not present.  Cardiovascular: Normal rate, regular rhythm, normal heart sounds and intact distal pulses.  Pulmonary/Chest: Effort normal and breath sounds normal. No respiratory distress. She has no wheezes. She has no rales. She exhibits no tenderness.  Abdominal: Soft. Normal appearance, normal aorta and bowel sounds are normal. She exhibits no distension, no abdominal bruit, no pulsatile midline mass and no mass. There is no splenomegaly or hepatomegaly. There is no tenderness.  Musculoskeletal: Normal range of motion. She exhibits edema (2/6 systolic murmur).  Lymphadenopathy:    She has no cervical adenopathy.  Neurological: She is alert and oriented to person, place, and time. She has normal reflexes.  Skin: Skin is warm and dry.  Psychiatric: She has a normal mood and affect. Her behavior is normal. Judgment and thought content normal.  Nursing note and vitals reviewed.   BP (!) 144/81   Pulse 69   Temp 97.9 F (36.6 C) (Oral)   Ht '5\' 8"'  (1.727 m)   Wt 273 lb (123.8 kg)   BMI 41.51 kg/m    hgba1c 8.8%      Assessment & Plan:  Yesha Muchow comes in today with chief complaint of Medical Management of Chronic Issues   Diagnosis and orders addressed:  1. Type 2 diabetes mellitus with hyperglycemia, with long-term current use of insulin (HCC) Increase tresiba from 130u to 140 units daily Continue to watch carbs in diet - Bayer DCA Hb A1c Waived - Microalbumin /  creatinine urine ratio - SitaGLIPtin-MetFORMIN HCl (JANUMET XR) 50-1000 MG TB24; Take 1 tablet by mouth daily. Take with food  Dispense: 90 tablet; Refill: 0 - Insulin Degludec (TRESIBA FLEXTOUCH) 200 UNIT/ML SOPN; Inject 140 Units into the skin daily.  Dispense: 100 mL; Refill: 1  2. Mixed hyperlipidemia Low fat diet - Lipid panel  3. Essential hypertension Low sodium diet - CMP14+EGFR - lisinopril (PRINIVIL,ZESTRIL) 20 MG tablet; Take 1 tablet (20 mg total) by mouth 2 (two) times daily.  Dispense: 180 tablet; Refill: 1 - metoprolol succinate (TOPROL-XL) 100 MG 24 hr tablet; Take 1 tablet (100 mg total) by mouth daily.  Dispense: 90 tablet; Refill: 1  4. Gastroesophageal reflux disease without esophagitis Avoid spicy foods Do not eat 2 hours prior to bedtime  5. Type 2 diabetes, uncontrolled, with diabetic cataract ([place]) Encouraged to see someone about cataracts  6. Morbid obesity ([place]) Discussed diet and exercise for person with BMI >25  Will recheck weight in 3-6 months  7. Peripheral edema elevat elegs when sitting - furosemide (LASIX) 40 MG tablet; Take 1 tablet (40 mg total) by mouth daily.  Dispense: 90 tablet; Refill: 1  8. Hyperlipidemia, unspecified hyperlipidemia type Low fat diet - atorvastatin (LIPITOR) 80 MG tablet; Take 1 tablet (80 mg total) by mouth daily at 6 PM.  Dispense: 30 tablet; Refill: 11   Labs pending Health Maintenance reviewed Diet and exercise encouraged  Follow up plan: 3 months   Mary-Margaret Hassell Done, [name]

## 2018-01-18 NOTE — Patient Instructions (Signed)

## 2018-01-19 LAB — CMP14+EGFR
ALT: 22 IU/L (ref 0–32)
AST: 16 IU/L (ref 0–40)
Albumin/Globulin Ratio: 1.1 — ABNORMAL LOW (ref 1.2–2.2)
Albumin: 3.9 g/dL (ref 3.6–4.8)
Alkaline Phosphatase: 105 IU/L (ref 39–117)
BUN/Creatinine Ratio: 27 (ref 12–28)
BUN: 28 mg/dL — ABNORMAL HIGH (ref 8–27)
Bilirubin Total: 0.5 mg/dL (ref 0.0–1.2)
CO2: 25 mmol/L (ref 20–29)
Calcium: 9.3 mg/dL (ref 8.7–10.3)
Chloride: 101 mmol/L (ref 96–106)
Creatinine, Ser: 1.05 mg/dL — ABNORMAL HIGH (ref 0.57–1.00)
GFR calc Af Amer: 63 mL/min/{1.73_m2} (ref >59)
GFR calc non Af Amer: 55 mL/min/{1.73_m2} — ABNORMAL LOW (ref >59)
Globulin, Total: 3.6 g/dL (ref 1.5–4.5)
Glucose: 118 mg/dL — ABNORMAL HIGH (ref 65–99)
Potassium: 4.6 mmol/L (ref 3.5–5.2)
Sodium: 143 mmol/L (ref 134–144)
Total Protein: 7.5 g/dL (ref 6.0–8.5)

## 2018-01-19 LAB — LIPID PANEL
Chol/HDL Ratio: 4.4 ratio (ref 0.0–4.4)
Cholesterol, Total: 132 mg/dL (ref 100–199)
HDL: 30 mg/dL — ABNORMAL LOW (ref >39)
LDL Calculated: 54 mg/dL (ref 0–99)
Triglycerides: 238 mg/dL — ABNORMAL HIGH (ref 0–149)
VLDL Cholesterol Cal: 48 mg/dL — ABNORMAL HIGH (ref 5–40)

## 2018-02-13 ENCOUNTER — Encounter (INDEPENDENT_AMBULATORY_CARE_PROVIDER_SITE_OTHER): Payer: Medicare Other | Admitting: Ophthalmology

## 2018-02-13 DIAGNOSIS — E113513 Type 2 diabetes mellitus with proliferative diabetic retinopathy with macular edema, bilateral: Secondary | ICD-10-CM

## 2018-02-13 DIAGNOSIS — D3132 Benign neoplasm of left choroid: Secondary | ICD-10-CM | POA: Diagnosis not present

## 2018-02-13 DIAGNOSIS — E11311 Type 2 diabetes mellitus with unspecified diabetic retinopathy with macular edema: Secondary | ICD-10-CM

## 2018-02-13 DIAGNOSIS — I10 Essential (primary) hypertension: Secondary | ICD-10-CM

## 2018-02-13 DIAGNOSIS — H2513 Age-related nuclear cataract, bilateral: Secondary | ICD-10-CM

## 2018-02-13 DIAGNOSIS — H43813 Vitreous degeneration, bilateral: Secondary | ICD-10-CM

## 2018-03-14 ENCOUNTER — Encounter (INDEPENDENT_AMBULATORY_CARE_PROVIDER_SITE_OTHER): Payer: Medicare Other | Admitting: Ophthalmology

## 2018-03-14 DIAGNOSIS — H2513 Age-related nuclear cataract, bilateral: Secondary | ICD-10-CM

## 2018-03-14 DIAGNOSIS — E113513 Type 2 diabetes mellitus with proliferative diabetic retinopathy with macular edema, bilateral: Secondary | ICD-10-CM | POA: Diagnosis not present

## 2018-03-14 DIAGNOSIS — E11311 Type 2 diabetes mellitus with unspecified diabetic retinopathy with macular edema: Secondary | ICD-10-CM

## 2018-03-14 DIAGNOSIS — D3132 Benign neoplasm of left choroid: Secondary | ICD-10-CM

## 2018-03-14 DIAGNOSIS — H35033 Hypertensive retinopathy, bilateral: Secondary | ICD-10-CM

## 2018-03-14 DIAGNOSIS — I10 Essential (primary) hypertension: Secondary | ICD-10-CM | POA: Diagnosis not present

## 2018-03-14 DIAGNOSIS — H43813 Vitreous degeneration, bilateral: Secondary | ICD-10-CM

## 2018-04-04 ENCOUNTER — Telehealth: Payer: Self-pay | Admitting: Nurse Practitioner

## 2018-04-11 ENCOUNTER — Encounter (INDEPENDENT_AMBULATORY_CARE_PROVIDER_SITE_OTHER): Payer: Medicare Other | Admitting: Ophthalmology

## 2018-04-11 DIAGNOSIS — E11311 Type 2 diabetes mellitus with unspecified diabetic retinopathy with macular edema: Secondary | ICD-10-CM | POA: Diagnosis not present

## 2018-04-11 DIAGNOSIS — D3132 Benign neoplasm of left choroid: Secondary | ICD-10-CM

## 2018-04-11 DIAGNOSIS — H35033 Hypertensive retinopathy, bilateral: Secondary | ICD-10-CM | POA: Diagnosis not present

## 2018-04-11 DIAGNOSIS — I10 Essential (primary) hypertension: Secondary | ICD-10-CM

## 2018-04-11 DIAGNOSIS — H2513 Age-related nuclear cataract, bilateral: Secondary | ICD-10-CM

## 2018-04-11 DIAGNOSIS — E113513 Type 2 diabetes mellitus with proliferative diabetic retinopathy with macular edema, bilateral: Secondary | ICD-10-CM | POA: Diagnosis not present

## 2018-04-11 DIAGNOSIS — H43813 Vitreous degeneration, bilateral: Secondary | ICD-10-CM

## 2018-04-22 ENCOUNTER — Ambulatory Visit: Payer: Medicare Other | Admitting: Nurse Practitioner

## 2018-04-25 ENCOUNTER — Telehealth: Payer: Self-pay | Admitting: Nurse Practitioner

## 2018-05-09 ENCOUNTER — Encounter (INDEPENDENT_AMBULATORY_CARE_PROVIDER_SITE_OTHER): Payer: Medicare Other | Admitting: Ophthalmology

## 2018-05-09 DIAGNOSIS — H35033 Hypertensive retinopathy, bilateral: Secondary | ICD-10-CM

## 2018-05-09 DIAGNOSIS — E113513 Type 2 diabetes mellitus with proliferative diabetic retinopathy with macular edema, bilateral: Secondary | ICD-10-CM | POA: Diagnosis not present

## 2018-05-09 DIAGNOSIS — I10 Essential (primary) hypertension: Secondary | ICD-10-CM

## 2018-05-09 DIAGNOSIS — H2513 Age-related nuclear cataract, bilateral: Secondary | ICD-10-CM

## 2018-05-09 DIAGNOSIS — D3132 Benign neoplasm of left choroid: Secondary | ICD-10-CM

## 2018-05-09 DIAGNOSIS — E11311 Type 2 diabetes mellitus with unspecified diabetic retinopathy with macular edema: Secondary | ICD-10-CM | POA: Diagnosis not present

## 2018-05-09 DIAGNOSIS — H43813 Vitreous degeneration, bilateral: Secondary | ICD-10-CM

## 2018-05-13 ENCOUNTER — Other Ambulatory Visit: Payer: Self-pay | Admitting: Nurse Practitioner

## 2018-05-13 DIAGNOSIS — E1165 Type 2 diabetes mellitus with hyperglycemia: Secondary | ICD-10-CM

## 2018-05-13 DIAGNOSIS — Z794 Long term (current) use of insulin: Principal | ICD-10-CM

## 2018-05-13 NOTE — Telephone Encounter (Signed)
Last seen 01/18/18

## 2018-06-05 ENCOUNTER — Encounter (INDEPENDENT_AMBULATORY_CARE_PROVIDER_SITE_OTHER): Payer: Medicare Other | Admitting: Ophthalmology

## 2018-06-26 ENCOUNTER — Encounter (INDEPENDENT_AMBULATORY_CARE_PROVIDER_SITE_OTHER): Payer: Medicare Other | Admitting: Ophthalmology

## 2018-06-26 ENCOUNTER — Other Ambulatory Visit: Payer: Self-pay

## 2018-06-26 DIAGNOSIS — H35033 Hypertensive retinopathy, bilateral: Secondary | ICD-10-CM

## 2018-06-26 DIAGNOSIS — D3132 Benign neoplasm of left choroid: Secondary | ICD-10-CM

## 2018-06-26 DIAGNOSIS — E113513 Type 2 diabetes mellitus with proliferative diabetic retinopathy with macular edema, bilateral: Secondary | ICD-10-CM | POA: Diagnosis not present

## 2018-06-26 DIAGNOSIS — E11311 Type 2 diabetes mellitus with unspecified diabetic retinopathy with macular edema: Secondary | ICD-10-CM

## 2018-06-26 DIAGNOSIS — H43813 Vitreous degeneration, bilateral: Secondary | ICD-10-CM

## 2018-06-26 DIAGNOSIS — I10 Essential (primary) hypertension: Secondary | ICD-10-CM | POA: Diagnosis not present

## 2018-06-26 DIAGNOSIS — H2513 Age-related nuclear cataract, bilateral: Secondary | ICD-10-CM

## 2018-07-10 ENCOUNTER — Telehealth: Payer: Self-pay | Admitting: Nurse Practitioner

## 2018-07-22 ENCOUNTER — Other Ambulatory Visit: Payer: Self-pay | Admitting: Nurse Practitioner

## 2018-07-22 DIAGNOSIS — E1165 Type 2 diabetes mellitus with hyperglycemia: Secondary | ICD-10-CM

## 2018-07-24 ENCOUNTER — Ambulatory Visit (INDEPENDENT_AMBULATORY_CARE_PROVIDER_SITE_OTHER): Payer: Medicare Other | Admitting: Nurse Practitioner

## 2018-07-24 ENCOUNTER — Encounter: Payer: Self-pay | Admitting: Nurse Practitioner

## 2018-07-24 ENCOUNTER — Other Ambulatory Visit: Payer: Self-pay

## 2018-07-24 DIAGNOSIS — K219 Gastro-esophageal reflux disease without esophagitis: Secondary | ICD-10-CM | POA: Diagnosis not present

## 2018-07-24 DIAGNOSIS — E785 Hyperlipidemia, unspecified: Secondary | ICD-10-CM | POA: Diagnosis not present

## 2018-07-24 DIAGNOSIS — Z794 Long term (current) use of insulin: Secondary | ICD-10-CM

## 2018-07-24 DIAGNOSIS — R609 Edema, unspecified: Secondary | ICD-10-CM

## 2018-07-24 DIAGNOSIS — I10 Essential (primary) hypertension: Secondary | ICD-10-CM | POA: Diagnosis not present

## 2018-07-24 DIAGNOSIS — E1165 Type 2 diabetes mellitus with hyperglycemia: Secondary | ICD-10-CM

## 2018-07-24 MED ORDER — METOPROLOL SUCCINATE ER 100 MG PO TB24: 100.0000 mg | ORAL_TABLET | Freq: Every day | ORAL | 1 refills | Status: DC

## 2018-07-24 MED ORDER — INSULIN LISPRO (1 UNIT DIAL) 100 UNIT/ML (KWIKPEN): 40.0000 [IU] | PEN_INJECTOR | Freq: Three times a day (TID) | SUBCUTANEOUS | 1 refills | Status: DC

## 2018-07-24 MED ORDER — INSULIN DEGLUDEC 200 UNIT/ML ~~LOC~~ SOPN: 150.0000 [IU] | PEN_INJECTOR | Freq: Every day | SUBCUTANEOUS | 1 refills | Status: DC

## 2018-07-24 MED ORDER — SITAGLIP PHOS-METFORMIN HCL ER 50-1000 MG PO TB24: 1.0000 | ORAL_TABLET | Freq: Every day | ORAL | 1 refills | Status: DC

## 2018-07-24 MED ORDER — FUROSEMIDE 40 MG PO TABS: 40.0000 mg | ORAL_TABLET | Freq: Every day | ORAL | 1 refills | Status: DC

## 2018-07-24 MED ORDER — LISINOPRIL 20 MG PO TABS: 20.0000 mg | ORAL_TABLET | Freq: Two times a day (BID) | ORAL | 1 refills | Status: DC

## 2018-07-24 MED ORDER — ATORVASTATIN CALCIUM 80 MG PO TABS: 80.0000 mg | ORAL_TABLET | Freq: Every day | ORAL | 1 refills | Status: DC

## 2018-07-24 NOTE — Progress Notes (Signed)
Virtual Visit via telephone Note  I connected with Emma Griffin on 07/24/18 at 1:40 PM by telephone and verified that I am speaking with the correct person using two identifiers. Emma Griffin is currently located at home and no one is currently with her during visit. The provider, Mary-Margaret Hassell Done, [name] is located in their office at time of visit.  I discussed the limitations, risks, security and privacy concerns of performing an evaluation and management service by telephone and the availability of in person appointments. I also discussed with the patient that there may be a patient responsible charge related to this service. The patient expressed understanding and agreed to proceed.   History and Present Illness:   Chief Complaint: Medical Management of Chronic Issues    HPI:  1. Essential hypertension No c/o chest pain or headache. She has SOB for awhile but it has not changed. Does not check blood pressure at home. BP Readings from Last 3 Encounters:  01/18/18 (!) 144/81  10/08/17 133/72  03/15/17 134/71     2. Type 2 diabetes mellitus with hyperglycemia, with long-term current use of insulin (HCC) Last hgbac1 was 8.8%. her fasting blood sugars have been running all over the place. Averages in low 200. No hypoglycemia. She checks her blood sugar 4xs a day.  3. Gastroesophageal reflux disease without esophagitis Only uses OTC meds as needed  4. Hyperlipidemia, unspecified hyperlipidemia type Not watching diet and does very little exercise  5. Peripheral edema Has edema bil lower ext improves when she elevates her legs  6. Morbid obesity ([place]) No weight changes    Outpatient Encounter Medications as of 07/24/2018  Medication Sig  . Ascorbic Acid (VITAMIN C) 1000 MG tablet Take 1,000 mg by mouth daily.  Marland Kitchen aspirin 81 MG chewable tablet Chew 1 tablet (81 mg total) by mouth daily.  Marland Kitchen atorvastatin (LIPITOR) 80 MG tablet Take 1 tablet (80 mg total) by mouth daily at 6  PM.  . bevacizumab (AVASTIN) 1.25 mg/0.1 mL SOLN Apply 1.25 mg to eye See admin instructions. Opthalmic injection every 4-6 weeks  . Calcium Carbonate-Vitamin D (CVS CALCIUM/VIT D SOFT CHEWS PO) Take 1 tablet by mouth daily.   . furosemide (LASIX) 40 MG tablet Take 1 tablet (40 mg total) by mouth daily.  Marland Kitchen gatifloxacin (ZYMAR) 0.3 % ophthalmic drops Place 1 drop into the right eye 4 (four) times daily. Used for 2 days after surgery  . glucose blood (ONE TOUCH ULTRA TEST) test strip Test 4x a day and prn  Dx. e11.36  . ibuprofen (ADVIL,MOTRIN) 200 MG tablet Take 400-600 mg by mouth every 6 (six) hours as needed. For pain/cramps  . Insulin Degludec 200 UNIT/ML SOPN INJECT 160 UNITS INTO THE SKIN DAILY.  Marland Kitchen insulin lispro (HUMALOG KWIKPEN) 100 UNIT/ML KwikPen Inject 0.4 mLs (40 Units total) into the skin 3 (three) times daily. (please make 6 mos ckup)  . JANUMET XR 50-1000 MG TB24 TAKE 1 TABLET BY MOUTH EVERY DAY WITH FOOD  . Lancets (ONETOUCH ULTRASOFT) lancets TEST FOUR TIMES DAILY  . lisinopril (PRINIVIL,ZESTRIL) 20 MG tablet Take 1 tablet (20 mg total) by mouth 2 (two) times daily.  . metoprolol succinate (TOPROL-XL) 100 MG 24 hr tablet Take 1 tablet (100 mg total) by mouth daily.  . Multiple Vitamins-Minerals (AIRBORNE) CHEW Chew 1 tablet by mouth as needed.   . nitroGLYCERIN (NITROSTAT) 0.4 MG SL tablet Place under the tongue.  Marland Kitchen NOVOFINE 32G X 6 MM MISC USE WITH INSULIN PENS TO INJECT  INSULIN 4 TIMES PER DAY     New complaints: None today  Social history: Lives with her son     Review of Systems  Constitutional: Negative for diaphoresis and weight loss.  Eyes: Negative for blurred vision, double vision and pain.  Respiratory: Negative for shortness of breath.   Cardiovascular: Negative for chest pain, palpitations, orthopnea and leg swelling.  Gastrointestinal: Negative for abdominal pain.  Skin: Negative for rash.  Neurological: Negative for dizziness, sensory change, loss of  consciousness, weakness and headaches.  Endo/Heme/Allergies: Negative for polydipsia. Does not bruise/bleed easily.  Psychiatric/Behavioral: Negative for memory loss. The patient does not have insomnia.   All other systems reviewed and are negative.    Observations/Objective: Alert and oriented No distress  Assessment and Plan: Emma Griffin comes in today with chief complaint of Medical Management of Chronic Issues   Diagnosis and orders addressed:  1. Essential hypertension Low sodium diet - lisinopril (ZESTRIL) 20 MG tablet; Take 1 tablet (20 mg total) by mouth 2 (two) times daily.  Dispense: 180 tablet; Refill: 1 - metoprolol succinate (TOPROL-XL) 100 MG 24 hr tablet; Take 1 tablet (100 mg total) by mouth daily.  Dispense: 90 tablet; Refill: 1  2. Type 2 diabetes mellitus with hyperglycemia, with long-term current use of insulin (HCC) Increase tresiba from 140u to 150u Will try again to find out about libre - Insulin Degludec 200 UNIT/ML SOPN; Inject 150 Units into the skin daily.  Dispense: 24 pen; Refill: 1 - insulin lispro (HUMALOG KWIKPEN) 100 UNIT/ML KwikPen; Inject 0.4 mLs (40 Units total) into the skin 3 (three) times daily. (please make 6 mos ckup)  Dispense: 120 mL; Refill: 1 - SitaGLIPtin-MetFORMIN HCl (JANUMET XR) 50-1000 MG TB24; Take 1 tablet by mouth daily. with food  Dispense: 90 tablet; Refill: 1  3. Gastroesophageal reflux disease without esophagitis Avoid spicy foods Do not eat 2 hours prior to bedtime  4. Hyperlipidemia, unspecified hyperlipidemia type Low fat diet - atorvastatin (LIPITOR) 80 MG tablet; Take 1 tablet (80 mg total) by mouth daily at 6 PM.  Dispense: 90 tablet; Refill: 1  5. Peripheral edema Elevate legs when sitting - furosemide (LASIX) 40 MG tablet; Take 1 tablet (40 mg total) by mouth daily.  Dispense: 90 tablet; Refill: 1  6. Morbid obesity ([place]) Discussed diet and exercise for person with BMI >25 Will recheck weight in 3-6 months     Labs pending Health Maintenance reviewed Diet and exercise encouraged  Follow up plan: 3 months     I discussed the assessment and treatment plan with the patient. The patient was provided an opportunity to ask questions and all were answered. The patient agreed with the plan and demonstrated an understanding of the instructions.   The patient was advised to call back or seek an in-person evaluation if the symptoms worsen or if the condition fails to improve as anticipated.  The above assessment and management plan was discussed with the patient. The patient verbalized understanding of and has agreed to the management plan. Patient is aware to call the clinic if symptoms persist or worsen. Patient is aware when to return to the clinic for a follow-up visit. Patient educated on when it is appropriate to go to the emergency department.   Time call ended:  2:05  I provided 25 minutes of non-face-to-face time during this encounter.    Mary-Margaret Hassell Done, [name]

## 2018-08-07 ENCOUNTER — Other Ambulatory Visit: Payer: Self-pay | Admitting: Nurse Practitioner

## 2018-08-07 ENCOUNTER — Other Ambulatory Visit: Payer: Self-pay

## 2018-08-07 ENCOUNTER — Encounter (INDEPENDENT_AMBULATORY_CARE_PROVIDER_SITE_OTHER): Payer: Medicare Other | Admitting: Ophthalmology

## 2018-08-07 DIAGNOSIS — H35033 Hypertensive retinopathy, bilateral: Secondary | ICD-10-CM | POA: Diagnosis not present

## 2018-08-07 DIAGNOSIS — H43813 Vitreous degeneration, bilateral: Secondary | ICD-10-CM

## 2018-08-07 DIAGNOSIS — H2513 Age-related nuclear cataract, bilateral: Secondary | ICD-10-CM

## 2018-08-07 DIAGNOSIS — E11311 Type 2 diabetes mellitus with unspecified diabetic retinopathy with macular edema: Secondary | ICD-10-CM | POA: Diagnosis not present

## 2018-08-07 DIAGNOSIS — E113513 Type 2 diabetes mellitus with proliferative diabetic retinopathy with macular edema, bilateral: Secondary | ICD-10-CM | POA: Diagnosis not present

## 2018-08-07 DIAGNOSIS — I10 Essential (primary) hypertension: Secondary | ICD-10-CM

## 2018-08-07 DIAGNOSIS — D3132 Benign neoplasm of left choroid: Secondary | ICD-10-CM

## 2018-09-11 ENCOUNTER — Other Ambulatory Visit: Payer: Self-pay

## 2018-09-11 ENCOUNTER — Encounter (INDEPENDENT_AMBULATORY_CARE_PROVIDER_SITE_OTHER): Payer: Medicare Other | Admitting: Ophthalmology

## 2018-09-11 DIAGNOSIS — H35033 Hypertensive retinopathy, bilateral: Secondary | ICD-10-CM | POA: Diagnosis not present

## 2018-09-11 DIAGNOSIS — I10 Essential (primary) hypertension: Secondary | ICD-10-CM

## 2018-09-11 DIAGNOSIS — E113513 Type 2 diabetes mellitus with proliferative diabetic retinopathy with macular edema, bilateral: Secondary | ICD-10-CM | POA: Diagnosis not present

## 2018-09-11 DIAGNOSIS — E11311 Type 2 diabetes mellitus with unspecified diabetic retinopathy with macular edema: Secondary | ICD-10-CM

## 2018-09-11 DIAGNOSIS — H43813 Vitreous degeneration, bilateral: Secondary | ICD-10-CM

## 2018-09-11 DIAGNOSIS — D3132 Benign neoplasm of left choroid: Secondary | ICD-10-CM

## 2018-09-11 DIAGNOSIS — H2513 Age-related nuclear cataract, bilateral: Secondary | ICD-10-CM

## 2018-10-04 ENCOUNTER — Other Ambulatory Visit: Payer: Self-pay | Admitting: Nurse Practitioner

## 2018-10-04 DIAGNOSIS — IMO0002 Reserved for concepts with insufficient information to code with codable children: Secondary | ICD-10-CM

## 2018-10-04 DIAGNOSIS — E1136 Type 2 diabetes mellitus with diabetic cataract: Secondary | ICD-10-CM

## 2018-10-04 DIAGNOSIS — E1165 Type 2 diabetes mellitus with hyperglycemia: Secondary | ICD-10-CM

## 2018-10-16 ENCOUNTER — Encounter (INDEPENDENT_AMBULATORY_CARE_PROVIDER_SITE_OTHER): Payer: Medicare Other | Admitting: Ophthalmology

## 2018-10-16 ENCOUNTER — Other Ambulatory Visit: Payer: Self-pay

## 2018-10-16 DIAGNOSIS — I10 Essential (primary) hypertension: Secondary | ICD-10-CM | POA: Diagnosis not present

## 2018-10-16 DIAGNOSIS — E113513 Type 2 diabetes mellitus with proliferative diabetic retinopathy with macular edema, bilateral: Secondary | ICD-10-CM | POA: Diagnosis not present

## 2018-10-16 DIAGNOSIS — D3132 Benign neoplasm of left choroid: Secondary | ICD-10-CM

## 2018-10-16 DIAGNOSIS — E11311 Type 2 diabetes mellitus with unspecified diabetic retinopathy with macular edema: Secondary | ICD-10-CM

## 2018-10-16 DIAGNOSIS — H35033 Hypertensive retinopathy, bilateral: Secondary | ICD-10-CM | POA: Diagnosis not present

## 2018-10-16 DIAGNOSIS — H43813 Vitreous degeneration, bilateral: Secondary | ICD-10-CM

## 2018-10-16 DIAGNOSIS — H2513 Age-related nuclear cataract, bilateral: Secondary | ICD-10-CM

## 2018-10-20 ENCOUNTER — Other Ambulatory Visit: Payer: Self-pay | Admitting: Nurse Practitioner

## 2018-11-15 ENCOUNTER — Encounter (INDEPENDENT_AMBULATORY_CARE_PROVIDER_SITE_OTHER): Payer: Medicare Other | Admitting: Ophthalmology

## 2018-11-15 ENCOUNTER — Other Ambulatory Visit: Payer: Self-pay

## 2018-11-15 DIAGNOSIS — H43813 Vitreous degeneration, bilateral: Secondary | ICD-10-CM

## 2018-11-15 DIAGNOSIS — E113513 Type 2 diabetes mellitus with proliferative diabetic retinopathy with macular edema, bilateral: Secondary | ICD-10-CM | POA: Diagnosis not present

## 2018-11-15 DIAGNOSIS — E11311 Type 2 diabetes mellitus with unspecified diabetic retinopathy with macular edema: Secondary | ICD-10-CM | POA: Diagnosis not present

## 2018-11-15 DIAGNOSIS — H35033 Hypertensive retinopathy, bilateral: Secondary | ICD-10-CM

## 2018-11-15 DIAGNOSIS — I10 Essential (primary) hypertension: Secondary | ICD-10-CM

## 2018-11-15 DIAGNOSIS — H2513 Age-related nuclear cataract, bilateral: Secondary | ICD-10-CM

## 2018-11-15 DIAGNOSIS — D3132 Benign neoplasm of left choroid: Secondary | ICD-10-CM

## 2018-12-13 ENCOUNTER — Encounter (INDEPENDENT_AMBULATORY_CARE_PROVIDER_SITE_OTHER): Payer: Medicare Other | Admitting: Ophthalmology

## 2018-12-13 DIAGNOSIS — H43813 Vitreous degeneration, bilateral: Secondary | ICD-10-CM

## 2018-12-13 DIAGNOSIS — I10 Essential (primary) hypertension: Secondary | ICD-10-CM

## 2018-12-13 DIAGNOSIS — D3132 Benign neoplasm of left choroid: Secondary | ICD-10-CM

## 2018-12-13 DIAGNOSIS — H35033 Hypertensive retinopathy, bilateral: Secondary | ICD-10-CM | POA: Diagnosis not present

## 2018-12-13 DIAGNOSIS — E113513 Type 2 diabetes mellitus with proliferative diabetic retinopathy with macular edema, bilateral: Secondary | ICD-10-CM

## 2018-12-13 DIAGNOSIS — E11311 Type 2 diabetes mellitus with unspecified diabetic retinopathy with macular edema: Secondary | ICD-10-CM | POA: Diagnosis not present

## 2019-01-08 ENCOUNTER — Other Ambulatory Visit: Payer: Self-pay | Admitting: Nurse Practitioner

## 2019-01-08 DIAGNOSIS — Z794 Long term (current) use of insulin: Secondary | ICD-10-CM

## 2019-01-08 DIAGNOSIS — E1165 Type 2 diabetes mellitus with hyperglycemia: Secondary | ICD-10-CM

## 2019-01-08 NOTE — Telephone Encounter (Signed)
MMM NTBS Last A1C 01/18/18 LOV 07/24/18

## 2019-01-08 NOTE — Telephone Encounter (Signed)
Left detailed message needs to make an appt

## 2019-01-10 ENCOUNTER — Encounter (INDEPENDENT_AMBULATORY_CARE_PROVIDER_SITE_OTHER): Payer: Medicare Other | Admitting: Ophthalmology

## 2019-01-10 DIAGNOSIS — H35033 Hypertensive retinopathy, bilateral: Secondary | ICD-10-CM | POA: Diagnosis not present

## 2019-01-10 DIAGNOSIS — D3132 Benign neoplasm of left choroid: Secondary | ICD-10-CM

## 2019-01-10 DIAGNOSIS — I10 Essential (primary) hypertension: Secondary | ICD-10-CM | POA: Diagnosis not present

## 2019-01-10 DIAGNOSIS — E11311 Type 2 diabetes mellitus with unspecified diabetic retinopathy with macular edema: Secondary | ICD-10-CM

## 2019-01-10 DIAGNOSIS — E113513 Type 2 diabetes mellitus with proliferative diabetic retinopathy with macular edema, bilateral: Secondary | ICD-10-CM

## 2019-01-10 DIAGNOSIS — H2513 Age-related nuclear cataract, bilateral: Secondary | ICD-10-CM

## 2019-01-10 DIAGNOSIS — H43813 Vitreous degeneration, bilateral: Secondary | ICD-10-CM

## 2019-01-22 ENCOUNTER — Other Ambulatory Visit: Payer: Self-pay | Admitting: Nurse Practitioner

## 2019-01-22 DIAGNOSIS — E1165 Type 2 diabetes mellitus with hyperglycemia: Secondary | ICD-10-CM

## 2019-01-24 ENCOUNTER — Telehealth: Payer: Self-pay | Admitting: Nurse Practitioner

## 2019-01-24 DIAGNOSIS — E1165 Type 2 diabetes mellitus with hyperglycemia: Secondary | ICD-10-CM

## 2019-01-24 DIAGNOSIS — Z794 Long term (current) use of insulin: Secondary | ICD-10-CM

## 2019-01-24 MED ORDER — TRESIBA FLEXTOUCH 200 UNIT/ML ~~LOC~~ SOPN: 150.0000 [IU] | PEN_INJECTOR | Freq: Every day | SUBCUTANEOUS | 0 refills | Status: DC

## 2019-01-24 MED ORDER — INSULIN LISPRO (1 UNIT DIAL) 100 UNIT/ML (KWIKPEN): 40.0000 [IU] | PEN_INJECTOR | Freq: Three times a day (TID) | SUBCUTANEOUS | 0 refills | Status: DC

## 2019-01-24 NOTE — Telephone Encounter (Signed)
rx corrected to humalog 15pens and tresiba 24 pens

## 2019-01-27 ENCOUNTER — Telehealth: Payer: Self-pay | Admitting: Nurse Practitioner

## 2019-01-27 DIAGNOSIS — E1165 Type 2 diabetes mellitus with hyperglycemia: Secondary | ICD-10-CM

## 2019-01-27 DIAGNOSIS — IMO0002 Reserved for concepts with insufficient information to code with codable children: Secondary | ICD-10-CM

## 2019-01-27 DIAGNOSIS — E1136 Type 2 diabetes mellitus with diabetic cataract: Secondary | ICD-10-CM

## 2019-01-27 DIAGNOSIS — E785 Hyperlipidemia, unspecified: Secondary | ICD-10-CM

## 2019-01-27 DIAGNOSIS — I10 Essential (primary) hypertension: Secondary | ICD-10-CM

## 2019-01-27 NOTE — Telephone Encounter (Signed)
Patient would like to speak to [facility] directly.

## 2019-01-28 MED ORDER — METOPROLOL SUCCINATE ER 100 MG PO TB24: 100.0000 mg | ORAL_TABLET | Freq: Every day | ORAL | 1 refills | Status: DC

## 2019-01-28 MED ORDER — ONETOUCH ULTRA VI STRP: ORAL_STRIP | 3 refills | Status: DC

## 2019-01-28 MED ORDER — NOVOFINE 32G X 6 MM MISC: 3 refills | Status: DC

## 2019-01-28 MED ORDER — ONETOUCH ULTRASOFT LANCETS MISC: 3 refills | Status: DC

## 2019-01-28 MED ORDER — LISINOPRIL 20 MG PO TABS: 20.0000 mg | ORAL_TABLET | Freq: Two times a day (BID) | ORAL | 1 refills | Status: DC

## 2019-01-28 MED ORDER — ATORVASTATIN CALCIUM 80 MG PO TABS: 80.0000 mg | ORAL_TABLET | Freq: Every day | ORAL | 1 refills | Status: DC

## 2019-01-28 MED ORDER — INSULIN LISPRO (1 UNIT DIAL) 100 UNIT/ML (KWIKPEN): 40.0000 [IU] | PEN_INJECTOR | Freq: Three times a day (TID) | SUBCUTANEOUS | 0 refills | Status: DC

## 2019-01-28 MED ORDER — JANUMET XR 50-1000 MG PO TB24: 1.0000 | ORAL_TABLET | Freq: Every day | ORAL | 1 refills | Status: DC

## 2019-01-28 MED ORDER — ASPIRIN 81 MG PO CHEW: CHEWABLE_TABLET | ORAL | 1 refills | Status: DC

## 2019-01-28 MED ORDER — TRESIBA FLEXTOUCH 200 UNIT/ML ~~LOC~~ SOPN: 150.0000 [IU] | PEN_INJECTOR | Freq: Every day | SUBCUTANEOUS | 0 refills | Status: AC

## 2019-01-28 NOTE — Telephone Encounter (Signed)
Verified dosing for insulin with patient and pharmacy. Sent in 3 month supply for patient. Patient verbalized understanding

## 2019-02-07 ENCOUNTER — Encounter (INDEPENDENT_AMBULATORY_CARE_PROVIDER_SITE_OTHER): Payer: Medicare Other | Admitting: Ophthalmology

## 2019-02-07 DIAGNOSIS — E113513 Type 2 diabetes mellitus with proliferative diabetic retinopathy with macular edema, bilateral: Secondary | ICD-10-CM | POA: Diagnosis not present

## 2019-02-07 DIAGNOSIS — H2513 Age-related nuclear cataract, bilateral: Secondary | ICD-10-CM

## 2019-02-07 DIAGNOSIS — H43813 Vitreous degeneration, bilateral: Secondary | ICD-10-CM

## 2019-02-07 DIAGNOSIS — H35033 Hypertensive retinopathy, bilateral: Secondary | ICD-10-CM

## 2019-02-07 DIAGNOSIS — E11311 Type 2 diabetes mellitus with unspecified diabetic retinopathy with macular edema: Secondary | ICD-10-CM

## 2019-02-07 DIAGNOSIS — D3132 Benign neoplasm of left choroid: Secondary | ICD-10-CM

## 2019-02-07 DIAGNOSIS — I10 Essential (primary) hypertension: Secondary | ICD-10-CM

## 2019-03-14 ENCOUNTER — Encounter (INDEPENDENT_AMBULATORY_CARE_PROVIDER_SITE_OTHER): Payer: Medicare Other | Admitting: Ophthalmology

## 2019-03-14 ENCOUNTER — Encounter (INDEPENDENT_AMBULATORY_CARE_PROVIDER_SITE_OTHER): Payer: Medicare PPO | Admitting: Ophthalmology

## 2019-03-14 ENCOUNTER — Other Ambulatory Visit: Payer: Self-pay

## 2019-03-14 DIAGNOSIS — E113513 Type 2 diabetes mellitus with proliferative diabetic retinopathy with macular edema, bilateral: Secondary | ICD-10-CM | POA: Diagnosis not present

## 2019-03-14 DIAGNOSIS — H43813 Vitreous degeneration, bilateral: Secondary | ICD-10-CM | POA: Diagnosis not present

## 2019-03-14 DIAGNOSIS — D3132 Benign neoplasm of left choroid: Secondary | ICD-10-CM | POA: Diagnosis not present

## 2019-03-14 DIAGNOSIS — I10 Essential (primary) hypertension: Secondary | ICD-10-CM

## 2019-03-14 DIAGNOSIS — H35033 Hypertensive retinopathy, bilateral: Secondary | ICD-10-CM | POA: Diagnosis not present

## 2019-03-14 DIAGNOSIS — E11311 Type 2 diabetes mellitus with unspecified diabetic retinopathy with macular edema: Secondary | ICD-10-CM

## 2019-03-14 DIAGNOSIS — H2513 Age-related nuclear cataract, bilateral: Secondary | ICD-10-CM

## 2019-03-14 LAB — HM DIABETES EYE EXAM

## 2019-03-27 ENCOUNTER — Other Ambulatory Visit: Payer: Self-pay | Admitting: Nurse Practitioner

## 2019-03-27 DIAGNOSIS — R609 Edema, unspecified: Secondary | ICD-10-CM

## 2019-03-27 NOTE — Telephone Encounter (Signed)
Lmtcb to schedule appt-cb 1/21

## 2019-03-27 NOTE — Telephone Encounter (Signed)
ntbs Last OV 07/2018 Last labs 2019

## 2019-04-02 ENCOUNTER — Telehealth: Payer: Self-pay | Admitting: Nurse Practitioner

## 2019-04-02 MED ORDER — BLOOD GLUCOSE METER KIT: PACK | 0 refills | Status: DC

## 2019-04-02 NOTE — Telephone Encounter (Signed)
Please order please

## 2019-04-02 NOTE — Telephone Encounter (Signed)
Spoke with Anderson Malta at [facility] in [facility] who said that she sent [place] an Rx request that stated that pt has switched insurances and new insurance doesn't cover the One Touch. Says pt needs [employer] Guide meter, strips, and lancets.

## 2019-04-02 NOTE — Telephone Encounter (Signed)
Faxed rx to [facility] in [facility] per patients request

## 2019-04-11 ENCOUNTER — Other Ambulatory Visit: Payer: Self-pay

## 2019-04-11 ENCOUNTER — Encounter (INDEPENDENT_AMBULATORY_CARE_PROVIDER_SITE_OTHER): Payer: Medicare PPO | Admitting: Ophthalmology

## 2019-04-11 DIAGNOSIS — D3132 Benign neoplasm of left choroid: Secondary | ICD-10-CM

## 2019-04-11 DIAGNOSIS — H35033 Hypertensive retinopathy, bilateral: Secondary | ICD-10-CM | POA: Diagnosis not present

## 2019-04-11 DIAGNOSIS — E113593 Type 2 diabetes mellitus with proliferative diabetic retinopathy without macular edema, bilateral: Secondary | ICD-10-CM | POA: Diagnosis not present

## 2019-04-11 DIAGNOSIS — E11311 Type 2 diabetes mellitus with unspecified diabetic retinopathy with macular edema: Secondary | ICD-10-CM

## 2019-04-11 DIAGNOSIS — I10 Essential (primary) hypertension: Secondary | ICD-10-CM

## 2019-04-11 DIAGNOSIS — H43813 Vitreous degeneration, bilateral: Secondary | ICD-10-CM | POA: Diagnosis not present

## 2019-04-11 DIAGNOSIS — H2513 Age-related nuclear cataract, bilateral: Secondary | ICD-10-CM

## 2019-05-01 ENCOUNTER — Other Ambulatory Visit: Payer: Self-pay | Admitting: Nurse Practitioner

## 2019-05-01 DIAGNOSIS — E1136 Type 2 diabetes mellitus with diabetic cataract: Secondary | ICD-10-CM

## 2019-05-01 DIAGNOSIS — IMO0002 Reserved for concepts with insufficient information to code with codable children: Secondary | ICD-10-CM

## 2019-05-01 DIAGNOSIS — E1165 Type 2 diabetes mellitus with hyperglycemia: Secondary | ICD-10-CM

## 2019-05-11 ENCOUNTER — Other Ambulatory Visit: Payer: Self-pay | Admitting: Nurse Practitioner

## 2019-05-12 ENCOUNTER — Encounter (INDEPENDENT_AMBULATORY_CARE_PROVIDER_SITE_OTHER): Payer: Medicare PPO | Admitting: Ophthalmology

## 2019-05-12 DIAGNOSIS — H43813 Vitreous degeneration, bilateral: Secondary | ICD-10-CM | POA: Diagnosis not present

## 2019-05-12 DIAGNOSIS — E113513 Type 2 diabetes mellitus with proliferative diabetic retinopathy with macular edema, bilateral: Secondary | ICD-10-CM | POA: Diagnosis not present

## 2019-05-12 DIAGNOSIS — E11311 Type 2 diabetes mellitus with unspecified diabetic retinopathy with macular edema: Secondary | ICD-10-CM | POA: Diagnosis not present

## 2019-05-12 DIAGNOSIS — D3132 Benign neoplasm of left choroid: Secondary | ICD-10-CM

## 2019-05-12 DIAGNOSIS — I10 Essential (primary) hypertension: Secondary | ICD-10-CM

## 2019-05-12 DIAGNOSIS — H2513 Age-related nuclear cataract, bilateral: Secondary | ICD-10-CM | POA: Diagnosis not present

## 2019-05-12 DIAGNOSIS — H35033 Hypertensive retinopathy, bilateral: Secondary | ICD-10-CM | POA: Diagnosis not present

## 2019-05-19 DIAGNOSIS — E113513 Type 2 diabetes mellitus with proliferative diabetic retinopathy with macular edema, bilateral: Secondary | ICD-10-CM | POA: Diagnosis not present

## 2019-05-19 DIAGNOSIS — H25013 Cortical age-related cataract, bilateral: Secondary | ICD-10-CM | POA: Diagnosis not present

## 2019-05-19 DIAGNOSIS — H25043 Posterior subcapsular polar age-related cataract, bilateral: Secondary | ICD-10-CM | POA: Diagnosis not present

## 2019-05-19 DIAGNOSIS — H2512 Age-related nuclear cataract, left eye: Secondary | ICD-10-CM | POA: Diagnosis not present

## 2019-05-19 DIAGNOSIS — H2513 Age-related nuclear cataract, bilateral: Secondary | ICD-10-CM | POA: Diagnosis not present

## 2019-05-30 ENCOUNTER — Other Ambulatory Visit: Payer: Self-pay

## 2019-05-30 ENCOUNTER — Encounter: Payer: Self-pay | Admitting: Nurse Practitioner

## 2019-05-30 ENCOUNTER — Ambulatory Visit: Payer: Medicare PPO | Admitting: Nurse Practitioner

## 2019-05-30 VITALS — BP 139/67 | HR 59 | Temp 97.3°F | Resp 20 | Ht 68.0 in | Wt 251.0 lb

## 2019-05-30 DIAGNOSIS — K219 Gastro-esophageal reflux disease without esophagitis: Secondary | ICD-10-CM | POA: Diagnosis not present

## 2019-05-30 DIAGNOSIS — Z794 Long term (current) use of insulin: Secondary | ICD-10-CM

## 2019-05-30 DIAGNOSIS — I10 Essential (primary) hypertension: Secondary | ICD-10-CM

## 2019-05-30 DIAGNOSIS — E1165 Type 2 diabetes mellitus with hyperglycemia: Secondary | ICD-10-CM | POA: Diagnosis not present

## 2019-05-30 DIAGNOSIS — R609 Edema, unspecified: Secondary | ICD-10-CM | POA: Diagnosis not present

## 2019-05-30 DIAGNOSIS — E785 Hyperlipidemia, unspecified: Secondary | ICD-10-CM | POA: Diagnosis not present

## 2019-05-30 LAB — BAYER DCA HB A1C WAIVED: HB A1C (BAYER DCA - WAIVED): 7 % — ABNORMAL HIGH (ref <7.0)

## 2019-05-30 MED ORDER — INSULIN DEGLUDEC 200 UNIT/ML ~~LOC~~ SOPN: 52.0000 [IU] | PEN_INJECTOR | Freq: Every day | SUBCUTANEOUS | 1 refills | Status: DC

## 2019-05-30 MED ORDER — LISINOPRIL 20 MG PO TABS: 20.0000 mg | ORAL_TABLET | Freq: Two times a day (BID) | ORAL | 1 refills | Status: DC

## 2019-05-30 MED ORDER — METOPROLOL SUCCINATE ER 100 MG PO TB24: 100.0000 mg | ORAL_TABLET | Freq: Every day | ORAL | 1 refills | Status: DC

## 2019-05-30 MED ORDER — ATORVASTATIN CALCIUM 80 MG PO TABS: 80.0000 mg | ORAL_TABLET | Freq: Every day | ORAL | 1 refills | Status: DC

## 2019-05-30 MED ORDER — FUROSEMIDE 40 MG PO TABS: 40.0000 mg | ORAL_TABLET | Freq: Every day | ORAL | 1 refills | Status: DC

## 2019-05-30 MED ORDER — JANUMET XR 50-1000 MG PO TB24: 1.0000 | ORAL_TABLET | Freq: Every day | ORAL | 1 refills | Status: DC

## 2019-05-30 NOTE — Progress Notes (Signed)
Subjective:    Patient ID: Emma Griffin, female    DOB: 09-Jan-1951, 69 y.o.   MRN: [id number]   Chief Complaint: Medical Management of Chronic Issues    HPI:  1. Hyperlipidemia, unspecified hyperlipidemia type Has been trying to watch diet, but not doing much exercise  2. Type 2 diabetes mellitus with hyperglycemia, with long-term current use of insulin (HCC) Fasting blood sugars have been running from 110-160 the last 2 months. She is no longer needing meal time insulin. Has just been doing tresiba 52u alomg with janumet. She s living with her sister now and she makes her watch diet. Lab Results  Component Value Date   HGBA1C 8.8 (H) 01/18/2018     3. Essential hypertension No c/o chest pain, sob or headache. Does not check blood pressure at home. BP Readings from Last 3 Encounters:  05/30/19 139/67  01/18/18 (!) 144/81  10/08/17 133/72     4. Gastroesophageal reflux disease without esophagitis Has been doing better. Has very few symptoms  5. Peripheral edema No edema  6. Morbid obesity ([place]) Weight is down 30lbs.  Wt Readings from Last 3 Encounters:  05/30/19 251 lb (113.9 kg)  01/18/18 273 lb (123.8 kg)  10/08/17 282 lb (127.9 kg)   BMI Readings from Last 3 Encounters:  05/30/19 38.16 kg/m  01/18/18 41.51 kg/m  10/08/17 42.88 kg/m       Outpatient Encounter Medications as of 05/30/2019  Medication Sig  . Ascorbic Acid (VITAMIN C) 1000 MG tablet Take 1,000 mg by mouth daily.  Marland Kitchen aspirin ([facility] ASPIRIN ADULT LOW DOSE) 81 MG chewable tablet CHEW 1 TABLET (81 MG TOTAL) BY MOUTH DAILY.  Marland Kitchen atorvastatin (LIPITOR) 80 MG tablet Take 1 tablet (80 mg total) by mouth daily at 6 PM.  . bevacizumab (AVASTIN) 1.25 mg/0.1 mL SOLN Apply 1.25 mg to eye See admin instructions. Opthalmic injection every 4-6 weeks  . Blood Glucose Monitoring Suppl (ACCU-CHEK GUIDE ME) w/Device KIT 1 each by Other route 4 (four) times daily. Dx E11.9  . furosemide (LASIX) 40 MG tablet Take 1  tablet (40 mg total) by mouth daily.  Marland Kitchen gatifloxacin (ZYMAR) 0.3 % ophthalmic drops Place 1 drop into the right eye 4 (four) times daily. Used for 2 days after surgery  . glucose blood (ACCU-CHEK GUIDE) test strip Test up to 4 times daily Dx E11.65  . ibuprofen (ADVIL,MOTRIN) 200 MG tablet Take 400-600 mg by mouth every 6 (six) hours as needed. For pain/cramps  . insulin degludec (TRESIBA) 200 UNIT/ML FlexTouch Pen Inject 52 Units into the skin.   . Insulin Pen Needle (NOVOFINE) 32G X 6 MM MISC Use with insulin pen 4 times daily Dx E11.6  . Lancets (ONETOUCH ULTRASOFT) lancets TEST FOUR TIMES DAILY  . lisinopril (ZESTRIL) 20 MG tablet Take 1 tablet (20 mg total) by mouth 2 (two) times daily. (Patient taking differently: Take 10 mg by mouth 2 (two) times daily. )  . metoprolol succinate (TOPROL-XL) 100 MG 24 hr tablet Take 1 tablet (100 mg total) by mouth daily.  . Multiple Vitamins-Minerals (AIRBORNE) CHEW Chew 1 tablet by mouth as needed.   . SitaGLIPtin-MetFORMIN HCl (JANUMET XR) 50-1000 MG TB24 Take 1 tablet by mouth daily. with food  . Calcium Carbonate-Vitamin D (CVS CALCIUM/VIT D SOFT CHEWS PO) Take 1 tablet by mouth daily.   . insulin lispro (HUMALOG KWIKPEN) 100 UNIT/ML KwikPen Inject 0.4 mLs (40 Units total) into the skin 3 (three) times daily. (Needs to be seen before next  refill) (Patient not taking: Reported on 05/30/2019)  . nitroGLYCERIN (NITROSTAT) 0.4 MG SL tablet Place under the tongue.   No facility-administered encounter medications on file as of 05/30/2019.    Past Surgical History:  Procedure Laterality Date  . [place]   x 1  . EYE SURGERY     weak muscle eye surgery as child  . HYSTEROSCOPY WITH D & C  09/20/2011   Procedure: DILATATION AND CURETTAGE /HYSTEROSCOPY;  Surgeon: Allyn Kenner, DO;  Location: [facility];  Service: Gynecology;  Laterality: N/A;  colposcopy  . LITHOTRIPSY     kidney stone  . REFRACTIVE SURGERY     broken blood vessels behind eyes  - bilaterally  . right side wisdom teeth ext     still has left wisdom teeth top/botom  . TOOTH EXTRACTION     upper and lower back right    Family History  Problem Relation Age of Onset  . Diabetes Mother   . Hypertension Mother   . Atrial fibrillation Mother   . Heart attack Father   . Diabetes Sister     New complaints: None today  Social history: Her sisters husband passed away, so she has moved in with her for now.  Controlled substance contract: n/a    Review of Systems  Constitutional: Negative for diaphoresis.  Eyes: Positive for visual disturbance. Negative for pain.  Respiratory: Negative for shortness of breath.   Cardiovascular: Negative for chest pain, palpitations and leg swelling.  Gastrointestinal: Negative for abdominal pain.  Endocrine: Negative for polydipsia.  Skin: Negative for rash.  Neurological: Negative for dizziness, weakness and headaches.  Hematological: Does not bruise/bleed easily.  All other systems reviewed and are negative.      Objective:   Physical Exam Vitals and nursing note reviewed.  Constitutional:      General: She is not in acute distress.    Appearance: Normal appearance. She is well-developed.  HENT:     Head: Normocephalic.     Nose: Nose normal.  Eyes:     Pupils: Pupils are equal, round, and reactive to light.  Neck:     Vascular: No carotid bruit or JVD.  Cardiovascular:     Rate and Rhythm: Normal rate and regular rhythm.     Heart sounds: Normal heart sounds.  Pulmonary:     Effort: Pulmonary effort is normal. No respiratory distress.     Breath sounds: Normal breath sounds. No wheezing or rales.  Chest:     Chest wall: No tenderness.  Abdominal:     General: Bowel sounds are normal. There is no distension or abdominal bruit.     Palpations: Abdomen is soft. There is no hepatomegaly, splenomegaly, mass or pulsatile mass.     Tenderness: There is no abdominal tenderness.  Musculoskeletal:         General: Normal range of motion.     Cervical back: Normal range of motion and neck supple.  Lymphadenopathy:     Cervical: No cervical adenopathy.  Skin:    General: Skin is warm and dry.  Neurological:     Mental Status: She is alert and oriented to person, place, and time.     Deep Tendon Reflexes: Reflexes are normal and symmetric.  Psychiatric:        Behavior: Behavior normal.        Thought Content: Thought content normal.        Judgment: Judgment normal.    BP 139/67  Pulse (!) 59   Temp (!) 97.3 F (36.3 C) (Temporal)   Resp 20   Ht '5\' 8"'  (1.727 m)   Wt 251 lb (113.9 kg)   SpO2 99%   BMI 38.16 kg/m   HGBA1c 7.0       Assessment & Plan:  Emma Griffin comes in today with chief complaint of Medical Management of Chronic Issues   Diagnosis and orders addressed:  1. Hyperlipidemia, unspecified hyperlipidemia type Low fat diet - Lipid panel - atorvastatin (LIPITOR) 80 MG tablet; Take 1 tablet (80 mg total) by mouth daily at 6 PM.  Dispense: 90 tablet; Refill: 1  2. Type 2 diabetes mellitus with hyperglycemia, with long-term current use of insulin (HCC) Continue to watch carbsin diet - Bayer DCA Hb A1c Waived - Microalbumin / creatinine urine ratio - SitaGLIPtin-MetFORMIN HCl (JANUMET XR) 50-1000 MG TB24; Take 1 tablet by mouth daily. with food  Dispense: 90 tablet; Refill: 1 - insulin degludec (TRESIBA) 200 UNIT/ML FlexTouch Pen; Inject 52 Units into the skin daily.  Dispense: 24 mL; Refill: 1  3. Essential hypertension Low sodium diet - CBC with Differential/Platelet - CMP14+EGFR - metoprolol succinate (TOPROL-XL) 100 MG 24 hr tablet; Take 1 tablet (100 mg total) by mouth daily.  Dispense: 90 tablet; Refill: 1 - lisinopril (ZESTRIL) 20 MG tablet; Take 1 tablet (20 mg total) by mouth 2 (two) times daily.  Dispense: 180 tablet; Refill: 1  4. Gastroesophageal reflux disease without esophagitis Avoid spicy foods Do not eat 2 hours prior to bedtime  5.  Peripheral edema Elevate legs when siting - furosemide (LASIX) 40 MG tablet; Take 1 tablet (40 mg total) by mouth daily.  Dispense: 90 tablet; Refill: 1  6. Morbid obesity ([place]) Discussed diet and exercise for person with BMI >25 Will recheck weight in 3-6 months    Labs pending Health Maintenance reviewed Diet and exercise encouraged  Follow up plan: 3 months   Mary-Margaret Hassell Done, [name]

## 2019-05-30 NOTE — Patient Instructions (Signed)
Diabetes Mellitus and Foot Care Foot care is an important part of your health, especially when you have diabetes. Diabetes may cause you to have problems because of poor blood flow (circulation) to your feet and legs, which can cause your skin to:  Become thinner and drier.  Break more easily.  Heal more slowly.  Peel and crack. You may also have nerve damage (neuropathy) in your legs and feet, causing decreased feeling in them. This means that you may not notice minor injuries to your feet that could lead to more serious problems. Noticing and addressing any potential problems early is the best way to prevent future foot problems. How to care for your feet Foot hygiene  Wash your feet daily with warm water and mild soap. Do not use hot water. Then, pat your feet and the areas between your toes until they are completely dry. Do not soak your feet as this can dry your skin.  Trim your toenails straight across. Do not dig under them or around the cuticle. File the edges of your nails with an emery board or nail file.  Apply a moisturizing lotion or petroleum jelly to the skin on your feet and to dry, brittle toenails. Use lotion that does not contain alcohol and is unscented. Do not apply lotion between your toes. Shoes and socks  Wear clean socks or stockings every day. Make sure they are not too tight. Do not wear knee-high stockings since they may decrease blood flow to your legs.  Wear shoes that fit properly and have enough cushioning. Always look in your shoes before you put them on to be sure there are no objects inside.  To break in new shoes, wear them for just a few hours a day. This prevents injuries on your feet. Wounds, scrapes, corns, and calluses  Check your feet daily for blisters, cuts, bruises, sores, and redness. If you cannot see the bottom of your feet, use a mirror or ask someone for help.  Do not cut corns or calluses or try to remove them with medicine.  If you  find a minor scrape, cut, or break in the skin on your feet, keep it and the skin around it clean and dry. You may clean these areas with mild soap and water. Do not clean the area with peroxide, alcohol, or iodine.  If you have a wound, scrape, corn, or callus on your foot, look at it several times a day to make sure it is healing and not infected. Check for: ? Redness, swelling, or pain. ? Fluid or blood. ? Warmth. ? Pus or a bad smell. General instructions  Do not cross your legs. This may decrease blood flow to your feet.  Do not use heating pads or hot water bottles on your feet. They may burn your skin. If you have lost feeling in your feet or legs, you may not know this is happening until it is too late.  Protect your feet from hot and cold by wearing shoes, such as at the beach or on hot pavement.  Schedule a complete foot exam at least once a year (annually) or more often if you have foot problems. If you have foot problems, report any cuts, sores, or bruises to your health care provider immediately. Contact a health care provider if:  You have a medical condition that increases your risk of infection and you have any cuts, sores, or bruises on your feet.  You have an injury that is not   healing.  You have redness on your legs or feet.  You feel burning or tingling in your legs or feet.  You have pain or cramps in your legs and feet.  Your legs or feet are numb.  Your feet always feel cold.  You have pain around a toenail. Get help right away if:  You have a wound, scrape, corn, or callus on your foot and: ? You have pain, swelling, or redness that gets worse. ? You have fluid or blood coming from the wound, scrape, corn, or callus. ? Your wound, scrape, corn, or callus feels warm to the touch. ? You have pus or a bad smell coming from the wound, scrape, corn, or callus. ? You have a fever. ? You have a red line going up your leg. Summary  Check your feet every day  for cuts, sores, red spots, swelling, and blisters.  Moisturize feet and legs daily.  Wear shoes that fit properly and have enough cushioning.  If you have foot problems, report any cuts, sores, or bruises to your health care provider immediately.  Schedule a complete foot exam at least once a year (annually) or more often if you have foot problems. This information is not intended to replace advice given to you by your health care provider. Make sure you discuss any questions you have with your health care provider. Document Revised: 11/13/2018 Document Reviewed: 03/24/2016 Elsevier Patient Education  2020 Elsevier Inc.  

## 2019-05-31 LAB — CBC WITH DIFFERENTIAL/PLATELET
Basophils Absolute: 0 10*3/uL (ref 0.0–0.2)
Basos: 0 %
EOS (ABSOLUTE): 0.3 10*3/uL (ref 0.0–0.4)
Eos: 3 %
Hematocrit: 42.4 % (ref 34.0–46.6)
Hemoglobin: 13.5 g/dL (ref 11.1–15.9)
Immature Grans (Abs): 0 10*3/uL (ref 0.0–0.1)
Immature Granulocytes: 0 %
Lymphocytes Absolute: 2.6 10*3/uL (ref 0.7–3.1)
Lymphs: 27 %
MCH: 26 pg — ABNORMAL LOW (ref 26.6–33.0)
MCHC: 31.8 g/dL (ref 31.5–35.7)
MCV: 82 fL (ref 79–97)
Monocytes Absolute: 0.8 10*3/uL (ref 0.1–0.9)
Monocytes: 8 %
Neutrophils Absolute: 5.9 10*3/uL (ref 1.4–7.0)
Neutrophils: 62 %
Platelets: 221 10*3/uL (ref 150–450)
RBC: 5.19 x10E6/uL (ref 3.77–5.28)
RDW: 14.1 % (ref 11.7–15.4)
WBC: 9.6 10*3/uL (ref 3.4–10.8)

## 2019-05-31 LAB — LIPID PANEL
Chol/HDL Ratio: 3.6 ratio (ref 0.0–4.4)
Cholesterol, Total: 124 mg/dL (ref 100–199)
HDL: 34 mg/dL — ABNORMAL LOW (ref >39)
LDL Chol Calc (NIH): 63 mg/dL (ref 0–99)
Triglycerides: 157 mg/dL — ABNORMAL HIGH (ref 0–149)
VLDL Cholesterol Cal: 27 mg/dL (ref 5–40)

## 2019-05-31 LAB — CMP14+EGFR
ALT: 19 IU/L (ref 0–32)
AST: 20 IU/L (ref 0–40)
Albumin/Globulin Ratio: 1.1 — ABNORMAL LOW (ref 1.2–2.2)
Albumin: 3.9 g/dL (ref 3.8–4.8)
Alkaline Phosphatase: 90 IU/L (ref 39–117)
BUN/Creatinine Ratio: 16 (ref 12–28)
BUN: 21 mg/dL (ref 8–27)
Bilirubin Total: 0.4 mg/dL (ref 0.0–1.2)
CO2: 26 mmol/L (ref 20–29)
Calcium: 9.4 mg/dL (ref 8.7–10.3)
Chloride: 102 mmol/L (ref 96–106)
Creatinine, Ser: 1.31 mg/dL — ABNORMAL HIGH (ref 0.57–1.00)
GFR calc Af Amer: 48 mL/min/{1.73_m2} — ABNORMAL LOW (ref >59)
GFR calc non Af Amer: 42 mL/min/{1.73_m2} — ABNORMAL LOW (ref >59)
Globulin, Total: 3.4 g/dL (ref 1.5–4.5)
Glucose: 122 mg/dL — ABNORMAL HIGH (ref 65–99)
Potassium: 4.6 mmol/L (ref 3.5–5.2)
Sodium: 141 mmol/L (ref 134–144)
Total Protein: 7.3 g/dL (ref 6.0–8.5)

## 2019-06-09 ENCOUNTER — Encounter (INDEPENDENT_AMBULATORY_CARE_PROVIDER_SITE_OTHER): Payer: Medicare PPO | Admitting: Ophthalmology

## 2019-06-09 DIAGNOSIS — E113513 Type 2 diabetes mellitus with proliferative diabetic retinopathy with macular edema, bilateral: Secondary | ICD-10-CM

## 2019-06-09 DIAGNOSIS — E11311 Type 2 diabetes mellitus with unspecified diabetic retinopathy with macular edema: Secondary | ICD-10-CM

## 2019-06-09 DIAGNOSIS — H35033 Hypertensive retinopathy, bilateral: Secondary | ICD-10-CM

## 2019-06-09 DIAGNOSIS — H2513 Age-related nuclear cataract, bilateral: Secondary | ICD-10-CM

## 2019-06-09 DIAGNOSIS — D3132 Benign neoplasm of left choroid: Secondary | ICD-10-CM | POA: Diagnosis not present

## 2019-06-09 DIAGNOSIS — H43813 Vitreous degeneration, bilateral: Secondary | ICD-10-CM

## 2019-06-09 DIAGNOSIS — I10 Essential (primary) hypertension: Secondary | ICD-10-CM

## 2019-06-11 DIAGNOSIS — H2512 Age-related nuclear cataract, left eye: Secondary | ICD-10-CM | POA: Diagnosis not present

## 2019-06-11 DIAGNOSIS — H25812 Combined forms of age-related cataract, left eye: Secondary | ICD-10-CM | POA: Diagnosis not present

## 2019-07-07 ENCOUNTER — Encounter (INDEPENDENT_AMBULATORY_CARE_PROVIDER_SITE_OTHER): Payer: Medicare PPO | Admitting: Ophthalmology

## 2019-07-07 DIAGNOSIS — H35033 Hypertensive retinopathy, bilateral: Secondary | ICD-10-CM

## 2019-07-07 DIAGNOSIS — I10 Essential (primary) hypertension: Secondary | ICD-10-CM | POA: Diagnosis not present

## 2019-07-07 DIAGNOSIS — E11311 Type 2 diabetes mellitus with unspecified diabetic retinopathy with macular edema: Secondary | ICD-10-CM | POA: Diagnosis not present

## 2019-07-07 DIAGNOSIS — H43813 Vitreous degeneration, bilateral: Secondary | ICD-10-CM

## 2019-07-07 DIAGNOSIS — E113513 Type 2 diabetes mellitus with proliferative diabetic retinopathy with macular edema, bilateral: Secondary | ICD-10-CM | POA: Diagnosis not present

## 2019-07-15 DIAGNOSIS — H2511 Age-related nuclear cataract, right eye: Secondary | ICD-10-CM | POA: Diagnosis not present

## 2019-07-15 DIAGNOSIS — H25041 Posterior subcapsular polar age-related cataract, right eye: Secondary | ICD-10-CM | POA: Diagnosis not present

## 2019-07-15 DIAGNOSIS — H25011 Cortical age-related cataract, right eye: Secondary | ICD-10-CM | POA: Diagnosis not present

## 2019-08-06 ENCOUNTER — Encounter (INDEPENDENT_AMBULATORY_CARE_PROVIDER_SITE_OTHER): Payer: Medicare PPO | Admitting: Ophthalmology

## 2019-08-06 ENCOUNTER — Other Ambulatory Visit: Payer: Self-pay

## 2019-08-06 DIAGNOSIS — H35033 Hypertensive retinopathy, bilateral: Secondary | ICD-10-CM

## 2019-08-06 DIAGNOSIS — E113513 Type 2 diabetes mellitus with proliferative diabetic retinopathy with macular edema, bilateral: Secondary | ICD-10-CM

## 2019-08-06 DIAGNOSIS — D3132 Benign neoplasm of left choroid: Secondary | ICD-10-CM | POA: Diagnosis not present

## 2019-08-06 DIAGNOSIS — E11311 Type 2 diabetes mellitus with unspecified diabetic retinopathy with macular edema: Secondary | ICD-10-CM | POA: Diagnosis not present

## 2019-08-06 DIAGNOSIS — H43813 Vitreous degeneration, bilateral: Secondary | ICD-10-CM | POA: Diagnosis not present

## 2019-08-06 DIAGNOSIS — I10 Essential (primary) hypertension: Secondary | ICD-10-CM | POA: Diagnosis not present

## 2019-09-03 ENCOUNTER — Other Ambulatory Visit: Payer: Self-pay

## 2019-09-03 ENCOUNTER — Encounter (INDEPENDENT_AMBULATORY_CARE_PROVIDER_SITE_OTHER): Payer: Medicare PPO | Admitting: Ophthalmology

## 2019-09-03 DIAGNOSIS — I10 Essential (primary) hypertension: Secondary | ICD-10-CM | POA: Diagnosis not present

## 2019-09-03 DIAGNOSIS — E113513 Type 2 diabetes mellitus with proliferative diabetic retinopathy with macular edema, bilateral: Secondary | ICD-10-CM | POA: Diagnosis not present

## 2019-09-03 DIAGNOSIS — E11311 Type 2 diabetes mellitus with unspecified diabetic retinopathy with macular edema: Secondary | ICD-10-CM

## 2019-09-03 DIAGNOSIS — D3132 Benign neoplasm of left choroid: Secondary | ICD-10-CM

## 2019-09-03 DIAGNOSIS — H35033 Hypertensive retinopathy, bilateral: Secondary | ICD-10-CM | POA: Diagnosis not present

## 2019-09-03 DIAGNOSIS — H2511 Age-related nuclear cataract, right eye: Secondary | ICD-10-CM

## 2019-09-03 DIAGNOSIS — H43813 Vitreous degeneration, bilateral: Secondary | ICD-10-CM

## 2019-09-04 ENCOUNTER — Ambulatory Visit: Payer: Self-pay | Admitting: Nurse Practitioner

## 2019-09-21 ENCOUNTER — Other Ambulatory Visit: Payer: Self-pay | Admitting: Nurse Practitioner

## 2019-09-21 DIAGNOSIS — Z794 Long term (current) use of insulin: Secondary | ICD-10-CM

## 2019-09-21 DIAGNOSIS — R609 Edema, unspecified: Secondary | ICD-10-CM

## 2019-09-21 DIAGNOSIS — E1165 Type 2 diabetes mellitus with hyperglycemia: Secondary | ICD-10-CM

## 2019-09-24 DIAGNOSIS — H25041 Posterior subcapsular polar age-related cataract, right eye: Secondary | ICD-10-CM | POA: Diagnosis not present

## 2019-09-24 DIAGNOSIS — H25011 Cortical age-related cataract, right eye: Secondary | ICD-10-CM | POA: Diagnosis not present

## 2019-09-24 DIAGNOSIS — H2511 Age-related nuclear cataract, right eye: Secondary | ICD-10-CM | POA: Diagnosis not present

## 2019-10-01 ENCOUNTER — Encounter (INDEPENDENT_AMBULATORY_CARE_PROVIDER_SITE_OTHER): Payer: Medicare PPO | Admitting: Ophthalmology

## 2019-10-01 ENCOUNTER — Other Ambulatory Visit: Payer: Self-pay

## 2019-10-01 DIAGNOSIS — H43813 Vitreous degeneration, bilateral: Secondary | ICD-10-CM

## 2019-10-01 DIAGNOSIS — E11311 Type 2 diabetes mellitus with unspecified diabetic retinopathy with macular edema: Secondary | ICD-10-CM | POA: Diagnosis not present

## 2019-10-01 DIAGNOSIS — I10 Essential (primary) hypertension: Secondary | ICD-10-CM

## 2019-10-01 DIAGNOSIS — H35033 Hypertensive retinopathy, bilateral: Secondary | ICD-10-CM

## 2019-10-01 DIAGNOSIS — D3132 Benign neoplasm of left choroid: Secondary | ICD-10-CM | POA: Diagnosis not present

## 2019-10-01 DIAGNOSIS — E113513 Type 2 diabetes mellitus with proliferative diabetic retinopathy with macular edema, bilateral: Secondary | ICD-10-CM

## 2019-10-09 ENCOUNTER — Other Ambulatory Visit: Payer: Self-pay | Admitting: Nurse Practitioner

## 2019-10-09 DIAGNOSIS — E785 Hyperlipidemia, unspecified: Secondary | ICD-10-CM

## 2019-10-09 NOTE — Telephone Encounter (Signed)
30 day supply given today  ntbs for further refills

## 2019-10-13 ENCOUNTER — Encounter: Payer: Self-pay | Admitting: Nurse Practitioner

## 2019-10-23 LAB — HM DIABETES EYE EXAM

## 2019-11-05 ENCOUNTER — Other Ambulatory Visit: Payer: Self-pay

## 2019-11-05 ENCOUNTER — Encounter (INDEPENDENT_AMBULATORY_CARE_PROVIDER_SITE_OTHER): Payer: Medicare PPO | Admitting: Ophthalmology

## 2019-11-05 DIAGNOSIS — E113513 Type 2 diabetes mellitus with proliferative diabetic retinopathy with macular edema, bilateral: Secondary | ICD-10-CM | POA: Diagnosis not present

## 2019-11-05 DIAGNOSIS — H43813 Vitreous degeneration, bilateral: Secondary | ICD-10-CM | POA: Diagnosis not present

## 2019-11-05 DIAGNOSIS — E11311 Type 2 diabetes mellitus with unspecified diabetic retinopathy with macular edema: Secondary | ICD-10-CM

## 2019-11-05 DIAGNOSIS — I10 Essential (primary) hypertension: Secondary | ICD-10-CM | POA: Diagnosis not present

## 2019-11-05 DIAGNOSIS — H35033 Hypertensive retinopathy, bilateral: Secondary | ICD-10-CM | POA: Diagnosis not present

## 2019-11-05 DIAGNOSIS — D3132 Benign neoplasm of left choroid: Secondary | ICD-10-CM | POA: Diagnosis not present

## 2019-11-11 ENCOUNTER — Telehealth: Payer: Self-pay | Admitting: Nurse Practitioner

## 2019-11-11 DIAGNOSIS — E1165 Type 2 diabetes mellitus with hyperglycemia: Secondary | ICD-10-CM

## 2019-11-11 MED ORDER — TRESIBA FLEXTOUCH 200 UNIT/ML ~~LOC~~ SOPN: PEN_INJECTOR | SUBCUTANEOUS | 1 refills | Status: DC

## 2019-11-11 NOTE — Telephone Encounter (Signed)
televisit is ok but she needs lab work.

## 2019-11-11 NOTE — Telephone Encounter (Signed)
Patient was last seen I in March 2021.  Aware she needs to be seen but states she does not want to come into the office since she had recent cataracts surgery and because of COVID.  Requesting to do a tele phone visit.  Please advise if this is okay ? Also patient is now taking 80 units of Tresiba and will need a refill of a 3 month supply.

## 2019-11-12 MED ORDER — TRESIBA FLEXTOUCH 200 UNIT/ML ~~LOC~~ SOPN: PEN_INJECTOR | SUBCUTANEOUS | 1 refills | Status: DC

## 2019-11-12 NOTE — Addendum Note (Signed)
Addended by: [name] on: 11/12/2019 03:42 PM   Modules accepted: Orders

## 2019-11-15 ENCOUNTER — Other Ambulatory Visit: Payer: Self-pay | Admitting: Nurse Practitioner

## 2019-11-15 DIAGNOSIS — E1165 Type 2 diabetes mellitus with hyperglycemia: Secondary | ICD-10-CM

## 2019-11-15 DIAGNOSIS — Z794 Long term (current) use of insulin: Secondary | ICD-10-CM

## 2019-12-03 ENCOUNTER — Encounter (INDEPENDENT_AMBULATORY_CARE_PROVIDER_SITE_OTHER): Payer: Medicare PPO | Admitting: Ophthalmology

## 2019-12-03 ENCOUNTER — Other Ambulatory Visit: Payer: Self-pay

## 2019-12-03 DIAGNOSIS — H43813 Vitreous degeneration, bilateral: Secondary | ICD-10-CM | POA: Diagnosis not present

## 2019-12-03 DIAGNOSIS — H35033 Hypertensive retinopathy, bilateral: Secondary | ICD-10-CM

## 2019-12-03 DIAGNOSIS — E113513 Type 2 diabetes mellitus with proliferative diabetic retinopathy with macular edema, bilateral: Secondary | ICD-10-CM

## 2019-12-03 DIAGNOSIS — D3132 Benign neoplasm of left choroid: Secondary | ICD-10-CM

## 2019-12-03 DIAGNOSIS — E11311 Type 2 diabetes mellitus with unspecified diabetic retinopathy with macular edema: Secondary | ICD-10-CM | POA: Diagnosis not present

## 2019-12-03 DIAGNOSIS — I10 Essential (primary) hypertension: Secondary | ICD-10-CM | POA: Diagnosis not present

## 2019-12-06 ENCOUNTER — Other Ambulatory Visit: Payer: Self-pay | Admitting: Nurse Practitioner

## 2019-12-06 DIAGNOSIS — E785 Hyperlipidemia, unspecified: Secondary | ICD-10-CM

## 2019-12-18 ENCOUNTER — Other Ambulatory Visit: Payer: Self-pay | Admitting: Nurse Practitioner

## 2019-12-18 DIAGNOSIS — I10 Essential (primary) hypertension: Secondary | ICD-10-CM

## 2019-12-31 ENCOUNTER — Encounter (INDEPENDENT_AMBULATORY_CARE_PROVIDER_SITE_OTHER): Payer: Medicare PPO | Admitting: Ophthalmology

## 2019-12-31 ENCOUNTER — Other Ambulatory Visit: Payer: Self-pay

## 2019-12-31 DIAGNOSIS — I10 Essential (primary) hypertension: Secondary | ICD-10-CM | POA: Diagnosis not present

## 2019-12-31 DIAGNOSIS — D3132 Benign neoplasm of left choroid: Secondary | ICD-10-CM

## 2019-12-31 DIAGNOSIS — H43813 Vitreous degeneration, bilateral: Secondary | ICD-10-CM

## 2019-12-31 DIAGNOSIS — E113513 Type 2 diabetes mellitus with proliferative diabetic retinopathy with macular edema, bilateral: Secondary | ICD-10-CM | POA: Diagnosis not present

## 2019-12-31 DIAGNOSIS — E11311 Type 2 diabetes mellitus with unspecified diabetic retinopathy with macular edema: Secondary | ICD-10-CM

## 2019-12-31 DIAGNOSIS — H35033 Hypertensive retinopathy, bilateral: Secondary | ICD-10-CM

## 2020-01-01 ENCOUNTER — Other Ambulatory Visit: Payer: Self-pay | Admitting: Nurse Practitioner

## 2020-01-01 DIAGNOSIS — E1165 Type 2 diabetes mellitus with hyperglycemia: Secondary | ICD-10-CM

## 2020-01-01 DIAGNOSIS — IMO0002 Reserved for concepts with insufficient information to code with codable children: Secondary | ICD-10-CM

## 2020-01-01 DIAGNOSIS — E1136 Type 2 diabetes mellitus with diabetic cataract: Secondary | ICD-10-CM

## 2020-01-09 ENCOUNTER — Other Ambulatory Visit: Payer: Self-pay | Admitting: Nurse Practitioner

## 2020-01-09 DIAGNOSIS — I10 Essential (primary) hypertension: Secondary | ICD-10-CM

## 2020-01-28 ENCOUNTER — Other Ambulatory Visit: Payer: Self-pay

## 2020-01-28 ENCOUNTER — Encounter (INDEPENDENT_AMBULATORY_CARE_PROVIDER_SITE_OTHER): Payer: Medicare PPO | Admitting: Ophthalmology

## 2020-01-28 DIAGNOSIS — E11311 Type 2 diabetes mellitus with unspecified diabetic retinopathy with macular edema: Secondary | ICD-10-CM | POA: Diagnosis not present

## 2020-01-28 DIAGNOSIS — I10 Essential (primary) hypertension: Secondary | ICD-10-CM | POA: Diagnosis not present

## 2020-01-28 DIAGNOSIS — E113513 Type 2 diabetes mellitus with proliferative diabetic retinopathy with macular edema, bilateral: Secondary | ICD-10-CM | POA: Diagnosis not present

## 2020-01-28 DIAGNOSIS — D3132 Benign neoplasm of left choroid: Secondary | ICD-10-CM

## 2020-01-28 DIAGNOSIS — H35033 Hypertensive retinopathy, bilateral: Secondary | ICD-10-CM | POA: Diagnosis not present

## 2020-01-28 DIAGNOSIS — H43813 Vitreous degeneration, bilateral: Secondary | ICD-10-CM | POA: Diagnosis not present

## 2020-02-13 ENCOUNTER — Other Ambulatory Visit: Payer: Self-pay | Admitting: Nurse Practitioner

## 2020-02-13 DIAGNOSIS — I10 Essential (primary) hypertension: Secondary | ICD-10-CM

## 2020-02-26 ENCOUNTER — Encounter (INDEPENDENT_AMBULATORY_CARE_PROVIDER_SITE_OTHER): Payer: Medicare PPO | Admitting: Ophthalmology

## 2020-02-26 ENCOUNTER — Other Ambulatory Visit: Payer: Self-pay

## 2020-02-26 DIAGNOSIS — H43813 Vitreous degeneration, bilateral: Secondary | ICD-10-CM | POA: Diagnosis not present

## 2020-02-26 DIAGNOSIS — D3132 Benign neoplasm of left choroid: Secondary | ICD-10-CM | POA: Diagnosis not present

## 2020-02-26 DIAGNOSIS — E113513 Type 2 diabetes mellitus with proliferative diabetic retinopathy with macular edema, bilateral: Secondary | ICD-10-CM

## 2020-02-26 DIAGNOSIS — H35033 Hypertensive retinopathy, bilateral: Secondary | ICD-10-CM

## 2020-02-26 DIAGNOSIS — I10 Essential (primary) hypertension: Secondary | ICD-10-CM

## 2020-03-06 ENCOUNTER — Other Ambulatory Visit: Payer: Self-pay | Admitting: Nurse Practitioner

## 2020-03-06 DIAGNOSIS — I10 Essential (primary) hypertension: Secondary | ICD-10-CM

## 2020-03-06 DIAGNOSIS — E785 Hyperlipidemia, unspecified: Secondary | ICD-10-CM

## 2020-03-09 ENCOUNTER — Ambulatory Visit (INDEPENDENT_AMBULATORY_CARE_PROVIDER_SITE_OTHER): Payer: Medicare PPO | Admitting: Nurse Practitioner

## 2020-03-09 ENCOUNTER — Encounter: Payer: Self-pay | Admitting: Nurse Practitioner

## 2020-03-09 ENCOUNTER — Other Ambulatory Visit: Payer: Self-pay | Admitting: Nurse Practitioner

## 2020-03-09 DIAGNOSIS — E1165 Type 2 diabetes mellitus with hyperglycemia: Secondary | ICD-10-CM

## 2020-03-09 DIAGNOSIS — E1136 Type 2 diabetes mellitus with diabetic cataract: Secondary | ICD-10-CM

## 2020-03-09 DIAGNOSIS — I10 Essential (primary) hypertension: Secondary | ICD-10-CM

## 2020-03-09 DIAGNOSIS — R609 Edema, unspecified: Secondary | ICD-10-CM | POA: Diagnosis not present

## 2020-03-09 DIAGNOSIS — K219 Gastro-esophageal reflux disease without esophagitis: Secondary | ICD-10-CM | POA: Diagnosis not present

## 2020-03-09 DIAGNOSIS — E782 Mixed hyperlipidemia: Secondary | ICD-10-CM | POA: Diagnosis not present

## 2020-03-09 DIAGNOSIS — E785 Hyperlipidemia, unspecified: Secondary | ICD-10-CM | POA: Diagnosis not present

## 2020-03-09 DIAGNOSIS — R600 Localized edema: Secondary | ICD-10-CM

## 2020-03-09 DIAGNOSIS — IMO0002 Reserved for concepts with insufficient information to code with codable children: Secondary | ICD-10-CM

## 2020-03-09 DIAGNOSIS — Z794 Long term (current) use of insulin: Secondary | ICD-10-CM | POA: Diagnosis not present

## 2020-03-09 MED ORDER — TRESIBA FLEXTOUCH 200 UNIT/ML ~~LOC~~ SOPN: 150.0000 [IU] | PEN_INJECTOR | Freq: Every day | SUBCUTANEOUS | 1 refills | Status: DC

## 2020-03-09 MED ORDER — METOPROLOL SUCCINATE ER 100 MG PO TB24: 100.0000 mg | ORAL_TABLET | Freq: Every day | ORAL | 0 refills | Status: DC

## 2020-03-09 MED ORDER — ATORVASTATIN CALCIUM 80 MG PO TABS: 80.0000 mg | ORAL_TABLET | Freq: Every day | ORAL | 0 refills | Status: DC

## 2020-03-09 MED ORDER — FUROSEMIDE 40 MG PO TABS: 40.0000 mg | ORAL_TABLET | Freq: Every day | ORAL | 0 refills | Status: DC

## 2020-03-09 MED ORDER — JANUMET XR 50-1000 MG PO TB24: 1.0000 | ORAL_TABLET | Freq: Every day | ORAL | 0 refills | Status: DC

## 2020-03-09 MED ORDER — LISINOPRIL 20 MG PO TABS: 20.0000 mg | ORAL_TABLET | Freq: Two times a day (BID) | ORAL | 1 refills | Status: DC

## 2020-03-09 NOTE — Progress Notes (Signed)
Virtual Visit via telephone Note Due to COVID-19 pandemic this visit was conducted virtually. This visit type was conducted due to national recommendations for restrictions regarding the COVID-19 Pandemic (e.g. social distancing, sheltering in place) in an effort to limit this patient's exposure and mitigate transmission in our community. All issues noted in this document were discussed and addressed.  A physical exam was not performed with this format.  I connected with Emma Griffin on 03/09/20 at 1:15 by telephone and verified that I am speaking with the correct person using two identifiers. Emma Griffin is currently located at home and no one is currently with her during visit. The provider, Mary-Margaret Hassell Done, [name] is located in their office at time of visit.  I discussed the limitations, risks, security and privacy concerns of performing an evaluation and management service by telephone and the availability of in person appointments. I also discussed with the patient that there may be a patient responsible charge related to this service. The patient expressed understanding and agreed to proceed.   History and Present Illness:   Chief Complaint: medical management of chronic issues      HPI:  1. Primary hypertension No c/o chest pain, sob or headaches. She does not check with blood pressure at home. BP Readings from Last 3 Encounters:  05/30/19 139/67  01/18/18 (!) 144/81  10/08/17 133/72     2. Mixed hyperlipidemia Does not watch diet and does very little exercise if any. Lab Results  Component Value Date   CHOL 124 05/30/2019   HDL 34 (L) 05/30/2019   LDLCALC 63 05/30/2019   TRIG 157 (H) 05/30/2019   CHOLHDL 3.6 05/30/2019     3. Type 2 diabetes mellitus with hyperglycemia, with long-term current use of insulin ([place]) She says she has fallen off the wagon. She has moved back in with her son and has not been watching diet. She has increased her tresiba back up to  130u a day. She is having blood sugars as high as 300 at times. She would like to get a [place]. She checks her blood sugars 4 x a day.  Lab Results  Component Value Date   HGBA1C 7.0 (H) 05/30/2019     4. Type 2 diabetes, uncontrolled, with diabetic cataract ([place]) Has had cataract surgery  5. Gastroesophageal reflux disease without esophagitis Has been doing well without meds  6. Peripheral edema Elevate legs when sitting  7. Morbid obesity ([place]) Has gained some weight since she moved in with her son. Weight was 262 at home this morning. Wt Readings from Last 3 Encounters:  05/30/19 251 lb (113.9 kg)  01/18/18 273 lb (123.8 kg)  10/08/17 282 lb (127.9 kg)   BMI Readings from Last 3 Encounters:  05/30/19 38.16 kg/m  01/18/18 41.51 kg/m  10/08/17 42.88 kg/m       Outpatient Encounter Medications as of 03/09/2020  Medication Sig  . Accu-Chek Softclix Lancets lancets TEST 4 TIMES DAILY Dx E11.6  . Ascorbic Acid (VITAMIN C) 1000 MG tablet Take 1,000 mg by mouth daily.  Marland Kitchen atorvastatin (LIPITOR) 80 MG tablet Take 1 tablet (80 mg total) by mouth daily at 6 PM. Needs appointment for further refills  . bevacizumab (AVASTIN) 1.25 mg/0.1 mL SOLN Apply 1.25 mg to eye See admin instructions. Opthalmic injection every 4-6 weeks  . Blood Glucose Monitoring Suppl (ACCU-CHEK GUIDE ME) w/Device KIT 1 each by Other route 4 (four) times daily. Dx E11.9  . Calcium Carbonate-Vitamin D ([facility]S CALCIUM/VIT D SOFT  CHEWS PO) Take 1 tablet by mouth daily.   . [facility]S ASPIRIN ADULT LOW DOSE 81 MG chewable tablet CHEW 1 TABLET (81 MG TOTAL) BY MOUTH DAILY.  . furosemide (LASIX) 40 MG tablet Take 1 tablet (40 mg total) by mouth daily. (Needs to be seen before next refill)  . gatifloxacin (ZYMAR) 0.3 % ophthalmic drops Place 1 drop into the right eye 4 (four) times daily. Used for 2 days after surgery  . glucose blood (ACCU-CHEK GUIDE) test strip Test up to 4 times daily Dx E11.65  . ibuprofen (ADVIL,MOTRIN)  200 MG tablet Take 400-600 mg by mouth every 6 (six) hours as needed. For pain/cramps  . insulin degludec (TRESIBA FLEXTOUCH) 200 UNIT/ML FlexTouch Pen INJECT 52 UNITS INTO THE SKIN DAILY.  Marland Kitchen insulin lispro (HUMALOG KWIKPEN) 100 UNIT/ML KwikPen Inject 0.4 mLs (40 Units total) into the skin 3 (three) times daily. (Needs to be seen before next refill) (Patient not taking: Reported on 05/30/2019)  . Insulin Pen Needle (NOVOFINE) 32G X 6 MM MISC Use with insulin pen 4 times daily Dx E11.6  . lisinopril (ZESTRIL) 20 MG tablet TAKE 1 TABLET BY MOUTH TWICE A DAY  . lisinopril (ZESTRIL) 20 MG tablet TAKE 1 TABLET BY MOUTH TWICE A DAY  . metoprolol succinate (TOPROL-XL) 100 MG 24 hr tablet TAKE 1 TABLET (100 MG TOTAL) BY MOUTH DAILY. (NEEDS TO BE SEEN BEFORE NEXT REFILL)  . Multiple Vitamins-Minerals (AIRBORNE) CHEW Chew 1 tablet by mouth as needed.   . nitroGLYCERIN (NITROSTAT) 0.4 MG SL tablet Place under the tongue.  . SitaGLIPtin-MetFORMIN HCl (JANUMET XR) 50-1000 MG TB24 Take 1 tablet by mouth daily. with food (Needs to be seen before next refill)   No facility-administered encounter medications on file as of 03/09/2020.    Past Surgical History:  Procedure Laterality Date  . [place]   x 1  . EYE SURGERY     weak muscle eye surgery as child  . HYSTEROSCOPY WITH D & C  09/20/2011   Procedure: DILATATION AND CURETTAGE /HYSTEROSCOPY;  Surgeon: Allyn Kenner, DO;  Location: [facility];  Service: Gynecology;  Laterality: N/A;  colposcopy  . LITHOTRIPSY     kidney stone  . REFRACTIVE SURGERY     broken blood vessels behind eyes - bilaterally  . right side wisdom teeth ext     still has left wisdom teeth top/botom  . TOOTH EXTRACTION     upper and lower back right    Family History  Problem Relation Age of Onset  . Diabetes Mother   . Hypertension Mother   . Atrial fibrillation Mother   . Heart attack Father   . Diabetes Sister     New complaints: None today  Social  history: Lives with her son  Controlled substance contract: n/a    Review of Systems  Constitutional: Negative for diaphoresis and weight loss.  Eyes: Negative for blurred vision, double vision and pain.  Respiratory: Negative for shortness of breath.   Cardiovascular: Negative for chest pain, palpitations, orthopnea and leg swelling.  Gastrointestinal: Negative for abdominal pain.  Skin: Negative for rash.  Neurological: Negative for dizziness, sensory change, loss of consciousness, weakness and headaches.  Endo/Heme/Allergies: Negative for polydipsia. Does not bruise/bleed easily.  Psychiatric/Behavioral: Negative for memory loss. The patient does not have insomnia.   All other systems reviewed and are negative.    Observations/Objective: Alert and oriented- answers all questions appropriately No distress    Assessment and Plan: Emma Griffin  comes in today with chief complaint of No chief complaint on file.   Diagnosis and orders addressed:  1. Primary hypertension Low sodium diet - metoprolol succinate (TOPROL-XL) 100 MG 24 hr tablet; Take 1 tablet (100 mg total) by mouth daily. (Needs to be seen before next refill)  Dispense: 30 tablet; Refill: 0 - lisinopril (ZESTRIL) 20 MG tablet; Take 1 tablet (20 mg total) by mouth 2 (two) times daily.  Dispense: 180 tablet; Refill: 1  2. Mixed hyperlipidemia Low fat diet - atorvastatin (LIPITOR) 80 MG tablet; Take 1 tablet (80 mg total) by mouth daily at 6 PM. Needs appointment for further refills  Dispense: 30 tablet; Refill: 0  3. Type 2 diabetes mellitus with hyperglycemia, with long-term current use of insulin (HCC) Strict carb counting Was dong better when she lived with her sister - SitaGLIPtin-MetFORMIN HCl (JANUMET XR) 50-1000 MG TB24; Take 1 tablet by mouth daily. with food (Needs to be seen before next refill)  Dispense: 30 tablet; Refill: 0 - insulin degludec (TRESIBA FLEXTOUCH) 200 UNIT/ML FlexTouch Pen; Inject 150  Units into the skin daily. INJECT 52 UNITS INTO THE SKIN DAILY.  Dispense: 45 mL; Refill: 1  4. Type 2 diabetes, uncontrolled, with diabetic cataract ([place]) Keep follow up with eye surgeon  5. Gastroesophageal reflux disease without esophagitis Avoid spicy foods Do not eat 2 hours prior to bedtime  6. Peripheral edema Elevate legs when sitting - furosemide (LASIX) 40 MG tablet; Take 1 tablet (40 mg total) by mouth daily. (Needs to be seen before next refill)  Dispense: 30 tablet; Refill: 0  7. Morbid obesity ([place]) Discussed diet and exercise for person with BMI >25 Will recheck weight in 3-6 months  Patient agreed to come in Friday to do blood work- told I cannot continue to treat her without labs.   Labs pending Health Maintenance reviewed Diet and exercise encouraged   Follow Up Instructions: 3 month    I discussed the assessment and treatment plan with the patient. The patient was provided an opportunity to ask questions and all were answered. The patient agreed with the plan and demonstrated an understanding of the instructions.   The patient was advised to call back or seek an in-person evaluation if the symptoms worsen or if the condition fails to improve as anticipated.  The above assessment and management plan was discussed with the patient. The patient verbalized understanding of and has agreed to the management plan. Patient is aware to call the clinic if symptoms persist or worsen. Patient is aware when to return to the clinic for a follow-up visit. Patient educated on when it is appropriate to go to the emergency department.   Time call ended:  1:35  I provided 20 minutes of non-face-to-face time during this encounter.    Mary-Margaret Hassell Done, [name]

## 2020-03-10 NOTE — Telephone Encounter (Signed)
Pharmacy comment:  Script Clarification:PLEASE CLARIFY DIRECTIONS. 150 OR 52 UNITS DAILY?

## 2020-03-12 ENCOUNTER — Other Ambulatory Visit: Payer: Medicare PPO

## 2020-03-12 ENCOUNTER — Other Ambulatory Visit: Payer: Self-pay

## 2020-03-12 DIAGNOSIS — E782 Mixed hyperlipidemia: Secondary | ICD-10-CM

## 2020-03-12 DIAGNOSIS — Z794 Long term (current) use of insulin: Secondary | ICD-10-CM

## 2020-03-12 DIAGNOSIS — I10 Essential (primary) hypertension: Secondary | ICD-10-CM | POA: Diagnosis not present

## 2020-03-12 DIAGNOSIS — E1165 Type 2 diabetes mellitus with hyperglycemia: Secondary | ICD-10-CM | POA: Diagnosis not present

## 2020-03-12 LAB — BAYER DCA HB A1C WAIVED: HB A1C (BAYER DCA - WAIVED): >14 % — ABNORMAL HIGH (ref <7.0)

## 2020-03-13 LAB — CMP14+EGFR
ALT: 16 IU/L (ref 0–32)
AST: 14 IU/L (ref 0–40)
Albumin/Globulin Ratio: 1 — ABNORMAL LOW (ref 1.2–2.2)
Albumin: 3.5 g/dL — ABNORMAL LOW (ref 3.8–4.8)
Alkaline Phosphatase: 109 IU/L (ref 44–121)
BUN/Creatinine Ratio: 15 (ref 12–28)
BUN: 15 mg/dL (ref 8–27)
Bilirubin Total: 0.3 mg/dL (ref 0.0–1.2)
CO2: 25 mmol/L (ref 20–29)
Calcium: 9.2 mg/dL (ref 8.7–10.3)
Chloride: 101 mmol/L (ref 96–106)
Creatinine, Ser: 0.98 mg/dL (ref 0.57–1.00)
GFR calc Af Amer: 68 mL/min/{1.73_m2} (ref >59)
GFR calc non Af Amer: 59 mL/min/{1.73_m2} — ABNORMAL LOW (ref >59)
Globulin, Total: 3.4 g/dL (ref 1.5–4.5)
Glucose: 115 mg/dL — ABNORMAL HIGH (ref 65–99)
Potassium: 3.9 mmol/L (ref 3.5–5.2)
Sodium: 142 mmol/L (ref 134–144)
Total Protein: 6.9 g/dL (ref 6.0–8.5)

## 2020-03-13 LAB — CBC WITH DIFFERENTIAL/PLATELET
Basophils Absolute: 0.1 10*3/uL (ref 0.0–0.2)
Basos: 1 %
EOS (ABSOLUTE): 0.3 10*3/uL (ref 0.0–0.4)
Eos: 3 %
Hematocrit: 40.3 % (ref 34.0–46.6)
Hemoglobin: 13.3 g/dL (ref 11.1–15.9)
Immature Grans (Abs): 0.1 10*3/uL (ref 0.0–0.1)
Immature Granulocytes: 1 %
Lymphocytes Absolute: 3.8 10*3/uL — ABNORMAL HIGH (ref 0.7–3.1)
Lymphs: 34 %
MCH: 26.4 pg — ABNORMAL LOW (ref 26.6–33.0)
MCHC: 33 g/dL (ref 31.5–35.7)
MCV: 80 fL (ref 79–97)
Monocytes Absolute: 0.9 10*3/uL (ref 0.1–0.9)
Monocytes: 8 %
Neutrophils Absolute: 5.9 10*3/uL (ref 1.4–7.0)
Neutrophils: 53 %
Platelets: 209 10*3/uL (ref 150–450)
RBC: 5.03 x10E6/uL (ref 3.77–5.28)
RDW: 13.2 % (ref 11.7–15.4)
WBC: 11.1 10*3/uL — ABNORMAL HIGH (ref 3.4–10.8)

## 2020-03-13 LAB — LIPID PANEL
Chol/HDL Ratio: 3.3 ratio (ref 0.0–4.4)
Cholesterol, Total: 107 mg/dL (ref 100–199)
HDL: 32 mg/dL — ABNORMAL LOW (ref >39)
LDL Chol Calc (NIH): 46 mg/dL (ref 0–99)
Triglycerides: 176 mg/dL — ABNORMAL HIGH (ref 0–149)
VLDL Cholesterol Cal: 29 mg/dL (ref 5–40)

## 2020-03-15 ENCOUNTER — Telehealth: Payer: Self-pay

## 2020-03-15 ENCOUNTER — Other Ambulatory Visit: Payer: Self-pay | Admitting: Nurse Practitioner

## 2020-03-15 DIAGNOSIS — E1165 Type 2 diabetes mellitus with hyperglycemia: Secondary | ICD-10-CM

## 2020-03-15 DIAGNOSIS — R609 Edema, unspecified: Secondary | ICD-10-CM

## 2020-03-15 DIAGNOSIS — I10 Essential (primary) hypertension: Secondary | ICD-10-CM

## 2020-03-15 DIAGNOSIS — E785 Hyperlipidemia, unspecified: Secondary | ICD-10-CM

## 2020-03-15 MED ORDER — JANUMET XR 50-1000 MG PO TB24
1.0000 | ORAL_TABLET | Freq: Every day | ORAL | 0 refills | Status: DC
Start: 2020-03-15 — End: 2020-06-08

## 2020-03-15 MED ORDER — ATORVASTATIN CALCIUM 80 MG PO TABS: 80.0000 mg | ORAL_TABLET | Freq: Every day | ORAL | 0 refills | Status: DC

## 2020-03-15 MED ORDER — METOPROLOL SUCCINATE ER 100 MG PO TB24: 100.0000 mg | ORAL_TABLET | Freq: Every day | ORAL | 0 refills | Status: DC

## 2020-03-15 MED ORDER — FUROSEMIDE 40 MG PO TABS: 40.0000 mg | ORAL_TABLET | Freq: Every day | ORAL | 0 refills | Status: DC

## 2020-03-15 MED ORDER — FREESTYLE LIBRE 2 SENSOR MISC: 5 refills | Status: DC

## 2020-03-15 MED ORDER — FREESTYLE LIBRE 2 READER DEVI: 0 refills | Status: DC

## 2020-03-15 NOTE — Telephone Encounter (Signed)
Patient aware, [facility] and if cost prohibitive will contact [name] for advice.

## 2020-03-15 NOTE — Telephone Encounter (Signed)
Patient was seen last week and needs meds sent in for 90 day instead of 30 day.  I have taken care of that and sent those in for her.  She also said you had wanted her to call and remind you to talk to [name] today about a continuous meter for her.

## 2020-03-15 NOTE — Telephone Encounter (Signed)
   Libre 2 CGM [facility]  Given patient has dual state insurance/medicare--we may have to send to [place] usually run $40 a month--some plans are free it just depends  I will continue to follow

## 2020-03-15 NOTE — Telephone Encounter (Signed)
Could you find out how much dexcom or Elenor [place] would be for her?

## 2020-03-16 NOTE — Telephone Encounter (Signed)
Emma Griffin, following is the note from the pharmacy, I have already replaced with the True metrix meter & test strips But what do you think about the Dexcom

## 2020-03-24 NOTE — Telephone Encounter (Signed)
RX benefits determination--patient has [employer]  Will need to submit to [employer] (medical device company) and obtain under Part B benefits

## 2020-03-26 ENCOUNTER — Encounter (INDEPENDENT_AMBULATORY_CARE_PROVIDER_SITE_OTHER): Payer: Medicare PPO | Admitting: Ophthalmology

## 2020-03-29 ENCOUNTER — Encounter (INDEPENDENT_AMBULATORY_CARE_PROVIDER_SITE_OTHER): Payer: Medicare PPO | Admitting: Ophthalmology

## 2020-03-29 ENCOUNTER — Other Ambulatory Visit: Payer: Self-pay

## 2020-03-29 DIAGNOSIS — H43813 Vitreous degeneration, bilateral: Secondary | ICD-10-CM

## 2020-03-29 DIAGNOSIS — E113513 Type 2 diabetes mellitus with proliferative diabetic retinopathy with macular edema, bilateral: Secondary | ICD-10-CM

## 2020-03-29 DIAGNOSIS — I10 Essential (primary) hypertension: Secondary | ICD-10-CM | POA: Diagnosis not present

## 2020-03-29 DIAGNOSIS — H35033 Hypertensive retinopathy, bilateral: Secondary | ICD-10-CM

## 2020-03-29 LAB — HM DIABETES EYE EXAM

## 2020-04-26 ENCOUNTER — Other Ambulatory Visit: Payer: Self-pay

## 2020-04-26 ENCOUNTER — Encounter (INDEPENDENT_AMBULATORY_CARE_PROVIDER_SITE_OTHER): Payer: Medicare PPO | Admitting: Ophthalmology

## 2020-04-26 DIAGNOSIS — I10 Essential (primary) hypertension: Secondary | ICD-10-CM

## 2020-04-26 DIAGNOSIS — D3132 Benign neoplasm of left choroid: Secondary | ICD-10-CM | POA: Diagnosis not present

## 2020-04-26 DIAGNOSIS — H43813 Vitreous degeneration, bilateral: Secondary | ICD-10-CM

## 2020-04-26 DIAGNOSIS — H35033 Hypertensive retinopathy, bilateral: Secondary | ICD-10-CM

## 2020-04-26 DIAGNOSIS — E113513 Type 2 diabetes mellitus with proliferative diabetic retinopathy with macular edema, bilateral: Secondary | ICD-10-CM | POA: Diagnosis not present

## 2020-05-17 DIAGNOSIS — E113513 Type 2 diabetes mellitus with proliferative diabetic retinopathy with macular edema, bilateral: Secondary | ICD-10-CM | POA: Diagnosis not present

## 2020-05-17 DIAGNOSIS — H40013 Open angle with borderline findings, low risk, bilateral: Secondary | ICD-10-CM | POA: Diagnosis not present

## 2020-05-17 LAB — HM DIABETES EYE EXAM

## 2020-05-24 ENCOUNTER — Encounter (INDEPENDENT_AMBULATORY_CARE_PROVIDER_SITE_OTHER): Payer: Medicare PPO | Admitting: Ophthalmology

## 2020-05-24 ENCOUNTER — Other Ambulatory Visit: Payer: Self-pay

## 2020-05-24 DIAGNOSIS — E113513 Type 2 diabetes mellitus with proliferative diabetic retinopathy with macular edema, bilateral: Secondary | ICD-10-CM

## 2020-05-24 DIAGNOSIS — I10 Essential (primary) hypertension: Secondary | ICD-10-CM | POA: Diagnosis not present

## 2020-05-24 DIAGNOSIS — H43813 Vitreous degeneration, bilateral: Secondary | ICD-10-CM

## 2020-05-24 DIAGNOSIS — D3132 Benign neoplasm of left choroid: Secondary | ICD-10-CM | POA: Diagnosis not present

## 2020-05-24 DIAGNOSIS — H35033 Hypertensive retinopathy, bilateral: Secondary | ICD-10-CM | POA: Diagnosis not present

## 2020-06-01 ENCOUNTER — Other Ambulatory Visit: Payer: Self-pay | Admitting: Nurse Practitioner

## 2020-06-01 DIAGNOSIS — Z794 Long term (current) use of insulin: Secondary | ICD-10-CM

## 2020-06-01 DIAGNOSIS — E1165 Type 2 diabetes mellitus with hyperglycemia: Secondary | ICD-10-CM

## 2020-06-08 ENCOUNTER — Other Ambulatory Visit: Payer: Self-pay | Admitting: Nurse Practitioner

## 2020-06-08 ENCOUNTER — Ambulatory Visit: Payer: Medicare PPO | Admitting: Nurse Practitioner

## 2020-06-08 ENCOUNTER — Encounter: Payer: Self-pay | Admitting: Nurse Practitioner

## 2020-06-08 ENCOUNTER — Other Ambulatory Visit: Payer: Self-pay

## 2020-06-08 VITALS — BP 169/75 | HR 71 | Temp 97.5°F | Resp 20 | Ht 68.0 in | Wt 271.0 lb

## 2020-06-08 DIAGNOSIS — E785 Hyperlipidemia, unspecified: Secondary | ICD-10-CM | POA: Diagnosis not present

## 2020-06-08 DIAGNOSIS — Z794 Long term (current) use of insulin: Secondary | ICD-10-CM | POA: Diagnosis not present

## 2020-06-08 DIAGNOSIS — R609 Edema, unspecified: Secondary | ICD-10-CM

## 2020-06-08 DIAGNOSIS — E1165 Type 2 diabetes mellitus with hyperglycemia: Secondary | ICD-10-CM | POA: Diagnosis not present

## 2020-06-08 DIAGNOSIS — E782 Mixed hyperlipidemia: Secondary | ICD-10-CM | POA: Diagnosis not present

## 2020-06-08 DIAGNOSIS — K219 Gastro-esophageal reflux disease without esophagitis: Secondary | ICD-10-CM | POA: Diagnosis not present

## 2020-06-08 DIAGNOSIS — I10 Essential (primary) hypertension: Secondary | ICD-10-CM | POA: Diagnosis not present

## 2020-06-08 LAB — BAYER DCA HB A1C WAIVED: HB A1C (BAYER DCA - WAIVED): 13.2 % — ABNORMAL HIGH (ref <7.0)

## 2020-06-08 MED ORDER — TRESIBA FLEXTOUCH 200 UNIT/ML ~~LOC~~ SOPN: 160.0000 [IU] | PEN_INJECTOR | Freq: Every day | SUBCUTANEOUS | 0 refills | Status: DC

## 2020-06-08 MED ORDER — INSULIN LISPRO (1 UNIT DIAL) 100 UNIT/ML (KWIKPEN): PEN_INJECTOR | SUBCUTANEOUS | 1 refills | Status: DC

## 2020-06-08 MED ORDER — FUROSEMIDE 40 MG PO TABS
40.0000 mg | ORAL_TABLET | Freq: Every day | ORAL | 0 refills | Status: DC
Start: 2020-06-08 — End: 2020-09-09

## 2020-06-08 MED ORDER — ASPIRIN 81 MG PO CHEW: 81.0000 mg | CHEWABLE_TABLET | Freq: Every day | ORAL | 1 refills | Status: DC

## 2020-06-08 MED ORDER — JANUMET XR 50-1000 MG PO TB24: 1.0000 | ORAL_TABLET | Freq: Every day | ORAL | 0 refills | Status: DC

## 2020-06-08 MED ORDER — ATORVASTATIN CALCIUM 80 MG PO TABS: 80.0000 mg | ORAL_TABLET | Freq: Every day | ORAL | 0 refills | Status: DC

## 2020-06-08 MED ORDER — INSULIN ASPART 100 UNIT/ML FLEXPEN: PEN_INJECTOR | SUBCUTANEOUS | 11 refills | Status: DC

## 2020-06-08 NOTE — Patient Instructions (Signed)

## 2020-06-08 NOTE — Addendum Note (Signed)
Addended by: [name] on: 06/08/2020 04:42 PM   Modules accepted: Orders

## 2020-06-08 NOTE — Telephone Encounter (Signed)
Per pharmacy;, Humalog is not covered by patient's insurance, preferred is Novolog.  If this is okay, please change prescription.

## 2020-06-08 NOTE — Progress Notes (Signed)
Subjective:    Patient ID: Emma Griffin, female    DOB: 08-29-1950, 70 y.o.   MRN: [id number]   Chief Complaint: Medical Management of Chronic Issues    HPI:  1. Primary hypertension No c/o chest pain, sob or headache. Does not check blood pressure at home. Sh etakes her lisinopril BID rather then all at one time a day. BP Readings from Last 3 Encounters:  06/08/20 (!) 169/75  05/30/19 139/67  01/18/18 (!) 144/81      2. Mixed hyperlipidemia Does not really watch diet very closely since she moved back in with her son. Does no exercise. Lab Results  Component Value Date   CHOL 107 03/12/2020   HDL 32 (L) 03/12/2020   LDLCALC 46 03/12/2020   TRIG 176 (H) 03/12/2020   CHOLHDL 3.3 03/12/2020     3. Type 2 diabetes mellitus with hyperglycemia, with long-term current use of insulin (HCC) Fasting blood sugars are in 200 and 300. She has not been watching her diet. She is on tresiba 150 daily . She has not been doing her fast acting 40u 3x a day. She checks her blood sugars 3 times a day. Lab Results  Component Value Date   HGBA1C >14.0 (H) 03/12/2020     4. Gastroesophageal reflux disease without esophagitis Is currently not taking any prescription meds for reflux.  5. Peripheral edema has daily. Elevating helps  6. Morbid obesity ([place]) weight is up 20lbs from previous visit Wt Readings from Last 3 Encounters:  06/08/20 271 lb (122.9 kg)  05/30/19 251 lb (113.9 kg)  01/18/18 273 lb (123.8 kg)   BMI Readings from Last 3 Encounters:  06/08/20 41.21 kg/m  05/30/19 38.16 kg/m  01/18/18 41.51 kg/m       Outpatient Encounter Medications as of 06/08/2020  Medication Sig  . Accu-Chek Softclix Lancets lancets TEST 4 TIMES DAILY Dx E11.6  . Ascorbic Acid (VITAMIN C) 1000 MG tablet Take 1,000 mg by mouth daily.  Marland Kitchen atorvastatin (LIPITOR) 80 MG tablet Take 1 tablet (80 mg total) by mouth daily at 6 PM.  . bevacizumab (AVASTIN) 1.25 mg/0.1 mL SOLN Apply 1.25 mg to  eye See admin instructions. Opthalmic injection every 4-6 weeks  . Calcium Carbonate-Vitamin D ([employer]S CALCIUM/VIT D SOFT CHEWS PO) Take 1 tablet by mouth daily.   . Continuous Blood Gluc Receiver (FREESTYLE LIBRE 2 READER) DEVI Use to test blood sugars 6 times daily as directed. DX: E11.65  . Continuous Blood Gluc Sensor (FREESTYLE LIBRE 2 SENSOR) MISC Use to test blood sugars 6 times daily as directed. DX: E11.65  . CVS ASPIRIN ADULT LOW DOSE 81 MG chewable tablet CHEW 1 TABLET (81 MG TOTAL) BY MOUTH DAILY.  . furosemide (LASIX) 40 MG tablet Take 1 tablet (40 mg total) by mouth daily.  Marland Kitchen gatifloxacin (ZYMAR) 0.3 % ophthalmic drops Place 1 drop into the right eye 4 (four) times daily. Used for 2 days after surgery  . ibuprofen (ADVIL,MOTRIN) 200 MG tablet Take 400-600 mg by mouth every 6 (six) hours as needed. For pain/cramps  . insulin degludec (TRESIBA FLEXTOUCH) 200 UNIT/ML FlexTouch Pen Inject 150 Units into the skin 2 (two) times daily.  . insulin lispro (HUMALOG KWIKPEN) 100 UNIT/ML KwikPen Inject 0.4 mLs (40 Units total) into the skin 3 (three) times daily. (Needs to be seen before next refill) (Patient not taking: Reported on 05/30/2019)  . Insulin Pen Needle (NOVOFINE) 32G X 6 MM MISC Use with insulin pen 4 times daily Dx E11.6  .  lisinopril (ZESTRIL) 20 MG tablet TAKE 1 TABLET BY MOUTH TWICE A DAY  . lisinopril (ZESTRIL) 20 MG tablet Take 1 tablet (20 mg total) by mouth 2 (two) times daily.  . metoprolol succinate (TOPROL-XL) 100 MG 24 hr tablet Take 1 tablet (100 mg total) by mouth daily.  . Multiple Vitamins-Minerals (A[place]) CHEW Chew 1 tablet by mouth as needed.   . nitroGLYCERIN (NITROSTAT) 0.4 MG SL tablet Place under the tongue.  . SitaGLIPtin-MetFORMIN HCl (JANUMET XR) 50-1000 MG TB24 Take 1 tablet by mouth daily. with food   No facility-administered encounter medications on file as of 06/08/2020.    Past Surgical History:  Procedure Laterality Date  . [place]    x 1  . EYE SURGERY     weak muscle eye surgery as child  . HYSTEROSCOPY WITH D & C  09/20/2011   Procedure: DILATATION AND CURETTAGE /HYSTEROSCOPY;  Surgeon: Allyn Kenner, DO;  Location: [facility];  Service: Gynecology;  Laterality: N/A;  colposcopy  . LITHOTRIPSY     kidney stone  . REFRACTIVE SURGERY     broken blood vessels behind eyes - bilaterally  . right side wisdom teeth ext     still has left wisdom teeth top/botom  . TOOTH EXTRACTION     upper and lower back right    Family History  Problem Relation Age of Onset  . Diabetes Mother   . Hypertension Mother   . Atrial fibrillation Mother   . Heart attack Father   . Diabetes Sister     New complaints: None today  Social history: Lives with her son and daughter in law  Controlled substance contract: n/a    Review of Systems  Constitutional: Negative for diaphoresis.  Eyes: Negative for pain.  Respiratory: Negative for shortness of breath.   Cardiovascular: Negative for chest pain, palpitations and leg swelling.  Gastrointestinal: Negative for abdominal pain.  Endocrine: Negative for polydipsia.  Musculoskeletal: Positive for arthralgias.  Skin: Negative for rash.  Neurological: Negative for dizziness, weakness and headaches.  Hematological: Does not bruise/bleed easily.  All other systems reviewed and are negative.      Objective:   Physical Exam Vitals and nursing note reviewed.  Constitutional:      General: She is not in acute distress.    Appearance: Normal appearance. She is well-developed.  HENT:     Head: Normocephalic.     Nose: Nose normal.  Eyes:     Pupils: Pupils are equal, round, and reactive to light.  Neck:     Vascular: No carotid bruit or JVD.  Cardiovascular:     Rate and Rhythm: Normal rate and regular rhythm.     Heart sounds: Normal heart sounds.  Pulmonary:     Effort: Pulmonary effort is normal. No respiratory distress.     Breath sounds: Normal breath sounds. No wheezing  or rales.  Chest:     Chest wall: No tenderness.  Abdominal:     General: Bowel sounds are normal. There is no distension or abdominal bruit.     Palpations: Abdomen is soft. There is no hepatomegaly, splenomegaly, mass or pulsatile mass.     Tenderness: There is no abdominal tenderness.  Musculoskeletal:        General: Normal range of motion.     Cervical back: Normal range of motion and neck supple.  Lymphadenopathy:     Cervical: No cervical adenopathy.  Skin:    General: Skin is warm and dry.  Neurological:     Mental Status: She is alert and oriented to person, place, and time.     Deep Tendon Reflexes: Reflexes are normal and symmetric.  Psychiatric:        Behavior: Behavior normal.        Thought Content: Thought content normal.        Judgment: Judgment normal.    BP (!) 169/75   Pulse 71   Temp (!) 97.5 F (36.4 C) (Temporal)   Resp 20   Ht '5\' 8"'  (1.727 m)   Wt 271 lb (122.9 kg)   SpO2 97%   BMI 41.21 kg/m   Hgba1c 13.2%      Assessment & Plan:  Emma Griffin comes in today with chief complaint of Medical Management of Chronic Issues   Diagnosis and orders addressed:  1. Primary hypertension Low sodium diet - CBC with Differential/Platelet - CMP14+EGFR  2. Mixed hyperlipidemia Low ft diet - Lipid panel  3. Type 2 diabetes mellitus with hyperglycemia, with long-term current use of insulin (HCC) Stricter carb counting Increased tresiba to 160u daily humalog 40 with each meal if blood sugar is greater then 200 - Bayer DCA Hb A1c Waived - Microalbumin / creatinine urine ratio - SitaGLIPtin-MetFORMIN HCl (JANUMET XR) 50-1000 MG TB24; Take 1 tablet by mouth daily. with food  Dispense: 90 tablet; Refill: 0 - insulin degludec (TRESIBA FLEXTOUCH) 200 UNIT/ML FlexTouch Pen; Inject 160 Units into the skin daily.  Dispense: 45 mL; Refill: 0 - insulin lispro (HUMALOG KWIKPEN) 100 UNIT/ML KwikPen; If blood sugar prior to each meal  is greater then 200 give  yourself 40u.  Dispense: 15 mL; Refill: 1  4. Gastroesophageal reflux disease without esophagitis Avoid spicy foods Do not eat 2 hours prior to bedtime  5. Peripheral edema Elevate legs when siting - furosemide (LASIX) 40 MG tablet; Take 1 tablet (40 mg total) by mouth daily.  Dispense: 90 tablet; Refill: 0  6. Morbid obesity ([place]) Discussed diet and exercise for person with BMI >25 Will recheck weight in 3-6 months  7. Hyperlipidemia, unspecified hyperlipidemia type Low fat diet - atorvastatin (LIPITOR) 80 MG tablet; Take 1 tablet (80 mg total) by mouth daily at 6 PM.  Dispense: 90 tablet; Refill: 0   Labs pending Health Maintenance reviewed Diet and exercise encouraged  Follow up plan: 1 month   Sharon, [name]

## 2020-06-09 ENCOUNTER — Other Ambulatory Visit: Payer: Self-pay | Admitting: Nurse Practitioner

## 2020-06-09 DIAGNOSIS — I10 Essential (primary) hypertension: Secondary | ICD-10-CM

## 2020-06-09 LAB — LIPID PANEL
Chol/HDL Ratio: 4.1 ratio (ref 0.0–4.4)
Cholesterol, Total: 122 mg/dL (ref 100–199)
HDL: 30 mg/dL — ABNORMAL LOW (ref >39)
LDL Chol Calc (NIH): 39 mg/dL (ref 0–99)
Triglycerides: 362 mg/dL — ABNORMAL HIGH (ref 0–149)
VLDL Cholesterol Cal: 53 mg/dL — ABNORMAL HIGH (ref 5–40)

## 2020-06-09 LAB — CBC WITH DIFFERENTIAL/PLATELET
Basophils Absolute: 0.1 10*3/uL (ref 0.0–0.2)
Basos: 1 %
EOS (ABSOLUTE): 0.3 10*3/uL (ref 0.0–0.4)
Eos: 3 %
Hematocrit: 41.6 % (ref 34.0–46.6)
Hemoglobin: 13.2 g/dL (ref 11.1–15.9)
Immature Grans (Abs): 0.1 10*3/uL (ref 0.0–0.1)
Immature Granulocytes: 1 %
Lymphocytes Absolute: 2.5 10*3/uL (ref 0.7–3.1)
Lymphs: 24 %
MCH: 25.6 pg — ABNORMAL LOW (ref 26.6–33.0)
MCHC: 31.7 g/dL (ref 31.5–35.7)
MCV: 81 fL (ref 79–97)
Monocytes Absolute: 0.8 10*3/uL (ref 0.1–0.9)
Monocytes: 8 %
Neutrophils Absolute: 6.6 10*3/uL (ref 1.4–7.0)
Neutrophils: 63 %
Platelets: 204 10*3/uL (ref 150–450)
RBC: 5.16 x10E6/uL (ref 3.77–5.28)
RDW: 13.9 % (ref 11.7–15.4)
WBC: 10.3 10*3/uL (ref 3.4–10.8)

## 2020-06-09 LAB — CMP14+EGFR
ALT: 28 IU/L (ref 0–32)
AST: 23 IU/L (ref 0–40)
Albumin/Globulin Ratio: 1 — ABNORMAL LOW (ref 1.2–2.2)
Albumin: 3.6 g/dL — ABNORMAL LOW (ref 3.8–4.8)
Alkaline Phosphatase: 131 IU/L — ABNORMAL HIGH (ref 44–121)
BUN/Creatinine Ratio: 18 (ref 12–28)
BUN: 17 mg/dL (ref 8–27)
Bilirubin Total: 0.4 mg/dL (ref 0.0–1.2)
CO2: 23 mmol/L (ref 20–29)
Calcium: 9.5 mg/dL (ref 8.7–10.3)
Chloride: 100 mmol/L (ref 96–106)
Creatinine, Ser: 0.92 mg/dL (ref 0.57–1.00)
Globulin, Total: 3.6 g/dL (ref 1.5–4.5)
Glucose: 252 mg/dL — ABNORMAL HIGH (ref 65–99)
Potassium: 4.2 mmol/L (ref 3.5–5.2)
Sodium: 140 mmol/L (ref 134–144)
Total Protein: 7.2 g/dL (ref 6.0–8.5)
eGFR: 67 mL/min/{1.73_m2} (ref >59)

## 2020-06-09 LAB — MICROALBUMIN / CREATININE URINE RATIO
Creatinine, Urine: 16.5 mg/dL
Microalb/Creat Ratio: 41 mg/g creat — ABNORMAL HIGH (ref 0–29)
Microalbumin, Urine: 6.8 ug/mL

## 2020-06-28 ENCOUNTER — Encounter (INDEPENDENT_AMBULATORY_CARE_PROVIDER_SITE_OTHER): Payer: Medicare PPO | Admitting: Ophthalmology

## 2020-06-28 ENCOUNTER — Other Ambulatory Visit: Payer: Self-pay

## 2020-06-28 DIAGNOSIS — D3132 Benign neoplasm of left choroid: Secondary | ICD-10-CM | POA: Diagnosis not present

## 2020-06-28 DIAGNOSIS — H43813 Vitreous degeneration, bilateral: Secondary | ICD-10-CM

## 2020-06-28 DIAGNOSIS — H35033 Hypertensive retinopathy, bilateral: Secondary | ICD-10-CM | POA: Diagnosis not present

## 2020-06-28 DIAGNOSIS — I10 Essential (primary) hypertension: Secondary | ICD-10-CM

## 2020-06-28 DIAGNOSIS — E113513 Type 2 diabetes mellitus with proliferative diabetic retinopathy with macular edema, bilateral: Secondary | ICD-10-CM

## 2020-07-04 ENCOUNTER — Other Ambulatory Visit: Payer: Self-pay | Admitting: Nurse Practitioner

## 2020-07-04 DIAGNOSIS — E1136 Type 2 diabetes mellitus with diabetic cataract: Secondary | ICD-10-CM

## 2020-07-04 DIAGNOSIS — IMO0002 Reserved for concepts with insufficient information to code with codable children: Secondary | ICD-10-CM

## 2020-07-04 DIAGNOSIS — E1165 Type 2 diabetes mellitus with hyperglycemia: Secondary | ICD-10-CM

## 2020-07-06 ENCOUNTER — Other Ambulatory Visit: Payer: Self-pay

## 2020-07-06 ENCOUNTER — Ambulatory Visit: Payer: Medicare PPO | Admitting: Nurse Practitioner

## 2020-07-06 ENCOUNTER — Encounter: Payer: Self-pay | Admitting: Nurse Practitioner

## 2020-07-06 VITALS — BP 138/76 | HR 74 | Temp 97.5°F | Resp 20 | Ht 68.0 in | Wt 275.0 lb

## 2020-07-06 DIAGNOSIS — Z794 Long term (current) use of insulin: Secondary | ICD-10-CM

## 2020-07-06 DIAGNOSIS — E1165 Type 2 diabetes mellitus with hyperglycemia: Secondary | ICD-10-CM | POA: Diagnosis not present

## 2020-07-06 LAB — BAYER DCA HB A1C WAIVED: HB A1C (BAYER DCA - WAIVED): 11.5 % — ABNORMAL HIGH (ref <7.0)

## 2020-07-06 NOTE — Progress Notes (Signed)
   Subjective:    Patient ID: Emma Griffin, female    DOB: 02-03-1951, 70 y.o.   MRN: [id number]   Chief Complaint: Diabetes   HPI Patient was seen for follow up of chronic medical problems on 06/08/20. She had not been watching diet and blood sugars were running high. Her HGBa1c was 13.2. we increased tresiba to 160u daily, humalog 40u with each meal for blood sugar over 200. Carb counting encouraged. Since visit her blood sugars have been up and down. Averages closer to 200. She feels some better.     Review of Systems  Constitutional: Negative for diaphoresis.  Eyes: Negative for pain.  Respiratory: Negative for shortness of breath.   Cardiovascular: Negative for chest pain, palpitations and leg swelling.  Gastrointestinal: Negative for abdominal pain.  Endocrine: Negative for polydipsia.  Skin: Negative for rash.  Neurological: Negative for dizziness, weakness and headaches.  Hematological: Does not bruise/bleed easily.  All other systems reviewed and are negative.      Objective:   Physical Exam Vitals and nursing note reviewed.  Constitutional:      Appearance: She is obese.  Cardiovascular:     Rate and Rhythm: Normal rate and regular rhythm.     Heart sounds: Normal heart sounds.  Pulmonary:     Effort: Pulmonary effort is normal.     Breath sounds: Normal breath sounds.  Skin:    General: Skin is warm and dry.  Neurological:     General: No focal deficit present.     Mental Status: She is alert and oriented to person, place, and time.     BP 138/76   Pulse 74   Temp (!) 97.5 F (36.4 C) (Temporal)   Resp 20   Ht 5\' 8"  (1.727 m)   Wt 275 lb (124.7 kg)   SpO2 92%   BMI 41.81 kg/m   hgba1c 11.5%       Assessment & Plan:  Emma Griffin in today with chief complaint of Diabetes   1. Type 2 diabetes mellitus with hyperglycemia, with long-term current use of insulin (HCC) Continue current routine tresiba 160u daily, humalog 40 u prior to meals with  blood sugar greater the 200. watch carbs in diet - Bayer [place] Hb A1c Waived    The above assessment and management plan was discussed with the patient. The patient verbalized understanding of and has agreed to the management plan. Patient is aware to call the clinic if symptoms persist or worsen. Patient is aware when to return to the clinic for a follow-up visit. Patient educated on when it is appropriate to go to the emergency department.   Mary-Margaret Hassell Done, [name]

## 2020-07-09 ENCOUNTER — Ambulatory Visit: Payer: Self-pay | Admitting: Pharmacist

## 2020-07-26 ENCOUNTER — Other Ambulatory Visit: Payer: Self-pay

## 2020-07-26 ENCOUNTER — Encounter (INDEPENDENT_AMBULATORY_CARE_PROVIDER_SITE_OTHER): Payer: Medicare PPO | Admitting: Ophthalmology

## 2020-07-26 DIAGNOSIS — D3132 Benign neoplasm of left choroid: Secondary | ICD-10-CM | POA: Diagnosis not present

## 2020-07-26 DIAGNOSIS — I10 Essential (primary) hypertension: Secondary | ICD-10-CM

## 2020-07-26 DIAGNOSIS — E113513 Type 2 diabetes mellitus with proliferative diabetic retinopathy with macular edema, bilateral: Secondary | ICD-10-CM

## 2020-07-26 DIAGNOSIS — H35033 Hypertensive retinopathy, bilateral: Secondary | ICD-10-CM

## 2020-07-26 DIAGNOSIS — H43813 Vitreous degeneration, bilateral: Secondary | ICD-10-CM

## 2020-08-23 ENCOUNTER — Encounter (INDEPENDENT_AMBULATORY_CARE_PROVIDER_SITE_OTHER): Payer: Medicare PPO | Admitting: Ophthalmology

## 2020-08-23 ENCOUNTER — Other Ambulatory Visit: Payer: Self-pay

## 2020-08-23 DIAGNOSIS — H35033 Hypertensive retinopathy, bilateral: Secondary | ICD-10-CM | POA: Diagnosis not present

## 2020-08-23 DIAGNOSIS — H43813 Vitreous degeneration, bilateral: Secondary | ICD-10-CM

## 2020-08-23 DIAGNOSIS — D3132 Benign neoplasm of left choroid: Secondary | ICD-10-CM

## 2020-08-23 DIAGNOSIS — E113513 Type 2 diabetes mellitus with proliferative diabetic retinopathy with macular edema, bilateral: Secondary | ICD-10-CM

## 2020-08-23 DIAGNOSIS — I10 Essential (primary) hypertension: Secondary | ICD-10-CM | POA: Diagnosis not present

## 2020-09-09 ENCOUNTER — Other Ambulatory Visit: Payer: Self-pay | Admitting: Nurse Practitioner

## 2020-09-09 ENCOUNTER — Other Ambulatory Visit: Payer: Self-pay

## 2020-09-09 ENCOUNTER — Ambulatory Visit: Payer: Medicare PPO | Admitting: Nurse Practitioner

## 2020-09-09 ENCOUNTER — Encounter: Payer: Self-pay | Admitting: Nurse Practitioner

## 2020-09-09 VITALS — BP 152/88 | HR 71 | Temp 96.9°F | Resp 20 | Ht 68.0 in | Wt 283.0 lb

## 2020-09-09 DIAGNOSIS — I10 Essential (primary) hypertension: Secondary | ICD-10-CM

## 2020-09-09 DIAGNOSIS — E1165 Type 2 diabetes mellitus with hyperglycemia: Secondary | ICD-10-CM | POA: Diagnosis not present

## 2020-09-09 DIAGNOSIS — R609 Edema, unspecified: Secondary | ICD-10-CM

## 2020-09-09 DIAGNOSIS — K219 Gastro-esophageal reflux disease without esophagitis: Secondary | ICD-10-CM | POA: Diagnosis not present

## 2020-09-09 DIAGNOSIS — Z794 Long term (current) use of insulin: Secondary | ICD-10-CM

## 2020-09-09 DIAGNOSIS — E782 Mixed hyperlipidemia: Secondary | ICD-10-CM | POA: Diagnosis not present

## 2020-09-09 DIAGNOSIS — E785 Hyperlipidemia, unspecified: Secondary | ICD-10-CM

## 2020-09-09 LAB — BAYER DCA HB A1C WAIVED: HB A1C (BAYER DCA - WAIVED): 10.8 % — ABNORMAL HIGH (ref <7.0)

## 2020-09-09 MED ORDER — METOPROLOL SUCCINATE ER 100 MG PO TB24: 100.0000 mg | ORAL_TABLET | Freq: Every day | ORAL | 1 refills | Status: DC

## 2020-09-09 MED ORDER — TRESIBA FLEXTOUCH 200 UNIT/ML ~~LOC~~ SOPN: 160.0000 [IU] | PEN_INJECTOR | Freq: Every day | SUBCUTANEOUS | 1 refills | Status: DC

## 2020-09-09 MED ORDER — JANUMET XR 50-1000 MG PO TB24: 1.0000 | ORAL_TABLET | Freq: Every day | ORAL | 1 refills | Status: DC

## 2020-09-09 MED ORDER — FUROSEMIDE 40 MG PO TABS: 40.0000 mg | ORAL_TABLET | Freq: Every day | ORAL | 1 refills | Status: DC

## 2020-09-09 MED ORDER — LISINOPRIL 20 MG PO TABS: 20.0000 mg | ORAL_TABLET | Freq: Two times a day (BID) | ORAL | 1 refills | Status: DC

## 2020-09-09 MED ORDER — ATORVASTATIN CALCIUM 80 MG PO TABS: 80.0000 mg | ORAL_TABLET | Freq: Every day | ORAL | 1 refills | Status: DC

## 2020-09-09 NOTE — Patient Instructions (Signed)
https://www.diabeteseducator.org/docs/default-source/living-with-diabetes/conquering-the-grocery-store-v1.pdf?sfvrsn=4">  Carbohydrate Counting for Diabetes Mellitus, Adult Carbohydrate counting is a method of keeping track of how many carbohydrates you eat. Eating carbohydrates naturally increases the amount of sugar (glucose) in the blood. Counting how many carbohydrates you eat improves your bloodglucose control, which helps you manage your diabetes. It is important to know how many carbohydrates you can safely have in each meal. This is different for every person. A dietitian can help you make a meal plan and calculate how many carbohydrates you should have at each meal andsnack. What foods contain carbohydrates? Carbohydrates are found in the following foods: Grains, such as breads and cereals. Dried beans and soy products. Starchy vegetables, such as potatoes, peas, and corn. Fruit and fruit juices. Milk and yogurt. Sweets and snack foods, such as cake, cookies, candy, chips, and soft drinks. How do I count carbohydrates in foods? There are two ways to count carbohydrates in food. You can read food labels or learn standard serving sizes of foods. You can use either of the methods or acombination of both. Using the Nutrition Facts label The Nutrition Facts list is included on the labels of almost all packaged foods and beverages in the [place] It includes: The serving size. Information about nutrients in each serving, including the grams (g) of carbohydrate per serving. To use the Nutrition Facts: Decide how many servings you will have. Multiply the number of servings by the number of carbohydrates per serving. The resulting number is the total amount of carbohydrates that you will be having. Learning the standard serving sizes of foods When you eat carbohydrate foods that are not packaged or do not include Nutrition Facts on the label, you need to measure the servings in order to count the  amount of carbohydrates. Measure the foods that you will eat with a food scale or measuring cup, if needed. Decide how many standard-size servings you will eat. Multiply the number of servings by 15. For foods that contain carbohydrates, one serving equals 15 g of carbohydrates. For example, if you eat 2 cups or 10 oz (300 g) of strawberries, you will have eaten 2 servings and 30 g of carbohydrates (2 servings x 15 g = 30 g). For foods that have more than one food mixed, such as soups and casseroles, you must count the carbohydrates in each food that is included. The following list contains standard serving sizes of common carbohydrate-rich foods. Each of these servings has about 15 g of carbohydrates: 1 slice of bread. 1 six-inch (15 cm) tortilla. ? cup or 2 oz (53 g) cooked rice or pasta.  cup or 3 oz (85 g) cooked or canned, drained and rinsed beans or lentils.  cup or 3 oz (85 g) starchy vegetable, such as peas, corn, or squash.  cup or 4 oz (120 g) hot cereal.  cup or 3 oz (85 g) boiled or mashed potatoes, or  or 3 oz (85 g) of a large baked potato.  cup or 4 fl oz (118 mL) fruit juice. 1 cup or 8 fl oz (237 mL) milk. 1 small or 4 oz (106 g) apple.  or 2 oz (63 g) of a medium banana. 1 cup or 5 oz (150 g) strawberries. 3 cups or 1 oz (24 g) popped popcorn. What is an example of carbohydrate counting? To calculate the number of carbohydrates in this sample meal, follow the stepsshown below. Sample meal 3 oz (85 g) chicken breast. ? cup or 4 oz (106 g) brown rice.    cup or 3 oz (85 g) corn. 1 cup or 8 fl oz (237 mL) milk. 1 cup or 5 oz (150 g) strawberries with sugar-free whipped topping. Carbohydrate calculation Identify the foods that contain carbohydrates: Rice. Corn. Milk. Strawberries. Calculate how many servings you have of each food: 2 servings rice. 1 serving corn. 1 serving milk. 1 serving strawberries. Multiply each number of servings by 15 g: 2 servings  rice x 15 g = 30 g. 1 serving corn x 15 g = 15 g. 1 serving milk x 15 g = 15 g. 1 serving strawberries x 15 g = 15 g. Add together all of the amounts to find the total grams of carbohydrates eaten: 30 g + 15 g + 15 g + 15 g = 75 g of carbohydrates total. What are tips for following this plan? Shopping Develop a meal plan and then make a shopping list. Buy fresh and frozen vegetables, fresh and frozen fruit, dairy, eggs, beans, lentils, and whole grains. Look at food labels. Choose foods that have more fiber and less sugar. Avoid processed foods and foods with added sugars. Meal planning Aim to have the same amount of carbohydrates at each meal and for each snack time. Plan to have regular, balanced meals and snacks. Where to find more information American Diabetes Association: www.diabetes.org Centers for Disease Control and Prevention: www.cdc.gov Summary Carbohydrate counting is a method of keeping track of how many carbohydrates you eat. Eating carbohydrates naturally increases the amount of sugar (glucose) in the blood. Counting how many carbohydrates you eat improves your blood glucose control, which helps you manage your diabetes. A dietitian can help you make a meal plan and calculate how many carbohydrates you should have at each meal and snack. This information is not intended to replace advice given to you by your health care provider. Make sure you discuss any questions you have with your healthcare provider. Document Revised: 02/20/2019 Document Reviewed: 02/21/2019 Elsevier Patient Education  2021 [employer]  

## 2020-09-09 NOTE — Progress Notes (Signed)
Subjective:    Patient ID: Emma Griffin, female    DOB: Feb 26, 1951, 70 y.o.   MRN: [id number]  Chief Complaint: medical management of chronic issues     HPI:  1. Primary hypertension No c/o chest pin or headache. Does not check blood pressure at home. BP Readings from Last 3 Encounters:  09/09/20 (!) 175/86  07/06/20 138/76  06/08/20 (!) 169/75     2. Mixed hyperlipidemia Does not watch diet and does no exercise. Lab Results  Component Value Date   CHOL 122 06/08/2020   HDL 30 (L) 06/08/2020   LDLCALC 39 06/08/2020   TRIG 362 (H) 06/08/2020   CHOLHDL 4.1 06/08/2020     3. Type 2 diabetes mellitus with hyperglycemia, with long-term current use of insulin (HCC) Fasting blood sugars are up and down depending on what she eats. Sh eis not really watching it closely. She is on tresiba 160u dialy and using humaog that she has at home 40u 2-3 x a day prior to meals. Lab Results  Component Value Date   HGBA1C 11.5 (H) 07/06/2020     4. Gastroesophageal reflux disease without esophagitis Is currently not on anything right now.  5. Peripheral edema Has daily. Resolves some at night , but never completely goes down. She is on lasix 11m daily.  6. Morbid obesity ([place] Weight is up 8 lbs Wt Readings from Last 3 Encounters:  09/09/20 283 lb (128.4 kg)  07/06/20 275 lb (124.7 kg)  06/08/20 271 lb (122.9 kg)   BMI Readings from Last 3 Encounters:  09/09/20 43.03 kg/m  07/06/20 41.81 kg/m  06/08/20 41.21 kg/m       Outpatient Encounter Medications as of 09/09/2020  Medication Sig   ACCU-CHEK GUIDE test strip TEST UP TO 4 TIMES DAILY DX E11.65   Accu-Chek Softclix Lancets lancets TEST 4 TIMES DAILY Dx E11.6   Ascorbic Acid (VITAMIN C) 1000 MG tablet Take 1,000 mg by mouth daily.   aspirin (CVS ASPIRIN ADULT LOW DOSE) 81 MG chewable tablet Chew 1 tablet (81 mg total) by mouth daily.   atorvastatin (LIPITOR) 80 MG tablet TAKE 1 TABLET BY MOUTH DAILY AT 6 PM.    bevacizumab (AVASTIN) 1.25 mg/0.1 mL SOLN Apply 1.25 mg to eye See admin instructions. Opthalmic injection every 4-6 weeks   Calcium Carbonate-Vitamin D (CVS CALCIUM/VIT D SOFT CHEWS PO) Take 1 tablet by mouth daily.    Continuous Blood Gluc Receiver (FREESTYLE LIBRE 2 READER) DEVI Use to test blood sugars 6 times daily as directed. DX: E11.65   Continuous Blood Gluc Sensor (FREESTYLE LIBRE 2 SENSOR) MISC Use to test blood sugars 6 times daily as directed. DX: E11.65   furosemide (LASIX) 40 MG tablet TAKE 1 TABLET BY MOUTH EVERY DAY   gatifloxacin (ZYMAR) 0.3 % ophthalmic drops Place 1 drop into the right eye 4 (four) times daily. Used for 2 days after surgery   ibuprofen (ADVIL,MOTRIN) 200 MG tablet Take 400-600 mg by mouth every 6 (six) hours as needed. For pain/cramps   insulin aspart (NOVOLOG) 100 UNIT/ML FlexPen Check blood sugars if greater then 200- 40u of insulin prior to breakfast , linch and dinner   insulin degludec (TRESIBA FLEXTOUCH) 200 UNIT/ML FlexTouch Pen Inject 160 Units into the skin daily.   Insulin Pen Needle (NOVOFINE) 32G X 6 MM MISC Use with insulin pen 4 times daily Dx E11.6   lisinopril (ZESTRIL) 20 MG tablet Take 1 tablet (20 mg total) by mouth 2 (two) times daily.  metoprolol succinate (TOPROL-XL) 100 MG 24 hr tablet TAKE 1 TABLET BY MOUTH EVERY DAY   Multiple Vitamins-Minerals (AIRBORNE) CHEW Chew 1 tablet by mouth as needed.    nitroGLYCERIN (NITROSTAT) 0.4 MG SL tablet Place under the tongue.   SitaGLIPtin-MetFORMIN HCl (JANUMET XR) 50-1000 MG TB24 Take 1 tablet by mouth daily. with food   No facility-administered encounter medications on file as of 09/09/2020.    Past Surgical History:  Procedure Laterality Date   CESAREAN SECTION  1987   x 1   EYE SURGERY     weak muscle eye surgery as child   HYSTEROSCOPY WITH D & C  09/20/2011   Procedure: DILATATION AND CURETTAGE /HYSTEROSCOPY;  Surgeon: Allyn Kenner, DO;  Location: [facility];  Service: Gynecology;   Laterality: N/A;  colposcopy   LITHOTRIPSY     kidney stone   REFRACTIVE SURGERY     broken blood vessels behind eyes - bilaterally   right side wisdom teeth ext     still has left wisdom teeth top/botom   TOOTH EXTRACTION     upper and lower back right    Family History  Problem Relation Age of Onset   Diabetes Mother    Hypertension Mother    Atrial fibrillation Mother    Heart attack Father    Diabetes Sister     New complaints: None today  Social history: Lives with her son  Controlled substance contract: n/a     Review of Systems  Constitutional:  Negative for diaphoresis.  Eyes:  Negative for pain.  Respiratory:  Negative for shortness of breath.   Cardiovascular:  Negative for chest pain, palpitations and leg swelling.  Gastrointestinal:  Negative for abdominal pain.  Endocrine: Negative for polydipsia.  Skin:  Negative for rash.  Neurological:  Negative for dizziness, weakness and headaches.  Hematological:  Does not bruise/bleed easily.  All other systems reviewed and are negative.     Objective:   Physical Exam Vitals and nursing note reviewed.  Constitutional:      General: She is not in acute distress.    Appearance: Normal appearance. She is well-developed.  HENT:     Head: Normocephalic.     Right Ear: Tympanic membrane normal.     Left Ear: Tympanic membrane normal.     Nose: Nose normal.     Mouth/Throat:     Mouth: Mucous membranes are moist.  Eyes:     Pupils: Pupils are equal, round, and reactive to light.  Neck:     Vascular: No carotid bruit or JVD.  Cardiovascular:     Rate and Rhythm: Normal rate and regular rhythm.     Heart sounds: Normal heart sounds.  Pulmonary:     Effort: Pulmonary effort is normal. No respiratory distress.     Breath sounds: Normal breath sounds. No wheezing or rales.  Chest:     Chest wall: No tenderness.  Abdominal:     General: Bowel sounds are normal. There is no distension or abdominal bruit.      Palpations: Abdomen is soft. There is no hepatomegaly, splenomegaly, mass or pulsatile mass.     Tenderness: There is no abdominal tenderness.  Musculoskeletal:        General: Normal range of motion.     Cervical back: Normal range of motion and neck supple.     Right lower leg: Edema (2+) present.     Left lower leg: Edema (2+) present.  Lymphadenopathy:     Cervical:  No cervical adenopathy.  Skin:    General: Skin is warm and dry.  Neurological:     Mental Status: She is alert and oriented to person, place, and time.     Deep Tendon Reflexes: Reflexes are normal and symmetric.  Psychiatric:        Behavior: Behavior normal.        Thought Content: Thought content normal.        Judgment: Judgment normal.    BP (!) 152/88   Pulse 71   Temp (!) 96.9 F (36.1 C) (Temporal)   Resp 20   Ht _0  (1.727 m)   Wt 283 lb (128.4 kg)   SpO2 90%   BMI 43.03 kg/m   HGBA1c 10.8      Assessment & Plan:   Emma Griffin comes in today with chief complaint of Medical Management of Chronic Issues   Diagnosis and orders addressed:  1. Primary hypertension Low sodium diet - metoprolol succinate (TOPROL-XL) 100 MG 24 hr tablet; Take 1 tablet (100 mg total) by mouth daily. Take with or immediately following a meal.  Dispense: 90 tablet; Refill: 1 - lisinopril (ZESTRIL) 20 MG tablet; Take 1 tablet (20 mg total) by mouth 2 (two) times daily.  Dispense: 180 tablet; Refill: 1 - CBC with Differential/Platelet - CMP14+EGFR  2. Mixed hyperlipidemia Low fat diet - atorvastatin (LIPITOR) 80 MG tablet; Take 1 tablet (80 mg total) by mouth daily.  Dispense: 90 tablet; Refill: 1 - Lipid panel  3. Type 2 diabetes mellitus with hyperglycemia, with long-term current use of insulin (HCC) Stricter carb counting Make sure do humalog 40 3x a day - insulin degludec (TRESIBA FLEXTOUCH) 200 UNIT/ML FlexTouch Pen; Inject 160 Units into the skin daily.  Dispense: 45 mL; Refill: 1 -  SitaGLIPtin-MetFORMIN HCl (JANUMET XR) 50-1000 MG TB24; Take 1 tablet by mouth daily. with food  Dispense: 90 tablet; Refill: 1 - Bayer DCA Hb A1c Waived  4. Gastroesophageal reflux disease without esophagitis Avoid spicy foods Do not eat 2 hours prior to bedtime  5. Peripheral edema Elevate legs when sitting - furosemide (LASIX) 40 MG tablet; Take 1 tablet (40 mg total) by mouth daily.  Dispense: 90 tablet; Refill: 1  6. Morbid obesity ([place]) Discussed diet and exercise for person with BMI >25 Will recheck weight in 3-6 months    Labs pending Health Maintenance reviewed Diet and exercise encouraged  Follow up plan: 3 months   Mary-Margaret Hassell Done, [name]

## 2020-09-10 LAB — CBC WITH DIFFERENTIAL/PLATELET
Basophils Absolute: 0.1 10*3/uL (ref 0.0–0.2)
Basos: 1 %
EOS (ABSOLUTE): 0.4 10*3/uL (ref 0.0–0.4)
Eos: 3 %
Hematocrit: 42.1 % (ref 34.0–46.6)
Hemoglobin: 13.2 g/dL (ref 11.1–15.9)
Immature Grans (Abs): 0.1 10*3/uL (ref 0.0–0.1)
Immature Granulocytes: 1 %
Lymphocytes Absolute: 3 10*3/uL (ref 0.7–3.1)
Lymphs: 25 %
MCH: 25 pg — ABNORMAL LOW (ref 26.6–33.0)
MCHC: 31.4 g/dL — ABNORMAL LOW (ref 31.5–35.7)
MCV: 80 fL (ref 79–97)
Monocytes Absolute: 0.9 10*3/uL (ref 0.1–0.9)
Monocytes: 7 %
Neutrophils Absolute: 7.6 10*3/uL — ABNORMAL HIGH (ref 1.4–7.0)
Neutrophils: 63 %
Platelets: 224 10*3/uL (ref 150–450)
RBC: 5.28 x10E6/uL (ref 3.77–5.28)
RDW: 14.5 % (ref 11.7–15.4)
WBC: 12.1 10*3/uL — ABNORMAL HIGH (ref 3.4–10.8)

## 2020-09-10 LAB — CMP14+EGFR
ALT: 55 IU/L — ABNORMAL HIGH (ref 0–32)
AST: 39 IU/L (ref 0–40)
Albumin/Globulin Ratio: 1.1 — ABNORMAL LOW (ref 1.2–2.2)
Albumin: 4.1 g/dL (ref 3.8–4.8)
Alkaline Phosphatase: 146 IU/L — ABNORMAL HIGH (ref 44–121)
BUN/Creatinine Ratio: 17 (ref 12–28)
BUN: 17 mg/dL (ref 8–27)
Bilirubin Total: 0.5 mg/dL (ref 0.0–1.2)
CO2: 25 mmol/L (ref 20–29)
Calcium: 9.3 mg/dL (ref 8.7–10.3)
Chloride: 98 mmol/L (ref 96–106)
Creatinine, Ser: 0.99 mg/dL (ref 0.57–1.00)
Globulin, Total: 3.6 g/dL (ref 1.5–4.5)
Glucose: 201 mg/dL — ABNORMAL HIGH (ref 65–99)
Potassium: 4 mmol/L (ref 3.5–5.2)
Sodium: 138 mmol/L (ref 134–144)
Total Protein: 7.7 g/dL (ref 6.0–8.5)
eGFR: 61 mL/min/{1.73_m2} (ref >59)

## 2020-09-10 LAB — LIPID PANEL
Chol/HDL Ratio: 4.7 ratio — ABNORMAL HIGH (ref 0.0–4.4)
Cholesterol, Total: 145 mg/dL (ref 100–199)
HDL: 31 mg/dL — ABNORMAL LOW (ref >39)
LDL Chol Calc (NIH): 59 mg/dL (ref 0–99)
Triglycerides: 354 mg/dL — ABNORMAL HIGH (ref 0–149)
VLDL Cholesterol Cal: 55 mg/dL — ABNORMAL HIGH (ref 5–40)

## 2020-09-20 ENCOUNTER — Other Ambulatory Visit: Payer: Self-pay

## 2020-09-20 ENCOUNTER — Encounter (INDEPENDENT_AMBULATORY_CARE_PROVIDER_SITE_OTHER): Payer: Medicare PPO | Admitting: Ophthalmology

## 2020-09-20 DIAGNOSIS — H43813 Vitreous degeneration, bilateral: Secondary | ICD-10-CM

## 2020-09-20 DIAGNOSIS — I10 Essential (primary) hypertension: Secondary | ICD-10-CM | POA: Diagnosis not present

## 2020-09-20 DIAGNOSIS — E113513 Type 2 diabetes mellitus with proliferative diabetic retinopathy with macular edema, bilateral: Secondary | ICD-10-CM

## 2020-09-20 DIAGNOSIS — D3132 Benign neoplasm of left choroid: Secondary | ICD-10-CM

## 2020-09-20 DIAGNOSIS — H35033 Hypertensive retinopathy, bilateral: Secondary | ICD-10-CM | POA: Diagnosis not present

## 2020-10-02 ENCOUNTER — Other Ambulatory Visit: Payer: Self-pay | Admitting: Nurse Practitioner

## 2020-10-02 DIAGNOSIS — E1165 Type 2 diabetes mellitus with hyperglycemia: Secondary | ICD-10-CM

## 2020-10-02 DIAGNOSIS — Z794 Long term (current) use of insulin: Secondary | ICD-10-CM

## 2020-10-25 ENCOUNTER — Encounter (INDEPENDENT_AMBULATORY_CARE_PROVIDER_SITE_OTHER): Payer: Medicare PPO | Admitting: Ophthalmology

## 2020-10-25 ENCOUNTER — Other Ambulatory Visit: Payer: Self-pay

## 2020-10-25 DIAGNOSIS — D3132 Benign neoplasm of left choroid: Secondary | ICD-10-CM | POA: Diagnosis not present

## 2020-10-25 DIAGNOSIS — E113513 Type 2 diabetes mellitus with proliferative diabetic retinopathy with macular edema, bilateral: Secondary | ICD-10-CM | POA: Diagnosis not present

## 2020-10-25 DIAGNOSIS — H35033 Hypertensive retinopathy, bilateral: Secondary | ICD-10-CM | POA: Diagnosis not present

## 2020-10-25 DIAGNOSIS — I10 Essential (primary) hypertension: Secondary | ICD-10-CM

## 2020-10-25 DIAGNOSIS — H43813 Vitreous degeneration, bilateral: Secondary | ICD-10-CM

## 2020-11-26 ENCOUNTER — Encounter (INDEPENDENT_AMBULATORY_CARE_PROVIDER_SITE_OTHER): Payer: Medicare PPO | Admitting: Ophthalmology

## 2020-11-26 ENCOUNTER — Other Ambulatory Visit: Payer: Self-pay

## 2020-11-26 DIAGNOSIS — I10 Essential (primary) hypertension: Secondary | ICD-10-CM | POA: Diagnosis not present

## 2020-11-26 DIAGNOSIS — H35033 Hypertensive retinopathy, bilateral: Secondary | ICD-10-CM

## 2020-11-26 DIAGNOSIS — E113513 Type 2 diabetes mellitus with proliferative diabetic retinopathy with macular edema, bilateral: Secondary | ICD-10-CM

## 2020-11-26 DIAGNOSIS — H43813 Vitreous degeneration, bilateral: Secondary | ICD-10-CM

## 2020-12-20 ENCOUNTER — Encounter: Payer: Self-pay | Admitting: Nurse Practitioner

## 2020-12-20 ENCOUNTER — Other Ambulatory Visit: Payer: Self-pay

## 2020-12-20 ENCOUNTER — Ambulatory Visit: Payer: Medicare PPO | Admitting: Nurse Practitioner

## 2020-12-20 VITALS — BP 140/76 | HR 74 | Temp 96.7°F | Resp 20 | Ht 68.0 in | Wt 286.0 lb

## 2020-12-20 DIAGNOSIS — I10 Essential (primary) hypertension: Secondary | ICD-10-CM

## 2020-12-20 DIAGNOSIS — Z794 Long term (current) use of insulin: Secondary | ICD-10-CM

## 2020-12-20 DIAGNOSIS — E1165 Type 2 diabetes mellitus with hyperglycemia: Secondary | ICD-10-CM

## 2020-12-20 DIAGNOSIS — E782 Mixed hyperlipidemia: Secondary | ICD-10-CM | POA: Diagnosis not present

## 2020-12-20 DIAGNOSIS — R600 Localized edema: Secondary | ICD-10-CM

## 2020-12-20 DIAGNOSIS — K219 Gastro-esophageal reflux disease without esophagitis: Secondary | ICD-10-CM

## 2020-12-20 DIAGNOSIS — R609 Edema, unspecified: Secondary | ICD-10-CM

## 2020-12-20 MED ORDER — LISINOPRIL 20 MG PO TABS: 20.0000 mg | ORAL_TABLET | Freq: Two times a day (BID) | ORAL | 1 refills | Status: DC

## 2020-12-20 MED ORDER — ATORVASTATIN CALCIUM 80 MG PO TABS: 80.0000 mg | ORAL_TABLET | Freq: Every day | ORAL | 1 refills | Status: DC

## 2020-12-20 MED ORDER — METOPROLOL SUCCINATE ER 100 MG PO TB24: 100.0000 mg | ORAL_TABLET | Freq: Every day | ORAL | 1 refills | Status: DC

## 2020-12-20 MED ORDER — JANUMET XR 50-1000 MG PO TB24: 1.0000 | ORAL_TABLET | Freq: Every day | ORAL | 1 refills | Status: DC

## 2020-12-20 MED ORDER — FUROSEMIDE 40 MG PO TABS: 40.0000 mg | ORAL_TABLET | Freq: Every day | ORAL | 1 refills | Status: DC

## 2020-12-20 MED ORDER — ASPIRIN 81 MG PO CHEW: 81.0000 mg | CHEWABLE_TABLET | Freq: Every day | ORAL | 1 refills | Status: DC

## 2020-12-20 MED ORDER — TRESIBA FLEXTOUCH 200 UNIT/ML ~~LOC~~ SOPN: 150.0000 [IU] | PEN_INJECTOR | Freq: Two times a day (BID) | SUBCUTANEOUS | 3 refills | Status: AC

## 2020-12-20 NOTE — Progress Notes (Signed)
``  Subjective:    Patient ID: Emma Griffin, female    DOB: 06/16/1950, 70 y.o.   MRN: [id number]   Chief Complaint: medical management of chronic issues     HPI:  1. Primary hypertension No c/o chest pain, sob or headache. Does not check blood pressure at home. BP Readings from Last 3 Encounters:  12/20/20 (!) 151/76  09/09/20 (!) 152/88  07/06/20 138/76      2. Mixed hyperlipidemia Does try to watch diet, does little to no exercise Lab Results  Component Value Date   CHOL 145 09/09/2020   HDL 31 (L) 09/09/2020   LDLCALC 59 09/09/2020   TRIG 354 (H) 09/09/2020   CHOLHDL 4.7 (H) 09/09/2020     3. Type 2 diabetes mellitus with hyperglycemia, with long-term current use of insulin (HCC) Blood sugars have gone from 200-300. Does not watch her diet at all . Last visit she wastoldto make sure she does her humalog 40u TID. Lab Results  Component Value Date   HGBA1C 10.8 (H) 09/09/2020     4. Gastroesophageal reflux disease without esophagitis Is on no prescription meds  5. Peripheral edema Has edema in bil lower ext daily. Has some sores on lower ext that are taking along time to heal.  6. Morbid obesity (HCC) Weight is up 3lbs since last visit Wt Readings from Last 3 Encounters:  12/20/20 286 lb (129.7 kg)  09/09/20 283 lb (128.4 kg)  07/06/20 275 lb (124.7 kg)   BMI Readings from Last 3 Encounters:  12/20/20 43.49 kg/m  09/09/20 43.03 kg/m  07/06/20 41.81 kg/m        Outpatient Encounter Medications as of 12/20/2020  Medication Sig   ACCU-CHEK GUIDE test strip TEST UP TO 4 TIMES DAILY DX E11.65   Accu-Chek Softclix Lancets lancets TEST 4 TIMES DAILY Dx E11.6   Ascorbic Acid (VITAMIN C) 1000 MG tablet Take 1,000 mg by mouth daily.   aspirin (CVS ASPIRIN ADULT LOW DOSE) 81 MG chewable tablet Chew 1 tablet (81 mg total) by mouth daily.   atorvastatin (LIPITOR) 80 MG tablet Take 1 tablet (80 mg total) by mouth daily.   bevacizumab (AVASTIN) 1.25 mg/0.1  mL SOLN Apply 1.25 mg to eye See admin instructions. Opthalmic injection every 4-6 weeks   Calcium Carbonate-Vitamin D (CVS CALCIUM/VIT D SOFT CHEWS PO) Take 1 tablet by mouth daily.    Continuous Blood Gluc Receiver (FREESTYLE LIBRE 2 READER) DEVI Use to test blood sugars 6 times daily as directed. DX: E11.65   Continuous Blood Gluc Sensor (FREESTYLE LIBRE 2 SENSOR) MISC Use to test blood sugars 6 times daily as directed. DX: E11.65   furosemide (LASIX) 40 MG tablet Take 1 tablet (40 mg total) by mouth daily.   ibuprofen (ADVIL,MOTRIN) 200 MG tablet Take 400-600 mg by mouth every 6 (six) hours as needed. For pain/cramps   insulin aspart (NOVOLOG) 100 UNIT/ML FlexPen Check blood sugars if greater then 200- 40u of insulin prior to breakfast , linch and dinner (Patient not taking: Reported on 09/09/2020)   insulin degludec (TRESIBA FLEXTOUCH) 200 UNIT/ML FlexTouch Pen Inject 150 Units into the skin 2 (two) times daily.   Insulin Pen Needle (NOVOFINE) 32G X 6 MM MISC Use with insulin pen 4 times daily Dx E11.6   lisinopril (ZESTRIL) 20 MG tablet Take 1 tablet (20 mg total) by mouth 2 (two) times daily.   metoprolol succinate (TOPROL-XL) 100 MG 24 hr tablet Take 1 tablet (100 mg total) by mouth daily. Take   with or immediately following a meal.   Multiple Vitamins-Minerals (AIRBORNE) CHEW Chew 1 tablet by mouth as needed.    nitroGLYCERIN (NITROSTAT) 0.4 MG SL tablet Place under the tongue.   SitaGLIPtin-MetFORMIN HCl (JANUMET XR) 50-1000 MG TB24 Take 1 tablet by mouth daily. with food   No facility-administered encounter medications on file as of 12/20/2020.    Past Surgical History:  Procedure Laterality Date   CESAREAN SECTION  1987   x 1   EYE SURGERY     weak muscle eye surgery as child   HYSTEROSCOPY WITH D & C  09/20/2011   Procedure: DILATATION AND CURETTAGE /HYSTEROSCOPY;  Surgeon: Allyn Kenner, DO;  Location: [facility];  Service: Gynecology;  Laterality: N/A;  colposcopy   LITHOTRIPSY      kidney stone   REFRACTIVE SURGERY     broken blood vessels behind eyes - bilaterally   right side wisdom teeth ext     still has left wisdom teeth top/botom   TOOTH EXTRACTION     upper and lower back right    Family History  Problem Relation Age of Onset   Diabetes Mother    Hypertension Mother    Atrial fibrillation Mother    Heart attack Father    Diabetes Sister     New complaints: None today  Social history: Lives with her son and daughter in law  Controlled substance contract: n/a     Review of Systems  Constitutional:  Negative for diaphoresis.  Eyes:  Negative for pain.  Respiratory:  Negative for shortness of breath.   Cardiovascular:  Negative for chest pain, palpitations and leg swelling.  Gastrointestinal:  Negative for abdominal pain.  Endocrine: Negative for polydipsia.  Skin:  Negative for rash.  Neurological:  Negative for dizziness, weakness and headaches.  Hematological:  Does not bruise/bleed easily.  All other systems reviewed and are negative.     Objective:   Physical Exam Vitals and nursing note reviewed.  Constitutional:      General: She is not in acute distress.    Appearance: Normal appearance. She is well-developed.  HENT:     Head: Normocephalic.     Right Ear: Tympanic membrane normal.     Left Ear: Tympanic membrane normal.     Nose: Nose normal.     Mouth/Throat:     Mouth: Mucous membranes are moist.  Eyes:     Pupils: Pupils are equal, round, and reactive to light.  Neck:     Vascular: No carotid bruit or JVD.  Cardiovascular:     Rate and Rhythm: Normal rate and regular rhythm.     Heart sounds: Normal heart sounds.  Pulmonary:     Effort: Pulmonary effort is normal. No respiratory distress.     Breath sounds: Normal breath sounds. No wheezing or rales.  Chest:     Chest wall: No tenderness.  Abdominal:     General: Bowel sounds are normal. There is no distension or abdominal bruit.     Palpations: Abdomen is  soft. There is no hepatomegaly, splenomegaly, mass or pulsatile mass.     Tenderness: There is no abdominal tenderness.  Musculoskeletal:        General: Normal range of motion.     Cervical back: Normal range of motion and neck supple.     Right lower leg: Edema (2+) present.     Left lower leg: Edema (2+) present.  Lymphadenopathy:     Cervical: No cervical adenopathy.  Skin:  General: Skin is warm and dry.  Neurological:     Mental Status: She is alert and oriented to person, place, and time.     Deep Tendon Reflexes: Reflexes are normal and symmetric.  Psychiatric:        Behavior: Behavior normal.        Thought Content: Thought content normal.        Judgment: Judgment normal.    BP 140/76   Pulse 74   Temp (!) 96.7 F (35.9 C) (Temporal)   Resp 20   Ht 5' 8" (1.727 m)   Wt 286 lb (129.7 kg)   SpO2 95%   BMI 43.49 kg/m     HGBA1c 12.4%     Assessment & Plan:  Saraiyah Cass comes in today with chief complaint of Medical Management of Chronic Issues   Diagnosis and orders addressed:  1. Primary hypertension Low sodium diet - CBC with Differential/Platelet - CMP14+EGFR - lisinopril (ZESTRIL) 20 MG tablet; Take 1 tablet (20 mg total) by mouth 2 (two) times daily.  Dispense: 180 tablet; Refill: 1 - metoprolol succinate (TOPROL-XL) 100 MG 24 hr tablet; Take 1 tablet (100 mg total) by mouth daily. Take with or immediately following a meal.  Dispense: 90 tablet; Refill: 1  2. Mixed hyperlipidemia Low fat diet - Lipid panel - atorvastatin (LIPITOR) 80 MG tablet; Take 1 tablet (80 mg total) by mouth daily.  Dispense: 90 tablet; Refill: 1  3. Type 2 diabetes mellitus with hyperglycemia, with long-term current use of insulin (HCC) Patient refuses medication changes She will get continuous glucose monitor in January when insurance allows Offered for her to see clinical pharmacist but patient refuses - Bayer DCA Hb A1c Waived - SitaGLIPtin-MetFORMIN HCl (JANUMET  XR) 50-1000 MG TB24; Take 1 tablet by mouth daily. with food  Dispense: 90 tablet; Refill: 1 - insulin degludec (TRESIBA FLEXTOUCH) 200 UNIT/ML FlexTouch Pen; Inject 150 Units into the skin 2 (two) times daily.  Dispense: 45 mL; Refill: 3  4. Gastroesophageal reflux disease without esophagitis Avoid spicy foods Do not eat 2 hours prior to bedtime   5. Peripheral edema Compression socks Elevate legs when sitting - furosemide (LASIX) 40 MG tablet; Take 1 tablet (40 mg total) by mouth daily.  Dispense: 90 tablet; Refill: 1  6. Morbid obesity (HCC) Discussed diet and exercise for person with BMI >25 Will recheck weight in 3-6 months    Labs pending Health Maintenance reviewed Diet and exercise encouraged  Follow up plan: 3 months   Mary-Margaret Martin, [name]    

## 2020-12-20 NOTE — Addendum Note (Signed)
Addended by: [name] on: 12/20/2020 03:27 PM   Modules accepted: Orders

## 2020-12-20 NOTE — Patient Instructions (Signed)
Carbohydrate Counting for Diabetes Mellitus, Adult Carbohydrate counting is a method of keeping track of how many carbohydrates you eat. Eating carbohydrates naturally increases the amount of sugar (glucose) in the blood. Counting how many carbohydrates you eat improves your blood glucose control, which helps you manage your diabetes. It is important to know how many carbohydrates you can safely have in each meal. This is different for every person. A dietitian can help you make a meal plan and calculate how many carbohydrates you should have at each meal and snack. What foods contain carbohydrates? Carbohydrates are found in the following foods: Grains, such as breads and cereals. Dried beans and soy products. Starchy vegetables, such as potatoes, peas, and corn. Fruit and fruit juices. Milk and yogurt. Sweets and snack foods, such as cake, cookies, candy, chips, and soft drinks. How do I count carbohydrates in foods? There are two ways to count carbohydrates in food. You can read food labels or learn standard serving sizes of foods. You can use either of the methods or a combination of both. Using the Nutrition Facts label The Nutrition Facts list is included on the labels of almost all packaged foods and beverages in the [place] It includes: The serving size. Information about nutrients in each serving, including the grams (g) of carbohydrate per serving. To use the Nutrition Facts: Decide how many servings you will have. Multiply the number of servings by the number of carbohydrates per serving. The resulting number is the total amount of carbohydrates that you will be having. Learning the standard serving sizes of foods When you eat carbohydrate foods that are not packaged or do not include Nutrition Facts on the label, you need to measure the servings in order to count the amount of carbohydrates. Measure the foods that you will eat with a food scale or measuring cup, if needed. Decide how  many standard-size servings you will eat. Multiply the number of servings by 15. For foods that contain carbohydrates, one serving equals 15 g of carbohydrates. For example, if you eat 2 cups or 10 oz (300 g) of strawberries, you will have eaten 2 servings and 30 g of carbohydrates (2 servings x 15 g = 30 g). For foods that have more than one food mixed, such as soups and casseroles, you must count the carbohydrates in each food that is included. The following list contains standard serving sizes of common carbohydrate-rich foods. Each of these servings has about 15 g of carbohydrates: 1 slice of bread. 1 six-inch (15 cm) tortilla. ? cup or 2 oz (53 g) cooked rice or pasta.  cup or 3 oz (85 g) cooked or canned, drained and rinsed beans or lentils.  cup or 3 oz (85 g) starchy vegetable, such as peas, corn, or squash.  cup or 4 oz (120 g) hot cereal.  cup or 3 oz (85 g) boiled or mashed potatoes, or  or 3 oz (85 g) of a large baked potato.  cup or 4 fl oz (118 mL) fruit juice. 1 cup or 8 fl oz (237 mL) milk. 1 small or 4 oz (106 g) apple.  or 2 oz (63 g) of a medium banana. 1 cup or 5 oz (150 g) strawberries. 3 cups or 1 oz (24 g) popped popcorn. What is an example of carbohydrate counting? To calculate the number of carbohydrates in this sample meal, follow the steps shown below. Sample meal 3 oz (85 g) chicken breast. ? cup or 4 oz (106 g) brown   rice.  cup or 3 oz (85 g) corn. 1 cup or 8 fl oz (237 mL) milk. 1 cup or 5 oz (150 g) strawberries with sugar-free whipped topping. Carbohydrate calculation Identify the foods that contain carbohydrates: Rice. Corn. Milk. Strawberries. Calculate how many servings you have of each food: 2 servings rice. 1 serving corn. 1 serving milk. 1 serving strawberries. Multiply each number of servings by 15 g: 2 servings rice x 15 g = 30 g. 1 serving corn x 15 g = 15 g. 1 serving milk x 15 g = 15 g. 1 serving strawberries x 15 g = 15  g. Add together all of the amounts to find the total grams of carbohydrates eaten: 30 g + 15 g + 15 g + 15 g = 75 g of carbohydrates total. What are tips for following this plan? Shopping Develop a meal plan and then make a shopping list. Buy fresh and frozen vegetables, fresh and frozen fruit, dairy, eggs, beans, lentils, and whole grains. Look at food labels. Choose foods that have more fiber and less sugar. Avoid processed foods and foods with added sugars. Meal planning Aim to have the same amount of carbohydrates at each meal and for each snack time. Plan to have regular, balanced meals and snacks. Where to find more information American Diabetes Association: www.diabetes.org Centers for Disease Control and Prevention: www.cdc.gov Summary Carbohydrate counting is a method of keeping track of how many carbohydrates you eat. Eating carbohydrates naturally increases the amount of sugar (glucose) in the blood. Counting how many carbohydrates you eat improves your blood glucose control, which helps you manage your diabetes. A dietitian can help you make a meal plan and calculate how many carbohydrates you should have at each meal and snack. This information is not intended to replace advice given to you by your health care provider. Make sure you discuss any questions you have with your health care provider. Document Revised: 02/20/2019 Document Reviewed: 02/21/2019 Elsevier Patient Education  2021 [employer] Inc[employer]  

## 2020-12-21 LAB — LIPID PANEL
Chol/HDL Ratio: 4.4 ratio (ref 0.0–4.4)
Cholesterol, Total: 131 mg/dL (ref 100–199)
HDL: 30 mg/dL — ABNORMAL LOW (ref >39)
LDL Chol Calc (NIH): 43 mg/dL (ref 0–99)
Triglycerides: 396 mg/dL — ABNORMAL HIGH (ref 0–149)
VLDL Cholesterol Cal: 58 mg/dL — ABNORMAL HIGH (ref 5–40)

## 2020-12-21 LAB — CMP14+EGFR
ALT: 37 IU/L — ABNORMAL HIGH (ref 0–32)
AST: 40 IU/L (ref 0–40)
Albumin/Globulin Ratio: 1.1 — ABNORMAL LOW (ref 1.2–2.2)
Albumin: 3.8 g/dL (ref 3.7–4.7)
Alkaline Phosphatase: 130 IU/L — ABNORMAL HIGH (ref 44–121)
BUN/Creatinine Ratio: 23 (ref 12–28)
BUN: 21 mg/dL (ref 8–27)
Bilirubin Total: 0.6 mg/dL (ref 0.0–1.2)
CO2: 24 mmol/L (ref 20–29)
Calcium: 9.5 mg/dL (ref 8.7–10.3)
Chloride: 99 mmol/L (ref 96–106)
Creatinine, Ser: 0.9 mg/dL (ref 0.57–1.00)
Globulin, Total: 3.6 g/dL (ref 1.5–4.5)
Glucose: 207 mg/dL — ABNORMAL HIGH (ref 70–99)
Potassium: 4.2 mmol/L (ref 3.5–5.2)
Sodium: 139 mmol/L (ref 134–144)
Total Protein: 7.4 g/dL (ref 6.0–8.5)
eGFR: 68 mL/min/{1.73_m2} (ref >59)

## 2020-12-21 LAB — CBC WITH DIFFERENTIAL/PLATELET
Basophils Absolute: 0.1 10*3/uL (ref 0.0–0.2)
Basos: 0 %
EOS (ABSOLUTE): 0.3 10*3/uL (ref 0.0–0.4)
Eos: 3 %
Hematocrit: 43.2 % (ref 34.0–46.6)
Hemoglobin: 14 g/dL (ref 11.1–15.9)
Immature Grans (Abs): 0.1 10*3/uL (ref 0.0–0.1)
Immature Granulocytes: 1 %
Lymphocytes Absolute: 3.2 10*3/uL — ABNORMAL HIGH (ref 0.7–3.1)
Lymphs: 28 %
MCH: 25.5 pg — ABNORMAL LOW (ref 26.6–33.0)
MCHC: 32.4 g/dL (ref 31.5–35.7)
MCV: 79 fL (ref 79–97)
Monocytes Absolute: 0.9 10*3/uL (ref 0.1–0.9)
Monocytes: 8 %
Neutrophils Absolute: 6.9 10*3/uL (ref 1.4–7.0)
Neutrophils: 60 %
Platelets: 245 10*3/uL (ref 150–450)
RBC: 5.49 x10E6/uL — ABNORMAL HIGH (ref 3.77–5.28)
RDW: 14.8 % (ref 11.7–15.4)
WBC: 11.4 10*3/uL — ABNORMAL HIGH (ref 3.4–10.8)

## 2020-12-21 LAB — BAYER DCA HB A1C WAIVED: HB A1C (BAYER DCA - WAIVED): 12.4 % — ABNORMAL HIGH (ref 4.8–5.6)

## 2021-01-03 ENCOUNTER — Other Ambulatory Visit: Payer: Self-pay

## 2021-01-03 ENCOUNTER — Encounter (INDEPENDENT_AMBULATORY_CARE_PROVIDER_SITE_OTHER): Payer: Medicare PPO | Admitting: Ophthalmology

## 2021-01-03 DIAGNOSIS — E113513 Type 2 diabetes mellitus with proliferative diabetic retinopathy with macular edema, bilateral: Secondary | ICD-10-CM

## 2021-01-03 DIAGNOSIS — H35033 Hypertensive retinopathy, bilateral: Secondary | ICD-10-CM | POA: Diagnosis not present

## 2021-01-03 DIAGNOSIS — I10 Essential (primary) hypertension: Secondary | ICD-10-CM

## 2021-01-03 DIAGNOSIS — H43813 Vitreous degeneration, bilateral: Secondary | ICD-10-CM | POA: Diagnosis not present

## 2021-01-03 DIAGNOSIS — D3132 Benign neoplasm of left choroid: Secondary | ICD-10-CM | POA: Diagnosis not present

## 2021-02-07 ENCOUNTER — Encounter (INDEPENDENT_AMBULATORY_CARE_PROVIDER_SITE_OTHER): Payer: Medicare PPO | Admitting: Ophthalmology

## 2021-02-07 ENCOUNTER — Other Ambulatory Visit: Payer: Self-pay

## 2021-02-07 DIAGNOSIS — I10 Essential (primary) hypertension: Secondary | ICD-10-CM | POA: Diagnosis not present

## 2021-02-07 DIAGNOSIS — H43813 Vitreous degeneration, bilateral: Secondary | ICD-10-CM

## 2021-02-07 DIAGNOSIS — H35033 Hypertensive retinopathy, bilateral: Secondary | ICD-10-CM

## 2021-02-07 DIAGNOSIS — E113513 Type 2 diabetes mellitus with proliferative diabetic retinopathy with macular edema, bilateral: Secondary | ICD-10-CM | POA: Diagnosis not present

## 2021-03-16 ENCOUNTER — Other Ambulatory Visit: Payer: Self-pay

## 2021-03-16 ENCOUNTER — Encounter (INDEPENDENT_AMBULATORY_CARE_PROVIDER_SITE_OTHER): Payer: Medicare PPO | Admitting: Ophthalmology

## 2021-03-16 DIAGNOSIS — I10 Essential (primary) hypertension: Secondary | ICD-10-CM | POA: Diagnosis not present

## 2021-03-16 DIAGNOSIS — H35033 Hypertensive retinopathy, bilateral: Secondary | ICD-10-CM

## 2021-03-16 DIAGNOSIS — E113513 Type 2 diabetes mellitus with proliferative diabetic retinopathy with macular edema, bilateral: Secondary | ICD-10-CM

## 2021-03-16 DIAGNOSIS — H43813 Vitreous degeneration, bilateral: Secondary | ICD-10-CM | POA: Diagnosis not present

## 2021-03-22 ENCOUNTER — Ambulatory Visit: Payer: Medicare PPO | Admitting: Nurse Practitioner

## 2021-04-19 ENCOUNTER — Other Ambulatory Visit: Payer: Self-pay

## 2021-04-19 ENCOUNTER — Encounter (INDEPENDENT_AMBULATORY_CARE_PROVIDER_SITE_OTHER): Payer: Medicare PPO | Admitting: Ophthalmology

## 2021-04-19 DIAGNOSIS — H35033 Hypertensive retinopathy, bilateral: Secondary | ICD-10-CM

## 2021-04-19 DIAGNOSIS — I10 Essential (primary) hypertension: Secondary | ICD-10-CM

## 2021-04-19 DIAGNOSIS — E113513 Type 2 diabetes mellitus with proliferative diabetic retinopathy with macular edema, bilateral: Secondary | ICD-10-CM | POA: Diagnosis not present

## 2021-04-19 DIAGNOSIS — D3132 Benign neoplasm of left choroid: Secondary | ICD-10-CM

## 2021-04-19 DIAGNOSIS — H43813 Vitreous degeneration, bilateral: Secondary | ICD-10-CM

## 2021-05-31 ENCOUNTER — Encounter (INDEPENDENT_AMBULATORY_CARE_PROVIDER_SITE_OTHER): Payer: Medicare PPO | Admitting: Ophthalmology

## 2021-05-31 ENCOUNTER — Other Ambulatory Visit: Payer: Self-pay

## 2021-05-31 DIAGNOSIS — D3132 Benign neoplasm of left choroid: Secondary | ICD-10-CM | POA: Diagnosis not present

## 2021-05-31 DIAGNOSIS — I10 Essential (primary) hypertension: Secondary | ICD-10-CM | POA: Diagnosis not present

## 2021-05-31 DIAGNOSIS — H35033 Hypertensive retinopathy, bilateral: Secondary | ICD-10-CM | POA: Diagnosis not present

## 2021-05-31 DIAGNOSIS — E113513 Type 2 diabetes mellitus with proliferative diabetic retinopathy with macular edema, bilateral: Secondary | ICD-10-CM

## 2021-05-31 DIAGNOSIS — H43813 Vitreous degeneration, bilateral: Secondary | ICD-10-CM

## 2021-06-06 ENCOUNTER — Ambulatory Visit: Payer: Medicare PPO | Admitting: Nurse Practitioner

## 2021-07-05 ENCOUNTER — Ambulatory Visit: Payer: Medicare PPO | Admitting: Nurse Practitioner

## 2021-07-12 ENCOUNTER — Encounter (INDEPENDENT_AMBULATORY_CARE_PROVIDER_SITE_OTHER): Payer: Medicare PPO | Admitting: Ophthalmology

## 2021-07-12 DIAGNOSIS — I10 Essential (primary) hypertension: Secondary | ICD-10-CM | POA: Diagnosis not present

## 2021-07-12 DIAGNOSIS — H35033 Hypertensive retinopathy, bilateral: Secondary | ICD-10-CM | POA: Diagnosis not present

## 2021-07-12 DIAGNOSIS — H43813 Vitreous degeneration, bilateral: Secondary | ICD-10-CM

## 2021-07-12 DIAGNOSIS — E113513 Type 2 diabetes mellitus with proliferative diabetic retinopathy with macular edema, bilateral: Secondary | ICD-10-CM

## 2021-07-12 DIAGNOSIS — D3132 Benign neoplasm of left choroid: Secondary | ICD-10-CM

## 2021-07-22 ENCOUNTER — Other Ambulatory Visit: Payer: Self-pay | Admitting: Nurse Practitioner

## 2021-07-22 DIAGNOSIS — E1165 Type 2 diabetes mellitus with hyperglycemia: Secondary | ICD-10-CM

## 2021-07-30 ENCOUNTER — Other Ambulatory Visit: Payer: Self-pay | Admitting: Nurse Practitioner

## 2021-07-30 DIAGNOSIS — I10 Essential (primary) hypertension: Secondary | ICD-10-CM

## 2021-08-03 ENCOUNTER — Other Ambulatory Visit: Payer: Self-pay | Admitting: Nurse Practitioner

## 2021-08-03 DIAGNOSIS — E782 Mixed hyperlipidemia: Secondary | ICD-10-CM

## 2021-08-03 DIAGNOSIS — R609 Edema, unspecified: Secondary | ICD-10-CM

## 2021-08-03 DIAGNOSIS — I10 Essential (primary) hypertension: Secondary | ICD-10-CM

## 2021-08-12 ENCOUNTER — Encounter: Payer: Self-pay | Admitting: Nurse Practitioner

## 2021-08-12 ENCOUNTER — Other Ambulatory Visit: Payer: Self-pay | Admitting: Nurse Practitioner

## 2021-08-12 DIAGNOSIS — E1165 Type 2 diabetes mellitus with hyperglycemia: Secondary | ICD-10-CM

## 2021-08-12 NOTE — Telephone Encounter (Signed)
Letter Sent.

## 2021-08-12 NOTE — Telephone Encounter (Signed)
MMM NTBS 30 days given 07/22/21

## 2021-08-17 ENCOUNTER — Ambulatory Visit: Payer: Medicare PPO | Admitting: Nurse Practitioner

## 2021-08-17 ENCOUNTER — Encounter: Payer: Self-pay | Admitting: Nurse Practitioner

## 2021-08-17 ENCOUNTER — Other Ambulatory Visit: Payer: Self-pay | Admitting: Nurse Practitioner

## 2021-08-17 VITALS — BP 142/82 | HR 73 | Temp 97.1°F | Resp 20 | Ht 68.0 in | Wt 285.0 lb

## 2021-08-17 DIAGNOSIS — I10 Essential (primary) hypertension: Secondary | ICD-10-CM

## 2021-08-17 DIAGNOSIS — E1165 Type 2 diabetes mellitus with hyperglycemia: Secondary | ICD-10-CM | POA: Diagnosis not present

## 2021-08-17 DIAGNOSIS — R609 Edema, unspecified: Secondary | ICD-10-CM | POA: Diagnosis not present

## 2021-08-17 DIAGNOSIS — E782 Mixed hyperlipidemia: Secondary | ICD-10-CM | POA: Diagnosis not present

## 2021-08-17 DIAGNOSIS — Z794 Long term (current) use of insulin: Secondary | ICD-10-CM | POA: Diagnosis not present

## 2021-08-17 LAB — BAYER DCA HB A1C WAIVED: HB A1C (BAYER DCA - WAIVED): 11.3 % — ABNORMAL HIGH (ref 4.8–5.6)

## 2021-08-17 MED ORDER — ATORVASTATIN CALCIUM 80 MG PO TABS: 80.0000 mg | ORAL_TABLET | Freq: Every day | ORAL | 0 refills | Status: DC

## 2021-08-17 MED ORDER — INSULIN LISPRO 100 UNIT/ML IJ SOLN: 40.0000 [IU] | Freq: Three times a day (TID) | INTRAMUSCULAR | 11 refills | Status: DC

## 2021-08-17 MED ORDER — LISINOPRIL 20 MG PO TABS: 20.0000 mg | ORAL_TABLET | Freq: Two times a day (BID) | ORAL | 1 refills | Status: DC

## 2021-08-17 MED ORDER — JANUMET XR 50-1000 MG PO TB24: 1.0000 | ORAL_TABLET | Freq: Every day | ORAL | 0 refills | Status: DC

## 2021-08-17 MED ORDER — METOPROLOL SUCCINATE ER 100 MG PO TB24: 100.0000 mg | ORAL_TABLET | Freq: Every day | ORAL | 0 refills | Status: DC

## 2021-08-17 MED ORDER — FUROSEMIDE 40 MG PO TABS: 40.0000 mg | ORAL_TABLET | Freq: Every day | ORAL | 0 refills | Status: DC

## 2021-08-17 MED ORDER — TRESIBA FLEXTOUCH 200 UNIT/ML ~~LOC~~ SOPN: 160.0000 [IU] | PEN_INJECTOR | Freq: Every day | SUBCUTANEOUS | 3 refills | Status: DC

## 2021-08-17 NOTE — Addendum Note (Signed)
Addended by: Rolena Infante on: 08/17/2021 02:50 PM   Modules accepted: Orders

## 2021-08-17 NOTE — Progress Notes (Signed)
Subjective:    Patient ID: Emma Griffin, female    DOB: 06/18/1950, 71 y.o.   MRN: [id number]   Chief Complaint: medical management of chronic issues     HPI:  Emma Griffin is a 71 y.o. who identifies as a female who was assigned female at birth.   Social history: Lives with: her son and daughter in law Work history: disability   Comes in today for follow up of the following chronic medical issues:  1. Primary hypertension No c/o chest pain, sob or headache. Does not check blood pressure at home. BP Readings from Last 3 Encounters:  12/20/20 140/76  09/09/20 (!) 152/88  07/06/20 138/76     2. Mixed hyperlipidemia Does not watch diet and does no exercise at all. Lab Results  Component Value Date   CHOL 131 12/20/2020   HDL 30 (L) 12/20/2020   LDLCALC 43 12/20/2020   TRIG 396 (H) 12/20/2020   CHOLHDL 4.4 12/20/2020    3. Type 2 diabetes mellitus with hyperglycemia, with long-term current use of insulin (HCC) Fasting blood sugars are running around 200-400. She does not check blood sugars every day. Encouraged to do humalog TID as ordered and watch diet. She doe sot watch her diet at all. Lab Results  Component Value Date   HGBA1C 12.4 (H) 12/20/2020     4. Peripheral edema Has daily edema  5. Morbid obesity ([place]) No recent weight changes. Wt Readings from Last 3 Encounters:  08/17/21 285 lb (129.3 kg)  12/20/20 286 lb (129.7 kg)  09/09/20 283 lb (128.4 kg)   BMI Readings from Last 3 Encounters:  08/17/21 43.33 kg/m  12/20/20 43.49 kg/m  09/09/20 43.03 kg/m     New complaints: None today  Allergies  Allergen Reactions   Amlodipine Other (See Comments)    Other reaction(s): Other edema edema   Outpatient Encounter Medications as of 08/17/2021  Medication Sig   ACCU-CHEK GUIDE test strip TEST UP TO 4 TIMES DAILY DX E11.65   Accu-Chek Softclix Lancets lancets TEST 4 TIMES DAILY Dx E11.6   Ascorbic Acid (VITAMIN C) 1000 MG tablet Take 1,000  mg by mouth daily.   ASPIRIN LOW DOSE 81 MG chewable tablet CHEW 1 TABLET BY MOUTH DAILY.   atorvastatin (LIPITOR) 80 MG tablet Take 1 tablet (80 mg total) by mouth daily. (NEEDS TO BE SEEN BEFORE NEXT REFILL)   bevacizumab (AVASTIN) 1.25 mg/0.1 mL SOLN Apply 1.25 mg to eye See admin instructions. Opthalmic injection every 4-6 weeks   Calcium Carbonate-Vitamin D (CVS CALCIUM/VIT D SOFT CHEWS PO) Take 1 tablet by mouth daily.    Continuous Blood Gluc Receiver (FREESTYLE LIBRE 2 READER) DEVI Use to test blood sugars 6 times daily as directed. DX: E11.65   Continuous Blood Gluc Sensor (FREESTYLE LIBRE 2 SENSOR) MISC Use to test blood sugars 6 times daily as directed. DX: E11.65   furosemide (LASIX) 40 MG tablet Take 1 tablet (40 mg total) by mouth daily. (NEEDS TO BE SEEN BEFORE NEXT REFILL)   ibuprofen (ADVIL,MOTRIN) 200 MG tablet Take 400-600 mg by mouth every 6 (six) hours as needed. For pain/cramps   insulin aspart (NOVOLOG) 100 UNIT/ML FlexPen Check blood sugars if greater then 200- 40u of insulin prior to breakfast , linch and dinner   Insulin Pen Needle (NOVOFINE) 32G X 6 MM MISC Use with insulin pen 4 times daily Dx E11.6   lisinopril (ZESTRIL) 20 MG tablet TAKE 1 TABLET BY MOUTH TWICE A DAY   metoprolol  succinate (TOPROL-XL) 100 MG 24 hr tablet Take 1 tablet (100 mg total) by mouth daily. TAKE WITH OR IMMEDIATELY FOLLOWING A MEAL. (NEEDS TO BE SEEN BEFORE NEXT REFILL)   Multiple Vitamins-Minerals (AIRBORNE) CHEW Chew 1 tablet by mouth as needed.    nitroGLYCERIN (NITROSTAT) 0.4 MG SL tablet Place under the tongue.   SitaGLIPtin-MetFORMIN HCl (JANUMET XR) 50-1000 MG TB24 Take 1 tablet by mouth daily. with food (NEEDS TO BE SEEN BEFORE NEXT REFILL)   No facility-administered encounter medications on file as of 08/17/2021.    Past Surgical History:  Procedure Laterality Date   CESAREAN SECTION  1987   x 1   EYE SURGERY     weak muscle eye surgery as child   HYSTEROSCOPY WITH D & C   09/20/2011   Procedure: DILATATION AND CURETTAGE /HYSTEROSCOPY;  Surgeon: Allyn Kenner, DO;  Location: [facility];  Service: Gynecology;  Laterality: N/A;  colposcopy   LITHOTRIPSY     kidney stone   REFRACTIVE SURGERY     broken blood vessels behind eyes - bilaterally   right side wisdom teeth ext     still has left wisdom teeth top/botom   TOOTH EXTRACTION     upper and lower back right    Family History  Problem Relation Age of Onset   Diabetes Mother    Hypertension Mother    Atrial fibrillation Mother    Heart attack Father    Diabetes Sister       Controlled substance contract: n/a     Review of Systems  Constitutional:  Negative for diaphoresis.  Eyes:  Negative for pain.  Respiratory:  Negative for shortness of breath.   Cardiovascular:  Negative for chest pain, palpitations and leg swelling.  Gastrointestinal:  Negative for abdominal pain.  Endocrine: Negative for polydipsia.  Skin:  Negative for rash.  Neurological:  Negative for dizziness, weakness and headaches.  Hematological:  Does not bruise/bleed easily.  All other systems reviewed and are negative.      Objective:   Physical Exam Vitals and nursing note reviewed.  Constitutional:      General: She is not in acute distress.    Appearance: Normal appearance. She is well-developed.  HENT:     Head: Normocephalic.     Right Ear: Tympanic membrane normal.     Left Ear: Tympanic membrane normal.     Nose: Nose normal.     Mouth/Throat:     Mouth: Mucous membranes are moist.  Eyes:     Pupils: Pupils are equal, round, and reactive to light.  Neck:     Vascular: No carotid bruit or JVD.  Cardiovascular:     Rate and Rhythm: Normal rate and regular rhythm.     Heart sounds: Normal heart sounds.  Pulmonary:     Effort: Pulmonary effort is normal. No respiratory distress.     Breath sounds: Normal breath sounds. No wheezing or rales.  Chest:     Chest wall: No tenderness.  Abdominal:      General: Bowel sounds are normal. There is no distension or abdominal bruit.     Palpations: Abdomen is soft. There is no hepatomegaly, splenomegaly, mass or pulsatile mass.     Tenderness: There is no abdominal tenderness.  Musculoskeletal:        General: Normal range of motion.     Cervical back: Normal range of motion and neck supple.     Right lower leg: Edema (1+) present.  Left lower leg: Edema (1+) present.  Lymphadenopathy:     Cervical: No cervical adenopathy.  Skin:    General: Skin is warm and dry.  Neurological:     Mental Status: She is alert and oriented to person, place, and time.     Deep Tendon Reflexes: Reflexes are normal and symmetric.  Psychiatric:        Behavior: Behavior normal.        Thought Content: Thought content normal.        Judgment: Judgment normal.    BP (!) 142/82   Pulse 73   Temp (!) 97.1 F (36.2 C) (Temporal)   Resp 20   Ht _0  (1.727 m)   Wt 285 lb (129.3 kg)   SpO2 94%   BMI 43.33 kg/m          Assessment & Plan:   Emma Griffin comes in today with chief complaint of Medical Management of Chronic Issues   Diagnosis and orders addressed:  1. Primary hypertension Low sodium diet - CBC with Differential/Platelet - CMP14+EGFR - metoprolol succinate (TOPROL-XL) 100 MG 24 hr tablet; Take 1 tablet (100 mg total) by mouth daily. TAKE WITH OR IMMEDIATELY FOLLOWING A MEAL. (NEEDS TO BE SEEN BEFORE NEXT REFILL)  Dispense: 30 tablet; Refill: 0 - lisinopril (ZESTRIL) 20 MG tablet; Take 1 tablet (20 mg total) by mouth 2 (two) times daily.  Dispense: 180 tablet; Refill: 1  2. Mixed hyperlipidemia Low fat diet - Lipid panel - atorvastatin (LIPITOR) 80 MG tablet; Take 1 tablet (80 mg total) by mouth daily. (NEEDS TO BE SEEN BEFORE NEXT REFILL)  Dispense: 30 tablet; Refill: 0  3. Type 2 diabetes mellitus with hyperglycemia, with long-term current use of insulin ([place]) Discussed libre- patient is afraid of it. Will make appointment  with clinical pharmacist- she would like to try ozempic - Bayer DCA Hb A1c Waived - SitaGLIPtin-MetFORMIN HCl (JANUMET XR) 50-1000 MG TB24; Take 1 tablet by mouth daily. with food (NEEDS TO BE SEEN BEFORE NEXT REFILL)  Dispense: 30 tablet; Refill: 0 - insulin degludec (TRESIBA FLEXTOUCH) 200 UNIT/ML FlexTouch Pen; Inject 160 Units into the skin daily.  Dispense: 40 mL; Refill: 3  4. Peripheral edema Elevate legs when sitting - furosemide (LASIX) 40 MG tablet; Take 1 tablet (40 mg total) by mouth daily. (NEEDS TO BE SEEN BEFORE NEXT REFILL)  Dispense: 30 tablet; Refill: 0  5. Morbid obesity ([place]) Discussed diet and exercise for person with BMI >25 Will recheck weight in 3-6 months    Labs pending Health Maintenance reviewed Diet and exercise encouraged  Follow up plan: 3 months   Mary-Margaret Hassell Done, [name]

## 2021-08-18 ENCOUNTER — Telehealth: Payer: Self-pay

## 2021-08-18 LAB — CBC WITH DIFFERENTIAL/PLATELET
Basophils Absolute: 0 10*3/uL (ref 0.0–0.2)
Basos: 0 %
EOS (ABSOLUTE): 0.3 10*3/uL (ref 0.0–0.4)
Eos: 2 %
Hematocrit: 38.8 % (ref 34.0–46.6)
Hemoglobin: 12.8 g/dL (ref 11.1–15.9)
Immature Grans (Abs): 0 10*3/uL (ref 0.0–0.1)
Immature Granulocytes: 0 %
Lymphocytes Absolute: 2.7 10*3/uL (ref 0.7–3.1)
Lymphs: 24 %
MCH: 25.9 pg — ABNORMAL LOW (ref 26.6–33.0)
MCHC: 33 g/dL (ref 31.5–35.7)
MCV: 79 fL (ref 79–97)
Monocytes Absolute: 1 10*3/uL — ABNORMAL HIGH (ref 0.1–0.9)
Monocytes: 8 %
Neutrophils Absolute: 7.4 10*3/uL — ABNORMAL HIGH (ref 1.4–7.0)
Neutrophils: 66 %
Platelets: 206 10*3/uL (ref 150–450)
RBC: 4.94 x10E6/uL (ref 3.77–5.28)
RDW: 14.5 % (ref 11.7–15.4)
WBC: 11.5 10*3/uL — ABNORMAL HIGH (ref 3.4–10.8)

## 2021-08-18 LAB — CMP14+EGFR
ALT: 32 IU/L (ref 0–32)
AST: 27 IU/L (ref 0–40)
Albumin/Globulin Ratio: 1.2 (ref 1.2–2.2)
Albumin: 4 g/dL (ref 3.7–4.7)
Alkaline Phosphatase: 131 IU/L — ABNORMAL HIGH (ref 44–121)
BUN/Creatinine Ratio: 22 (ref 12–28)
BUN: 19 mg/dL (ref 8–27)
Bilirubin Total: 0.5 mg/dL (ref 0.0–1.2)
CO2: 21 mmol/L (ref 20–29)
Calcium: 9.3 mg/dL (ref 8.7–10.3)
Chloride: 99 mmol/L (ref 96–106)
Creatinine, Ser: 0.88 mg/dL (ref 0.57–1.00)
Globulin, Total: 3.4 g/dL (ref 1.5–4.5)
Glucose: 254 mg/dL — ABNORMAL HIGH (ref 70–99)
Potassium: 4 mmol/L (ref 3.5–5.2)
Sodium: 138 mmol/L (ref 134–144)
Total Protein: 7.4 g/dL (ref 6.0–8.5)
eGFR: 70 mL/min/{1.73_m2} (ref >59)

## 2021-08-18 LAB — LIPID PANEL
Chol/HDL Ratio: 3.9 ratio (ref 0.0–4.4)
Cholesterol, Total: 126 mg/dL (ref 100–199)
HDL: 32 mg/dL — ABNORMAL LOW (ref >39)
LDL Chol Calc (NIH): 39 mg/dL (ref 0–99)
Triglycerides: 377 mg/dL — ABNORMAL HIGH (ref 0–149)
VLDL Cholesterol Cal: 55 mg/dL — ABNORMAL HIGH (ref 5–40)

## 2021-08-18 NOTE — Chronic Care Management (AMB) (Signed)
  Chronic Care Management   Note  08/18/2021 Name: Nailah Luepke MRN: [id number] DOB: Jan 02, 1951  Kiley Solimine is a 71 y.o. year old female who is a primary care patient of [name], [place]. I reached out to [name] by phone today in response to a referral sent by Ms. Butch Penny Bordeau's PCP.  Ms. Abid was given information about Chronic Care Management services today including:  CCM service includes personalized support from designated clinical staff supervised by her physician, including individualized plan of care and coordination with other care providers 24/7 contact phone numbers for assistance for urgent and routine care needs. Service will only be billed when office clinical staff spend 20 minutes or more in a month to coordinate care. Only one practitioner may furnish and bill the service in a calendar month. The patient may stop CCM services at any time (effective at the end of the month) by phone call to the office staff. The patient is responsible for co-pay (up to 20% after annual deductible is met) if co-pay is required by the individual health plan.   Patient agreed to services and verbal consent obtained.   Follow up plan: Face to Face appointment with care management team member scheduled for: 09/21/2021  Noreene Larsson, [place], [place], [place] [address] Direct Dial: [phone] [email or web address] Website: [email or web address]

## 2021-08-19 ENCOUNTER — Telehealth: Payer: Self-pay | Admitting: Nurse Practitioner

## 2021-08-19 NOTE — Telephone Encounter (Signed)
Pt states she will have to wait for mandy because she is unsure what planet mmm was on at her appointment and disconnected phone.

## 2021-08-22 ENCOUNTER — Other Ambulatory Visit: Payer: Self-pay

## 2021-08-22 DIAGNOSIS — E782 Mixed hyperlipidemia: Secondary | ICD-10-CM

## 2021-08-22 DIAGNOSIS — R609 Edema, unspecified: Secondary | ICD-10-CM

## 2021-08-22 DIAGNOSIS — I10 Essential (primary) hypertension: Secondary | ICD-10-CM

## 2021-08-22 DIAGNOSIS — E1165 Type 2 diabetes mellitus with hyperglycemia: Secondary | ICD-10-CM

## 2021-08-22 MED ORDER — NITROGLYCERIN 0.4 MG SL SUBL: 0.4000 mg | SUBLINGUAL_TABLET | SUBLINGUAL | 2 refills | Status: AC | PRN

## 2021-08-22 MED ORDER — ASPIRIN 81 MG PO CHEW: CHEWABLE_TABLET | ORAL | 1 refills | Status: DC

## 2021-08-22 MED ORDER — TRESIBA FLEXTOUCH 200 UNIT/ML ~~LOC~~ SOPN: 160.0000 [IU] | PEN_INJECTOR | Freq: Every day | SUBCUTANEOUS | 3 refills | Status: DC

## 2021-08-22 MED ORDER — JANUMET XR 50-1000 MG PO TB24: 1.0000 | ORAL_TABLET | Freq: Every day | ORAL | 1 refills | Status: DC

## 2021-08-22 MED ORDER — ATORVASTATIN CALCIUM 80 MG PO TABS: 80.0000 mg | ORAL_TABLET | Freq: Every day | ORAL | 0 refills | Status: DC

## 2021-08-22 MED ORDER — LISINOPRIL 20 MG PO TABS: 20.0000 mg | ORAL_TABLET | Freq: Two times a day (BID) | ORAL | 1 refills | Status: DC

## 2021-08-22 MED ORDER — FUROSEMIDE 40 MG PO TABS: 40.0000 mg | ORAL_TABLET | Freq: Every day | ORAL | 1 refills | Status: DC

## 2021-08-22 MED ORDER — METOPROLOL SUCCINATE ER 100 MG PO TB24: 100.0000 mg | ORAL_TABLET | Freq: Every day | ORAL | 1 refills | Status: DC

## 2021-08-22 NOTE — Telephone Encounter (Signed)
Spoke with patient and refills sent in to pharmacy as requested.

## 2021-08-22 NOTE — Telephone Encounter (Signed)
See other encounter. Patient called back and we got her meds fixed at the pharmacy

## 2021-08-23 ENCOUNTER — Encounter (INDEPENDENT_AMBULATORY_CARE_PROVIDER_SITE_OTHER): Payer: Medicare PPO | Admitting: Ophthalmology

## 2021-08-23 DIAGNOSIS — E113513 Type 2 diabetes mellitus with proliferative diabetic retinopathy with macular edema, bilateral: Secondary | ICD-10-CM | POA: Diagnosis not present

## 2021-08-23 DIAGNOSIS — H43813 Vitreous degeneration, bilateral: Secondary | ICD-10-CM | POA: Diagnosis not present

## 2021-08-23 DIAGNOSIS — H35033 Hypertensive retinopathy, bilateral: Secondary | ICD-10-CM

## 2021-08-23 DIAGNOSIS — D3132 Benign neoplasm of left choroid: Secondary | ICD-10-CM

## 2021-08-23 DIAGNOSIS — I10 Essential (primary) hypertension: Secondary | ICD-10-CM

## 2021-09-21 ENCOUNTER — Ambulatory Visit (INDEPENDENT_AMBULATORY_CARE_PROVIDER_SITE_OTHER): Payer: Medicare PPO | Admitting: Pharmacist

## 2021-09-21 DIAGNOSIS — E782 Mixed hyperlipidemia: Secondary | ICD-10-CM

## 2021-09-21 DIAGNOSIS — Z794 Long term (current) use of insulin: Secondary | ICD-10-CM

## 2021-09-21 MED ORDER — SEMAGLUTIDE(0.25 OR 0.5MG/DOS) 2 MG/3ML ~~LOC~~ SOPN: PEN_INJECTOR | SUBCUTANEOUS | 0 refills | Status: DC

## 2021-09-21 NOTE — Patient Instructions (Signed)
Visit Information  Following are the goals we discussed today:  Diabetes: New goal. Uncontrolled-A1C 11.3%, GFR 70 ; current treatment: TRESIBA, HUMALOG, JANUMET  PAST MEDS: levemir, avandia, prandin, novolog Plan: HOLD Janumet, START Ozempic, continue insulin as prescribed  Will likely complete patient assistance  Current glucose readings: fasting glucose: 200, post prandial glucose: 200-300s Discussed meal planning options and Plate method for healthy eating Avoid sugary drinks and desserts Incorporate balanced protein, non starchy veggies, 1 serving of carbohydrate with each meal Increase water intake Increase physical activity as able Educated on [employer]; Denies personal and family history of Medullary thyroid cancer (MTC) Recommended start ozempic  Collaborated with PCP  Patient Goals/Self-Care Activities patient will:  - take medications as prescribed as evidenced by patient report and record review target a minimum of 150 minutes of moderate intensity exercise weekly engage in dietary modifications by FOLLOWING A HEART HEALTHY DIET/HEALTHY PLATE METHOD  Plan: Telephone follow up appointment with care management team member scheduled for:  3 months  Signature Regina Eck, PharmD, BCPS Clinical Pharmacist, [place]  II Phone [phone]   Please call the care guide team at [phone] if you need to cancel or reschedule your appointment.   The patient verbalized understanding of instructions, educational materials, and care plan provided today and DECLINED offer to receive copy of patient instructions, educational materials, and care plan.

## 2021-09-21 NOTE — Progress Notes (Signed)
Chronic Care Management Pharmacy Note  09/21/2021 Name:  Emma Griffin MRN:  [id number] DOB:  19-May-1950  Summary: Diabetes: New goal. Uncontrolled-A1C 11.3%, GFR 70 ; current treatment: TRESIBA, HUMALOG, JANUMET  PAST MEDS: levemir, avandia, prandin, novolog Plan: HOLD Janumet, START Ozempic, continue insulin as prescribed  Will likely complete patient assistance  Current glucose readings: fasting glucose: 200, post prandial glucose: 200-300s Discussed meal planning options and Plate method for healthy eating Avoid sugary drinks and desserts Incorporate balanced protein, non starchy veggies, 1 serving of carbohydrate with each meal Increase water intake Increase physical activity as able Educated on [employer]; Denies personal and family history of Medullary thyroid cancer (MTC) Recommended start ozempic  Collaborated with PCP  Patient Goals/Self-Care Activities patient will:  - take medications as prescribed as evidenced by patient report and record review target a minimum of 150 minutes of moderate intensity exercise weekly engage in dietary modifications by FOLLOWING A HEART HEALTHY DIET/HEALTHY PLATE METHOD  Subjective: Emma Griffin is an 71 y.o. year old female who is a primary patient of [name], [place].  The CCM team was consulted for assistance with disease management and care coordination needs.    Engaged with patient face to face for initial visit in response to provider referral for pharmacy case management and/or care coordination services.   Consent to Services:  The patient was given information about Chronic Care Management services, agreed to services, and gave verbal consent prior to initiation of services.  Please see initial visit note for detailed documentation.   Patient Care Team: [name], [name] as PCP - General (Nurse Practitioner) Lavera Guise, [facility] as Pharmacist (Family Medicine)  Objective:  Lab Results  Component Value Date    CREATININE 0.88 08/17/2021   CREATININE 0.90 12/20/2020   CREATININE 0.99 09/09/2020    Lab Results  Component Value Date   HGBA1C 11.3 (H) 08/17/2021   Last diabetic Eye exam:  Lab Results  Component Value Date/Time   HMDIABEYEEXA Retinopathy (A) 05/17/2020 12:00 AM    Last diabetic Foot exam: No results found for: "HMDIABFOOTEX"      Component Value Date/Time   CHOL 126 08/17/2021 1341   TRIG 377 (H) 08/17/2021 1341   TRIG 389 (H) 05/27/2014 1034   HDL 32 (L) 08/17/2021 1341   HDL 31 (L) 05/27/2014 1034   CHOLHDL 3.9 08/17/2021 1341   LDLCALC 39 08/17/2021 1341   LDLCALC Comment 06/26/2013 1704       Latest Ref Rng & Units 08/17/2021    1:41 PM 12/20/2020    2:55 PM 09/09/2020    2:40 PM  Hepatic Function  Total Protein 6.0 - 8.5 g/dL 7.4  7.4  7.7   Albumin 3.7 - 4.7 g/dL 4.0  3.8  4.1   AST 0 - 40 IU/L 27  40  39   ALT 0 - 32 IU/L 32  37  55   Alk Phosphatase 44 - 121 IU/L 131  130  146   Total Bilirubin 0.0 - 1.2 mg/dL 0.5  0.6  0.5     Lab Results  Component Value Date/Time   TSH 4.300 06/28/2015 12:44 PM       Latest Ref Rng & Units 08/17/2021    1:41 PM 12/20/2020    2:55 PM 09/09/2020    2:40 PM  CBC  WBC 3.4 - 10.8 x10E3/uL 11.5  11.4  12.1   Hemoglobin 11.1 - 15.9 g/dL 12.8  14.0  13.2   Hematocrit 34.0 -  46.6 % 38.8  43.2  42.1   Platelets 150 - 450 x10E3/uL 206  245  224     No results found for: "VD25OH"  Clinical ASCVD: No  The ASCVD Risk score (Arnett DK, et al., 2019) failed to calculate for the following reasons:   The valid total cholesterol range is 130 to 320 mg/dL    Other: (CHADS2VASc if Afib, PHQ9 if depression, MMRC or CAT for COPD, ACT, DEXA)  Social History   Tobacco Use  Smoking Status Former   Types: Cigarettes   Quit date: 03/06/1973   Years since quitting: 48.5  Smokeless Tobacco Never   BP Readings from Last 3 Encounters:  08/17/21 (!) 142/82  12/20/20 140/76  09/09/20 (!) 152/88   Pulse Readings from Last 3  Encounters:  08/17/21 73  12/20/20 74  09/09/20 71   Wt Readings from Last 3 Encounters:  08/17/21 285 lb (129.3 kg)  12/20/20 286 lb (129.7 kg)  09/09/20 283 lb (128.4 kg)    Assessment: Review of patient past medical history, allergies, medications, health status, including review of consultants reports, laboratory and other test data, was performed as part of comprehensive evaluation and provision of chronic care management services.   SDOH:  (Social Determinants of Health) assessments and interventions performed:    CCM Care Plan  Allergies  Allergen Reactions   Amlodipine Other (See Comments)    Other reaction(s): Other edema edema    Medications Reviewed Today     Reviewed by Lavera Guise, [facility] (Pharmacist) on 09/21/21 at [phone]  Med List Status: <None>   Medication Order Taking? Sig Documenting Provider Last Dose Status Informant  ACCU-CHEK GUIDE test strip [id number] No TEST UP TO 4 TIMES DAILY DX E11.65 [name], [name] Taking Active   Accu-Chek Softclix Lancets lancets [id number] No TEST 4 TIMES DAILY Dx E11.6 [name], [name] Taking Active   Ascorbic Acid (VITAMIN C) 1000 MG tablet [id number] No Take 1,000 mg by mouth daily. [provider] Taking Active   aspirin (ASPIRIN LOW DOSE) 81 MG chewable tablet [id number]  CHEW 1 TABLET BY MOUTH DAILY. Hassell Done, Mary-Margaret, [name]  Active   atorvastatin (LIPITOR) 80 MG tablet [id number]  Take 1 tablet (80 mg total) by mouth daily. (NEEDS TO BE SEEN BEFORE NEXT REFILL) Hassell Done, Mary-Margaret, [name]  Active   bevacizumab (AVASTIN) 1.25 mg/0.1 mL SOLN [id number] No Apply 1.25 mg to eye See admin instructions. Opthalmic injection every 4-6 weeks [provider] Taking Active Self  Calcium Carbonate-Vitamin D (CVS CALCIUM/VIT D SOFT CHEWS PO) [id number] No Take 1 tablet by mouth daily.  [provider] Taking Active   furosemide (LASIX) 40 MG tablet [id number]  Take 1 tablet (40 mg total) by mouth  daily. Hassell Done, Mary-Margaret, [name]  Active   HUMALOG 100 UNIT/ML injection [id number]  INJECT 40 UNITS (0.4 ML) INTO THE SKIN 3 (THREE) TIMES DAILY BEFORE MEALS. Hassell Done, Mary-Margaret, [name]  Active   ibuprofen (ADVIL,MOTRIN) 200 MG tablet [id number] No Take 400-600 mg by mouth every 6 (six) hours as needed. For pain/cramps [provider] Taking Active Self  insulin degludec (TRESIBA FLEXTOUCH) 200 UNIT/ML FlexTouch Pen [id number]  Inject 160 Units into the skin daily. Hassell Done, Mary-Margaret, [name]  Active   Insulin Pen Needle (NOVOFINE) 32G X 6 MM MISC [id number] No Use with insulin pen 4 times daily Dx E11.6 [name], [name] Taking Active   lisinopril (ZESTRIL) 20 MG tablet [id number]  Take 1 tablet (20 mg total) by mouth 2 (two) times  daily. Hassell Done, Mary-Margaret, [name]  Active   metoprolol succinate (TOPROL-XL) 100 MG 24 hr tablet [id number]  Take 1 tablet (100 mg total) by mouth daily. TAKE WITH OR IMMEDIATELY FOLLOWING A MEAL. Hassell Done Mary-Margaret, [name]  Active   Multiple Vitamins-Minerals (AIRBORNE) CHEW [id number] No Chew 1 tablet by mouth as needed.  [provider] Taking Active   nitroGLYCERIN (NITROSTAT) 0.4 MG SL tablet [id number]  Place 1 tablet (0.4 mg total) under the tongue every 5 (five) minutes as needed for chest pain. Hassell Done, Mary-Margaret, [name]  Active   SitaGLIPtin-MetFORMIN HCl (JANUMET XR) 50-1000 MG TB24 [id number]  Take 1 tablet by mouth daily. with food [name], [name]  Active             Patient Active Problem List   Diagnosis Date Noted   Morbid obesity ([place]) 10/08/2017   Type 2 diabetes mellitus with hyperglycemia, with long-term current use of insulin ([place]) 08/08/2016   Gastroesophageal reflux disease without esophagitis 06/28/2015   Peripheral edema 06/28/2015   Hyperlipemia 09/30/2012   HTN (hypertension) 07/06/2010    Immunization History  Administered Date(s) Administered   Influenza, High Dose Seasonal PF 02/09/2017    Influenza,inj,Quad PF,6+ Mos 02/20/2014, 02/19/2015, 12/28/2015   Influenza-Unspecified 01/04/2018   Pneumococcal Conjugate-13 06/26/2013   Pneumococcal Polysaccharide-23 06/28/2015    Conditions to be addressed/monitored: HLD and DMII   Care Plan : PHARMD MEDICATION MANAGEMENT  Updates made by Lavera Guise, [place] since 09/21/2021 12:00 AM     Problem: DISEASE PROGRESSION PREVENTION      Long-Range Goal: T2DM PHARMD GOAL   This Visit's Progress: Not on track  Priority: High  Note:   Current Barriers:  Unable to achieve control of T1 VS T2DM  Suboptimal therapeutic regimen for T1 VS T2DM  Pharmacist Clinical Goal(s):  patient will achieve control of T2DM as evidenced by GOAL A1C adhere to plan to optimize therapeutic regimen for T2DM as evidenced by report of adherence to recommended medication management changes through collaboration with PharmD and provider.   Interventions: 1:1 collaboration with [name], [name] regarding development and update of comprehensive plan of care as evidenced by provider attestation and co-signature Inter-disciplinary care team collaboration (see longitudinal plan of care) Comprehensive medication review performed; medication list updated in electronic medical record  Diabetes: New goal. Uncontrolled-A1C 11.3%, GFR 70 ; current treatment: TRESIBA, HUMALOG, JANUMET  PAST MEDS: levemir, avandia, prandin, novolog Plan: HOLD Janumet, START Ozempic, continue insulin as prescribed  Will likely complete patient assistance  Current glucose readings: fasting glucose: 200, post prandial glucose: 200-300s Discussed meal planning options and Plate method for healthy eating Avoid sugary drinks and desserts Incorporate balanced protein, non starchy veggies, 1 serving of carbohydrate with each meal Increase water intake Increase physical activity as able Educated on [employer]; Denies personal and family history of Medullary thyroid cancer  (MTC) Recommended start ozempic  Collaborated with PCP  Patient Goals/Self-Care Activities patient will:  - take medications as prescribed as evidenced by patient report and record review target a minimum of 150 minutes of moderate intensity exercise weekly engage in dietary modifications by   FOLLOWING A HEART HEALTHY DIET/HEALTHY PLATE METHOD      Patient's preferred pharmacy is:  [id number] [id number]: [phone]: [phone] Follow Up:  Patient agrees to Care Plan and Follow-up.  Plan: Telephone follow up appointment with care management team member scheduled for:  4 weeks  Regina Eck, PharmD, BCPS Clinical Pharmacist, [place]  II Phone [phone]

## 2021-09-27 ENCOUNTER — Encounter (INDEPENDENT_AMBULATORY_CARE_PROVIDER_SITE_OTHER): Payer: Medicare PPO | Admitting: Ophthalmology

## 2021-09-27 DIAGNOSIS — H43813 Vitreous degeneration, bilateral: Secondary | ICD-10-CM | POA: Diagnosis not present

## 2021-09-27 DIAGNOSIS — I10 Essential (primary) hypertension: Secondary | ICD-10-CM

## 2021-09-27 DIAGNOSIS — D3132 Benign neoplasm of left choroid: Secondary | ICD-10-CM | POA: Diagnosis not present

## 2021-09-27 DIAGNOSIS — E113513 Type 2 diabetes mellitus with proliferative diabetic retinopathy with macular edema, bilateral: Secondary | ICD-10-CM | POA: Diagnosis not present

## 2021-09-27 DIAGNOSIS — H35033 Hypertensive retinopathy, bilateral: Secondary | ICD-10-CM

## 2021-10-03 DIAGNOSIS — E782 Mixed hyperlipidemia: Secondary | ICD-10-CM | POA: Diagnosis not present

## 2021-10-03 DIAGNOSIS — E1165 Type 2 diabetes mellitus with hyperglycemia: Secondary | ICD-10-CM

## 2021-10-03 DIAGNOSIS — Z794 Long term (current) use of insulin: Secondary | ICD-10-CM | POA: Diagnosis not present

## 2021-10-10 NOTE — Telephone Encounter (Signed)
Pt called to confirm that all of her Rx's were sent in correctly except her ATORVASTATIN. Says it was supposed to be sent in as a 90 day supply but was only sent for 30 day supply.

## 2021-10-11 ENCOUNTER — Other Ambulatory Visit: Payer: Self-pay | Admitting: Nurse Practitioner

## 2021-10-11 DIAGNOSIS — E782 Mixed hyperlipidemia: Secondary | ICD-10-CM

## 2021-10-11 NOTE — Telephone Encounter (Signed)
OV on 11/17/21

## 2021-11-01 ENCOUNTER — Encounter (INDEPENDENT_AMBULATORY_CARE_PROVIDER_SITE_OTHER): Payer: Medicare PPO | Admitting: Ophthalmology

## 2021-11-01 DIAGNOSIS — H35033 Hypertensive retinopathy, bilateral: Secondary | ICD-10-CM | POA: Diagnosis not present

## 2021-11-01 DIAGNOSIS — H43813 Vitreous degeneration, bilateral: Secondary | ICD-10-CM

## 2021-11-01 DIAGNOSIS — I10 Essential (primary) hypertension: Secondary | ICD-10-CM | POA: Diagnosis not present

## 2021-11-01 DIAGNOSIS — E113513 Type 2 diabetes mellitus with proliferative diabetic retinopathy with macular edema, bilateral: Secondary | ICD-10-CM

## 2021-11-01 DIAGNOSIS — D3132 Benign neoplasm of left choroid: Secondary | ICD-10-CM | POA: Diagnosis not present

## 2021-11-17 ENCOUNTER — Encounter: Payer: Self-pay | Admitting: Nurse Practitioner

## 2021-11-17 ENCOUNTER — Ambulatory Visit (INDEPENDENT_AMBULATORY_CARE_PROVIDER_SITE_OTHER): Payer: Medicare PPO | Admitting: Nurse Practitioner

## 2021-11-17 DIAGNOSIS — E1165 Type 2 diabetes mellitus with hyperglycemia: Secondary | ICD-10-CM

## 2021-11-17 DIAGNOSIS — Z794 Long term (current) use of insulin: Secondary | ICD-10-CM | POA: Diagnosis not present

## 2021-11-17 NOTE — Progress Notes (Signed)
   Virtual Visit  Note Due to COVID-19 pandemic this visit was conducted virtually. This visit type was conducted due to national recommendations for restrictions regarding the COVID-19 Pandemic (e.g. social distancing, sheltering in place) in an effort to limit this patient's exposure and mitigate transmission in our community. All issues noted in this document were discussed and addressed.  A physical exam was not performed with this format.  I connected with Emma Griffin on 11/17/21 at 1:53 by telephone and verified that I am speaking with the correct person using two identifiers. Emma Griffin is currently located at home and no one is currently with her during visit. The provider, Mary-Margaret Hassell Done, [name] is located in their office at time of visit.  I discussed the limitations, risks, security and privacy concerns of performing an evaluation and management service by telephone and the availability of in person appointments. I also discussed with the patient that there may be a patient responsible charge related to this service. The patient expressed understanding and agreed to proceed.   History and Present Illness:  Patient calls concerning her blood sugars. She would not come in for visit. Her blood sugars have been running high over 400 at times. She took her long acting and short acting and that brought it down to 320. Emma Griffin recently started on ozempic and she did not like how it made her feel.     Review of Systems  Constitutional:  Negative for diaphoresis and weight loss.  Eyes:  Negative for blurred vision, double vision and pain.  Respiratory:  Negative for shortness of breath.   Cardiovascular:  Negative for chest pain, palpitations, orthopnea and leg swelling.  Gastrointestinal:  Negative for abdominal pain.  Skin:  Negative for rash.  Neurological:  Negative for dizziness, sensory change, loss of consciousness, weakness and headaches.  Endo/Heme/Allergies:  Negative for  polydipsia. Does not bruise/bleed easily.  Psychiatric/Behavioral:  Negative for memory loss. The patient does not have insomnia.   All other systems reviewed and are negative.    Observations/Objective: Alert and oriented- answers all questions appropriately No distress   Assessment and Plan: .Emma Griffin in today with chief complaint of No chief complaint on file.   1. Type 2 diabetes mellitus with hyperglycemia, with long-term current use of insulin ([place]) Stop ozempic Bask on janumet until can get appointment with clinical pharmacist Continue to keep diary of blood sugars For now use humalog 10u now to see if will bring down blood sugar Strict low carb diet   Follow Up Instructions: Appointment scheduled for next week.    I discussed the assessment and treatment plan with the patient. The patient was provided an opportunity to ask questions and all were answered. The patient agreed with the plan and demonstrated an understanding of the instructions.   The patient was advised to call back or seek an in-person evaluation if the symptoms worsen or if the condition fails to improve as anticipated.  The above assessment and management plan was discussed with the patient. The patient verbalized understanding of and has agreed to the management plan. Patient is aware to call the clinic if symptoms persist or worsen. Patient is aware when to return to the clinic for a follow-up visit. Patient educated on when it is appropriate to go to the emergency department.   Time call ended:  2:06  I provided 13 minutes of  non face-to-face time during this encounter.    Mary-Margaret Hassell Done, [name]

## 2021-11-22 ENCOUNTER — Telehealth: Payer: Self-pay | Admitting: Pharmacist

## 2021-11-22 ENCOUNTER — Other Ambulatory Visit: Payer: Self-pay | Admitting: Nurse Practitioner

## 2021-11-22 ENCOUNTER — Ambulatory Visit (INDEPENDENT_AMBULATORY_CARE_PROVIDER_SITE_OTHER): Payer: Medicare PPO | Admitting: Nurse Practitioner

## 2021-11-22 ENCOUNTER — Encounter: Payer: Self-pay | Admitting: Nurse Practitioner

## 2021-11-22 VITALS — BP 142/78 | HR 75 | Temp 97.7°F | Resp 20 | Ht 68.0 in | Wt 281.0 lb

## 2021-11-22 DIAGNOSIS — Z794 Long term (current) use of insulin: Secondary | ICD-10-CM | POA: Diagnosis not present

## 2021-11-22 DIAGNOSIS — E1165 Type 2 diabetes mellitus with hyperglycemia: Secondary | ICD-10-CM | POA: Diagnosis not present

## 2021-11-22 DIAGNOSIS — K219 Gastro-esophageal reflux disease without esophagitis: Secondary | ICD-10-CM

## 2021-11-22 DIAGNOSIS — R600 Localized edema: Secondary | ICD-10-CM

## 2021-11-22 DIAGNOSIS — R609 Edema, unspecified: Secondary | ICD-10-CM

## 2021-11-22 DIAGNOSIS — I10 Essential (primary) hypertension: Secondary | ICD-10-CM | POA: Diagnosis not present

## 2021-11-22 DIAGNOSIS — E782 Mixed hyperlipidemia: Secondary | ICD-10-CM | POA: Diagnosis not present

## 2021-11-22 LAB — CBC WITH DIFFERENTIAL/PLATELET
Basophils Absolute: 0.1 10*3/uL (ref 0.0–0.2)
Basos: 0 %
EOS (ABSOLUTE): 0.3 10*3/uL (ref 0.0–0.4)
Eos: 3 %
Hematocrit: 40.8 % (ref 34.0–46.6)
Hemoglobin: 13.6 g/dL (ref 11.1–15.9)
Immature Grans (Abs): 0.1 10*3/uL (ref 0.0–0.1)
Immature Granulocytes: 1 %
Lymphocytes Absolute: 3.1 10*3/uL (ref 0.7–3.1)
Lymphs: 27 %
MCH: 26.4 pg — ABNORMAL LOW (ref 26.6–33.0)
MCHC: 33.3 g/dL (ref 31.5–35.7)
MCV: 79 fL (ref 79–97)
Monocytes Absolute: 0.9 10*3/uL (ref 0.1–0.9)
Monocytes: 8 %
Neutrophils Absolute: 6.9 10*3/uL (ref 1.4–7.0)
Neutrophils: 61 %
Platelets: 208 10*3/uL (ref 150–450)
RBC: 5.15 x10E6/uL (ref 3.77–5.28)
RDW: 14.4 % (ref 11.7–15.4)
WBC: 11.3 10*3/uL — ABNORMAL HIGH (ref 3.4–10.8)

## 2021-11-22 LAB — CMP14+EGFR
ALT: 36 IU/L — ABNORMAL HIGH (ref 0–32)
AST: 40 IU/L (ref 0–40)
Albumin/Globulin Ratio: 1.1 — ABNORMAL LOW (ref 1.2–2.2)
Albumin: 3.5 g/dL — ABNORMAL LOW (ref 3.8–4.8)
Alkaline Phosphatase: 129 IU/L — ABNORMAL HIGH (ref 44–121)
BUN/Creatinine Ratio: 20 (ref 12–28)
BUN: 21 mg/dL (ref 8–27)
Bilirubin Total: 0.6 mg/dL (ref 0.0–1.2)
CO2: 23 mmol/L (ref 20–29)
Calcium: 9.4 mg/dL (ref 8.7–10.3)
Chloride: 99 mmol/L (ref 96–106)
Creatinine, Ser: 1.03 mg/dL — ABNORMAL HIGH (ref 0.57–1.00)
Globulin, Total: 3.3 g/dL (ref 1.5–4.5)
Glucose: 245 mg/dL — ABNORMAL HIGH (ref 70–99)
Potassium: 4.2 mmol/L (ref 3.5–5.2)
Sodium: 138 mmol/L (ref 134–144)
Total Protein: 6.8 g/dL (ref 6.0–8.5)
eGFR: 58 mL/min/{1.73_m2} — ABNORMAL LOW (ref >59)

## 2021-11-22 LAB — BAYER DCA HB A1C WAIVED: HB A1C (BAYER DCA - WAIVED): 12.5 % — ABNORMAL HIGH (ref 4.8–5.6)

## 2021-11-22 LAB — LIPID PANEL
Chol/HDL Ratio: 3.8 ratio (ref 0.0–4.4)
Cholesterol, Total: 110 mg/dL (ref 100–199)
HDL: 29 mg/dL — ABNORMAL LOW (ref >39)
LDL Chol Calc (NIH): 47 mg/dL (ref 0–99)
Triglycerides: 212 mg/dL — ABNORMAL HIGH (ref 0–149)
VLDL Cholesterol Cal: 34 mg/dL (ref 5–40)

## 2021-11-22 MED ORDER — METOPROLOL SUCCINATE ER 100 MG PO TB24: 100.0000 mg | ORAL_TABLET | Freq: Every day | ORAL | 1 refills | Status: DC

## 2021-11-22 MED ORDER — FREESTYLE LIBRE 2 SENSOR MISC: 1.0000 | 5 refills | Status: DC

## 2021-11-22 MED ORDER — JANUMET XR 50-1000 MG PO TB24: 1.0000 | ORAL_TABLET | Freq: Every day | ORAL | 1 refills | Status: DC

## 2021-11-22 MED ORDER — ATORVASTATIN CALCIUM 80 MG PO TABS: 80.0000 mg | ORAL_TABLET | Freq: Every day | ORAL | 1 refills | Status: DC

## 2021-11-22 MED ORDER — SITAGLIPTIN PHOS-METFORMIN HCL 50-1000 MG PO TABS: 1.0000 | ORAL_TABLET | Freq: Every day | ORAL | 1 refills | Status: DC

## 2021-11-22 MED ORDER — FUROSEMIDE 40 MG PO TABS: 40.0000 mg | ORAL_TABLET | Freq: Every day | ORAL | 1 refills | Status: DC

## 2021-11-22 MED ORDER — FREESTYLE LIBRE 2 READER DEVI: 0 refills | Status: DC

## 2021-11-22 MED ORDER — LISINOPRIL 20 MG PO TABS: 20.0000 mg | ORAL_TABLET | Freq: Two times a day (BID) | ORAL | 1 refills | Status: DC

## 2021-11-22 NOTE — Patient Instructions (Signed)
Insulin Degludec Injection What is this medication? INSULIN DEGLUDEC (IN su lin de GLOO dek) treats diabetes. It works by increasing insulin levels in your body, which decreases your blood sugar (glucose). It belongs to a group of medications called long-acting insulins. Changes to diet and exercise are often combined with this medication. This medicine may be used for other purposes; ask your health care provider or pharmacist if you have questions. COMMON BRAND NAME(S): Tyler Aas What should I tell my care team before I take this medication? They need to know if you have any of these conditions: Episodes of low blood sugar Eye disease, vision problems Kidney disease Liver disease An unusual or allergic reaction to insulin, other medications, foods, dyes, or preservatives Pregnant or trying to get pregnant Breast-feeding How should I use this medication? This medication is for injection under the skin. Use exactly as directed. This insulin should never be mixed in the same syringe with other insulins before injection. Do not vigorously shake before use. You will be taught how to use this medication and how to adjust doses for activities and illness. Do not use more insulin than prescribed. Always check the appearance of your insulin before using it. This medication should be clear and colorless like water. Do not use it if it is cloudy, thickened, colored, or has solid particles in it. If you use an insulin pen, be sure to take off the outer needle cover before using the dose. It is important that you put your used needles and syringes in a special sharps container. Do not put them in a trash can. If you do not have a sharps container, call your pharmacist or care team to get one. This medication comes with INSTRUCTIONS FOR USE. Ask your pharmacist for directions on how to use this medication. Read the information carefully. Talk to your pharmacist or care team if you have questions. Talk to your  care team regarding the use of this medication in children. While this medication may be prescribed for children as young as 1 year for selected conditions, precautions do apply. Overdosage: If you think you have taken too much of this medicine contact a poison control center or emergency room at once. NOTE: This medicine is only for you. Do not share this medicine with others. What if I miss a dose? For adults: If you miss a dose, take it as soon as you can. Make sure your next dose is taken at least 8 hours later. Do not take double or extra doses. For adolescents and children: It is important not to miss a dose. Your care team should discuss a plan for missed doses with you. If you do miss a dose, follow their plan. Do not take double doses. What may interact with this medication? Alcohol containing beverages Antiviral medications for HIV or AIDS Aspirin and aspirin-like medications Beta-blockers like atenolol, metoprolol, propranolol Certain medications for blood pressure, heart disease, irregular heart beat Chromium Clonidine Diuretics Female hormones, such as estrogens or progestins, birth control pills Fenofibrate Gemfibrozil Guanethidine Isoniazid Lanreotide Female hormones or anabolic steroids MAOIs like Carbex, Eldepryl, Marplan, Nardil, and Parnate Medications for weight loss Medications for allergies, asthma, cold, or cough Medications for depression, anxiety, or psychotic disturbances Niacin Nicotine NSAIDs, medications for pain and inflammation, like ibuprofen or naproxen Octreotide Other medications for diabetes, like glyburide, glipizide, or glimepiride Pasireotide Pentamidine Phenytoin Probenecid Quinolone antibiotics such as ciprofloxacin, levofloxacin, ofloxacin Reserpine Some herbal dietary supplements Steroid medications such as prednisone or cortisone Sulfamethoxazole; trimethoprim  Thyroid hormones This list may not describe all possible interactions. Give  your health care provider a list of all the medicines, herbs, non-prescription drugs, or dietary supplements you use. Also tell them if you smoke, drink alcohol, or use illegal drugs. Some items may interact with your medicine. What should I watch for while using this medication? Visit your care team for regular checks on your progress. A test called the HbA1C (A1C) will be monitored. This is a simple blood test. It measures your blood sugar control over the last 2 to 3 months. You will receive this test every 3 to 6 months. Learn how to check your blood sugar. Learn the symptoms of low and high blood sugar and how to manage them. Always carry a quick-source of sugar with you in case you have symptoms of low blood sugar. Examples include hard sugar candy or glucose tablets. Make sure others know that you can choke if you eat or drink when you develop serious symptoms of low blood sugar, such as seizures or unconsciousness. They must get medical help at once. Tell your care team if you have high blood sugar. You might need to change the dose of your medication. If you are sick or exercising more than usual, you might need to change the dose of your medication. Do not skip meals. Ask your care team if you should avoid alcohol. Many nonprescription cough and cold products contain sugar or alcohol. These can affect blood sugar. Make sure that you have the right kind of syringe for the type of insulin you use. Try not to change the brand and type of insulin or syringe unless your care team tells you to. Switching insulin brand or type can cause dangerously high or low blood sugar. Always keep an extra supply of insulin, syringes, and needles on hand. Use a syringe one time only. Throw away syringe and needle in a closed container to prevent accidental needle sticks. Insulin pens and cartridges should never be shared. Even if the needle is changed, sharing may result in passing of viruses like hepatitis or  HIV. Each time you get a new box of pen needles, check to see if they are the same type as the ones you were trained to use. If not, ask your care team to show you how to use this new type properly. Wear a medical ID bracelet or chain, and carry a card that describes your disease and details of your medication and dosage times. What side effects may I notice from receiving this medication? Side effects that you should report to your care team as soon as possible: Allergic reactions--skin rash, itching, hives, swelling of the face, lips, tongue, or throat Low blood sugar (hypoglycemia)--tremors or shaking, anxiety, sweating, cold or clammy skin, confusion, dizziness, rapid heartbeat Low potassium level--muscle pain or cramps, unusual weakness or fatigue, fast or irregular heartbeat, constipation Side effects that usually do not require medical attention (report to your care team if they continue or are bothersome): Lipodystrophy--hardening or scarring of tissue at injection site Pain, redness, or irritation at injection site Weight gain This list may not describe all possible side effects. Call your doctor for medical advice about side effects. You may report side effects to FDA at 1-800-FDA-1088. Where should I keep my medication? Keep out of the reach of children and pets. Unopened Vials: Tresiba vials: Store in a refrigerator between 2 and 8 degrees C (36 and 46 degrees F) or at room temperature below 30 degrees C (  86 degrees F). Do not freeze or use if the insulin has been frozen. Protect from light and excessive heat. If stored at room temperature, the vial must be discarded after 56 days (8 weeks). Throw away any unopened and unused medication that has been stored in the refrigerator after the expiration date. Unopened Pens: [place] FlexTouch pens: Store in a refrigerator between 2 and 8 degrees C (36 and 46 degrees F) or at room temperature below 30 degrees C (86 degrees F). Do not freeze or  use if the insulin has been frozen. Protect from light and excessive heat. If stored at room temperature, the pen must be discarded after 56 days (8 weeks). Throw away any unopened and unused medication that has been stored in the refrigerator after the expiration date. Vials that you are using: Tresiba vials: Store in a refrigerator or at room temperature below 30 degrees C (86 degrees F). Do not freeze. Keep away from heat and light. Throw the opened vial away after 56 days (8 weeks). Pens that you are using: [place] FlexTouch pens: Store in a refrigerator or at room temperature below 30 degrees C (86 degrees F). Do not freeze. Keep away from heat and light. Throw the pen away after 56 days (8 weeks), even if it still has insulin left in it. NOTE: This sheet is a summary. It may not cover all possible information. If you have questions about this medicine, talk to your doctor, pharmacist, or health care provider.  2023 Elsevier/Gold Standard (2020-03-26 00:00:00)

## 2021-11-22 NOTE — Telephone Encounter (Signed)
Left message to call back  

## 2021-11-22 NOTE — Progress Notes (Signed)
Subjective:    Patient ID: Emma Griffin, female    DOB: 07-Apr-1950, 71 y.o.   MRN: [id number]   Chief Complaint: medical management of chronic issues     HPI:  Emma Griffin is a 71 y.o. who identifies as a female who was assigned female at birth.   Social history: Lives with: her son Work history: disability   Comes in today for follow up of the following chronic medical issues:  1. Primary hypertension No c/o chest pain, sob or headache. Doesnot check blood pressure at home. BP Readings from Last 3 Encounters:  08/17/21 (!) 142/82  12/20/20 140/76  09/09/20 (!) 152/88     2. Mixed hyperlipidemia Does not watch diet and does no exercise at all. Lab Results  Component Value Date   CHOL 126 08/17/2021   HDL 32 (L) 08/17/2021   LDLCALC 39 08/17/2021   TRIG 377 (H) 08/17/2021   CHOLHDL 3.9 08/17/2021     3. Type 2 diabetes mellitus with hyperglycemia, with long-term current use of insulin (HCC) Fasting blood sugars have been high. She saw clinical pharmacist and they stopped her janumet and started her on ozempic. Ozempic did not work well for her so she strtaed back on janumet last week. She had a day where blood sugars were over 400. She refused to come in office or go to the ED. A telephone visit was done and she was told to give herself 10u of fast acting and see if it would get her blood sugars to start coming down. Blood sugar has come down. This morning it was 233 fasting.  4. Gastroesophageal reflux disease without esophagitis Is on no prescription medication.  5. Peripheral edema Has edema daily. Props her legs up sometimes when sitting.  6. Morbid obesity ([place]) No recent weight changes Wt Readings from Last 3 Encounters:  11/22/21 281 lb (127.5 kg)  08/17/21 285 lb (129.3 kg)  12/20/20 286 lb (129.7 kg)   BMI Readings from Last 3 Encounters:  11/22/21 42.73 kg/m  08/17/21 43.33 kg/m  12/20/20 43.49 kg/m     New complaints: None  today  Allergies  Allergen Reactions   Amlodipine Other (See Comments)    Other reaction(s): Other edema edema   Outpatient Encounter Medications as of 11/22/2021  Medication Sig   ACCU-CHEK GUIDE test strip TEST UP TO 4 TIMES DAILY DX E11.65   Accu-Chek Softclix Lancets lancets TEST 4 TIMES DAILY Dx E11.6   Ascorbic Acid (VITAMIN C) 1000 MG tablet Take 1,000 mg by mouth daily.   aspirin (ASPIRIN LOW DOSE) 81 MG chewable tablet CHEW 1 TABLET BY MOUTH DAILY.   atorvastatin (LIPITOR) 80 MG tablet Take 1 tablet (80 mg total) by mouth daily.   bevacizumab (AVASTIN) 1.25 mg/0.1 mL SOLN Apply 1.25 mg to eye See admin instructions. Opthalmic injection every 4-6 weeks   Calcium Carbonate-Vitamin D ([employer] CALCIUM/VIT D SOFT CHEWS PO) Take 1 tablet by mouth daily.    furosemide (LASIX) 40 MG tablet Take 1 tablet (40 mg total) by mouth daily.   HUMALOG 100 UNIT/ML injection INJECT 40 UNITS (0.4 ML) INTO THE SKIN 3 (THREE) TIMES DAILY BEFORE MEALS.   ibuprofen (ADVIL,MOTRIN) 200 MG tablet Take 400-600 mg by mouth every 6 (six) hours as needed. For pain/cramps   insulin degludec (TRESIBA FLEXTOUCH) 200 UNIT/ML FlexTouch Pen Inject 160 Units into the skin daily.   Insulin Pen Needle (NOVOFINE) 32G X 6 MM MISC Use with insulin pen 4 times daily Dx E11.6  lisinopril (ZESTRIL) 20 MG tablet Take 1 tablet (20 mg total) by mouth 2 (two) times daily.   metoprolol succinate (TOPROL-XL) 100 MG 24 hr tablet Take 1 tablet (100 mg total) by mouth daily. TAKE WITH OR IMMEDIATELY FOLLOWING A MEAL.   Multiple Vitamins-Minerals (AIRBORNE) CHEW Chew 1 tablet by mouth as needed.    nitroGLYCERIN (NITROSTAT) 0.4 MG SL tablet Place 1 tablet (0.4 mg total) under the tongue every 5 (five) minutes as needed for chest pain.   Semaglutide,0.25 or 0.5MG/DOS, 2 MG/3ML SOPN Inject 0.35m into the skin weekly.   No facility-administered encounter medications on file as of 11/22/2021.    Past Surgical History:  Procedure  Laterality Date   CESAREAN SECTION  1987   x 1   EYE SURGERY     weak muscle eye surgery as child   HYSTEROSCOPY WITH D & C  09/20/2011   Procedure: DILATATION AND CURETTAGE /HYSTEROSCOPY;  Surgeon: SAllyn Kenner DO;  [facility];  Service: Gynecology;  Laterality: N/A;  colposcopy   LITHOTRIPSY     kidney stone   REFRACTIVE SURGERY     broken blood vessels behind eyes - bilaterally   right side wisdom teeth ext     still has left wisdom teeth top/botom   TOOTH EXTRACTION     upper and lower back right    Family History  Problem Relation Age of Onset   Diabetes Mother    Hypertension Mother    Atrial fibrillation Mother    Heart attack Father    Diabetes Sister       Controlled substance contract: n/a     Review of Systems  Constitutional:  Negative for diaphoresis.  Eyes:  Negative for pain.  Respiratory:  Negative for shortness of breath.   Cardiovascular:  Negative for chest pain, palpitations and leg swelling.  Gastrointestinal:  Negative for abdominal pain.  Endocrine: Negative for polydipsia.  Skin:  Negative for rash.  Neurological:  Negative for dizziness, weakness and headaches.  Hematological:  Does not bruise/bleed easily.  All other systems reviewed and are negative.      Objective:   Physical Exam Vitals and nursing note reviewed.  Constitutional:      General: She is not in acute distress.    Appearance: Normal appearance. She is well-developed.  HENT:     Head: Normocephalic.     Right Ear: Tympanic membrane normal.     Left Ear: Tympanic membrane normal.     Nose: Nose normal.     Mouth/Throat:     Mouth: Mucous membranes are moist.  Eyes:     Pupils: Pupils are equal, round, and reactive to light.  Neck:     Vascular: No carotid bruit or JVD.  Cardiovascular:     Rate and Rhythm: Normal rate and regular rhythm.     Heart sounds: Normal heart sounds.  Pulmonary:     Effort: Pulmonary effort is normal. No respiratory distress.      Breath sounds: Normal breath sounds. No wheezing or rales.  Chest:     Chest wall: No tenderness.  Abdominal:     General: Bowel sounds are normal. There is no distension or abdominal bruit.     Palpations: Abdomen is soft. There is no hepatomegaly, splenomegaly, mass or pulsatile mass.     Tenderness: There is no abdominal tenderness.  Musculoskeletal:        General: Normal range of motion.     Cervical back: Normal range of motion  and neck supple.  Lymphadenopathy:     Cervical: No cervical adenopathy.  Skin:    General: Skin is warm and dry.  Neurological:     Mental Status: She is alert and oriented to person, place, and time.     Deep Tendon Reflexes: Reflexes are normal and symmetric.  Psychiatric:        Behavior: Behavior normal.        Thought Content: Thought content normal.        Judgment: Judgment normal.     BP (!) 142/78   Pulse 75   Temp 97.7 F (36.5 C) (Temporal)   Resp 20   Ht _0  (1.727 m)   Wt 281 lb (127.5 kg)   SpO2 92%   BMI 42.73 kg/m   Hgba1c 12.5%      Assessment & Plan:   Emma Griffin comes in today with chief complaint of Medical Management of Chronic Issues   Diagnosis and orders addressed:  1. Primary hypertension Low sodium diet - CBC with Differential/Platelet - CMP14+EGFR - lisinopril (ZESTRIL) 20 MG tablet; Take 1 tablet (20 mg total) by mouth 2 (two) times daily.  Dispense: 180 tablet; Refill: 1 - metoprolol succinate (TOPROL-XL) 100 MG 24 hr tablet; Take 1 tablet (100 mg total) by mouth daily. TAKE WITH OR IMMEDIATELY FOLLOWING A MEAL.  Dispense: 90 tablet; Refill: 1  2. Mixed hyperlipidemia Low fat diet - Lipid panel - atorvastatin (LIPITOR) 80 MG tablet; Take 1 tablet (80 mg total) by mouth daily.  Dispense: 90 tablet; Refill: 1  3. Type 2 diabetes mellitus with hyperglycemia, with long-term current use of insulin ([facility]) Back on janumet Increase humalog to 45 u prior to each meal Continue tresiba - Bayer DCA  Hb A1c Waived - Microalbumin / creatinine urine ratio - C-peptide - sitaGLIPtin-metformin (JANUMET) 50-1000 MG tablet; Take 1 tablet by mouth daily.  Dispense: 90 tablet; Refill: 1  4. Gastroesophageal reflux disease without esophagitis Avoid spicy foods Do not eat 2 hours prior to bedtime   5. Peripheral edema Elevate legs when sitting - furosemide (LASIX) 40 MG tablet; Take 1 tablet (40 mg total) by mouth daily.  Dispense: 90 tablet; Refill: 1  6. Morbid obesity ([place]) Discussed diet and exercise for person with BMI >25 Will recheck weight in 3-6 months    Labs pending Health Maintenance reviewed Diet and exercise encouraged  Follow up plan: 1 month    [place], [name]

## 2021-11-22 NOTE — Telephone Encounter (Signed)
Please let patient know: Submitted freestyle libre 3 CGM order to [facility] [phone] (it will not go through at a local pharmacy) Patient will receive a call if approved and to give pricing; she can also call number above to check on cost, etc It should be covered under her benefits at no cost

## 2021-11-23 NOTE — Telephone Encounter (Signed)
R/C on [place]

## 2021-11-24 NOTE — Telephone Encounter (Signed)
Patient notified and verbalized understanding. 

## 2021-12-06 ENCOUNTER — Encounter (INDEPENDENT_AMBULATORY_CARE_PROVIDER_SITE_OTHER): Payer: Medicare PPO | Admitting: Ophthalmology

## 2021-12-06 DIAGNOSIS — H43813 Vitreous degeneration, bilateral: Secondary | ICD-10-CM | POA: Diagnosis not present

## 2021-12-06 DIAGNOSIS — I10 Essential (primary) hypertension: Secondary | ICD-10-CM | POA: Diagnosis not present

## 2021-12-06 DIAGNOSIS — H35033 Hypertensive retinopathy, bilateral: Secondary | ICD-10-CM | POA: Diagnosis not present

## 2021-12-06 DIAGNOSIS — E113513 Type 2 diabetes mellitus with proliferative diabetic retinopathy with macular edema, bilateral: Secondary | ICD-10-CM

## 2021-12-06 DIAGNOSIS — D3132 Benign neoplasm of left choroid: Secondary | ICD-10-CM

## 2021-12-27 ENCOUNTER — Ambulatory Visit: Payer: Medicare PPO | Admitting: Nurse Practitioner

## 2022-01-04 ENCOUNTER — Encounter (INDEPENDENT_AMBULATORY_CARE_PROVIDER_SITE_OTHER): Payer: Medicare PPO | Admitting: Ophthalmology

## 2022-01-04 DIAGNOSIS — I10 Essential (primary) hypertension: Secondary | ICD-10-CM

## 2022-01-04 DIAGNOSIS — H43813 Vitreous degeneration, bilateral: Secondary | ICD-10-CM | POA: Diagnosis not present

## 2022-01-04 DIAGNOSIS — E113513 Type 2 diabetes mellitus with proliferative diabetic retinopathy with macular edema, bilateral: Secondary | ICD-10-CM

## 2022-01-04 DIAGNOSIS — D3132 Benign neoplasm of left choroid: Secondary | ICD-10-CM

## 2022-01-04 DIAGNOSIS — H35033 Hypertensive retinopathy, bilateral: Secondary | ICD-10-CM

## 2022-02-01 ENCOUNTER — Encounter (INDEPENDENT_AMBULATORY_CARE_PROVIDER_SITE_OTHER): Payer: Medicare PPO | Admitting: Ophthalmology

## 2022-02-01 DIAGNOSIS — I10 Essential (primary) hypertension: Secondary | ICD-10-CM

## 2022-02-01 DIAGNOSIS — D3132 Benign neoplasm of left choroid: Secondary | ICD-10-CM

## 2022-02-01 DIAGNOSIS — H43813 Vitreous degeneration, bilateral: Secondary | ICD-10-CM

## 2022-02-01 DIAGNOSIS — E113513 Type 2 diabetes mellitus with proliferative diabetic retinopathy with macular edema, bilateral: Secondary | ICD-10-CM | POA: Diagnosis not present

## 2022-02-01 DIAGNOSIS — H35033 Hypertensive retinopathy, bilateral: Secondary | ICD-10-CM

## 2022-03-08 ENCOUNTER — Encounter (INDEPENDENT_AMBULATORY_CARE_PROVIDER_SITE_OTHER): Payer: Medicare PPO | Admitting: Ophthalmology

## 2022-03-08 DIAGNOSIS — D3132 Benign neoplasm of left choroid: Secondary | ICD-10-CM | POA: Diagnosis not present

## 2022-03-08 DIAGNOSIS — H35033 Hypertensive retinopathy, bilateral: Secondary | ICD-10-CM

## 2022-03-08 DIAGNOSIS — H43813 Vitreous degeneration, bilateral: Secondary | ICD-10-CM

## 2022-03-08 DIAGNOSIS — I10 Essential (primary) hypertension: Secondary | ICD-10-CM | POA: Diagnosis not present

## 2022-03-08 DIAGNOSIS — E113513 Type 2 diabetes mellitus with proliferative diabetic retinopathy with macular edema, bilateral: Secondary | ICD-10-CM | POA: Diagnosis not present

## 2022-03-31 ENCOUNTER — Other Ambulatory Visit: Payer: Self-pay | Admitting: Nurse Practitioner

## 2022-04-12 ENCOUNTER — Encounter (INDEPENDENT_AMBULATORY_CARE_PROVIDER_SITE_OTHER): Payer: Medicare PPO | Admitting: Ophthalmology

## 2022-04-12 DIAGNOSIS — I10 Essential (primary) hypertension: Secondary | ICD-10-CM

## 2022-04-12 DIAGNOSIS — H35033 Hypertensive retinopathy, bilateral: Secondary | ICD-10-CM

## 2022-04-12 DIAGNOSIS — E113513 Type 2 diabetes mellitus with proliferative diabetic retinopathy with macular edema, bilateral: Secondary | ICD-10-CM

## 2022-04-12 DIAGNOSIS — H43813 Vitreous degeneration, bilateral: Secondary | ICD-10-CM

## 2022-04-12 DIAGNOSIS — D3132 Benign neoplasm of left choroid: Secondary | ICD-10-CM | POA: Diagnosis not present

## 2022-04-28 ENCOUNTER — Encounter: Payer: Self-pay | Admitting: Nurse Practitioner

## 2022-05-24 ENCOUNTER — Encounter (INDEPENDENT_AMBULATORY_CARE_PROVIDER_SITE_OTHER): Payer: Medicare PPO | Admitting: Ophthalmology

## 2022-05-24 DIAGNOSIS — I10 Essential (primary) hypertension: Secondary | ICD-10-CM

## 2022-05-24 DIAGNOSIS — H43813 Vitreous degeneration, bilateral: Secondary | ICD-10-CM

## 2022-05-24 DIAGNOSIS — E113513 Type 2 diabetes mellitus with proliferative diabetic retinopathy with macular edema, bilateral: Secondary | ICD-10-CM | POA: Diagnosis not present

## 2022-05-24 DIAGNOSIS — H35033 Hypertensive retinopathy, bilateral: Secondary | ICD-10-CM | POA: Diagnosis not present

## 2022-05-24 DIAGNOSIS — D3132 Benign neoplasm of left choroid: Secondary | ICD-10-CM

## 2022-06-28 ENCOUNTER — Encounter (INDEPENDENT_AMBULATORY_CARE_PROVIDER_SITE_OTHER): Payer: Medicare PPO | Admitting: Ophthalmology

## 2022-06-28 DIAGNOSIS — E113513 Type 2 diabetes mellitus with proliferative diabetic retinopathy with macular edema, bilateral: Secondary | ICD-10-CM | POA: Diagnosis not present

## 2022-06-28 DIAGNOSIS — D3132 Benign neoplasm of left choroid: Secondary | ICD-10-CM

## 2022-06-28 DIAGNOSIS — H35033 Hypertensive retinopathy, bilateral: Secondary | ICD-10-CM | POA: Diagnosis not present

## 2022-06-28 DIAGNOSIS — Z7985 Long-term (current) use of injectable non-insulin antidiabetic drugs: Secondary | ICD-10-CM | POA: Diagnosis not present

## 2022-06-28 DIAGNOSIS — I10 Essential (primary) hypertension: Secondary | ICD-10-CM

## 2022-06-28 DIAGNOSIS — H43813 Vitreous degeneration, bilateral: Secondary | ICD-10-CM | POA: Diagnosis not present

## 2022-07-06 ENCOUNTER — Other Ambulatory Visit: Payer: Self-pay | Admitting: Nurse Practitioner

## 2022-07-06 DIAGNOSIS — E782 Mixed hyperlipidemia: Secondary | ICD-10-CM

## 2022-07-06 DIAGNOSIS — I10 Essential (primary) hypertension: Secondary | ICD-10-CM

## 2022-07-06 DIAGNOSIS — R600 Localized edema: Secondary | ICD-10-CM

## 2022-07-13 ENCOUNTER — Encounter: Payer: Self-pay | Admitting: Nurse Practitioner

## 2022-07-13 ENCOUNTER — Ambulatory Visit: Payer: Medicare PPO | Admitting: Nurse Practitioner

## 2022-07-13 VITALS — BP 142/78 | HR 78 | Ht 68.0 in | Wt 288.0 lb

## 2022-07-13 DIAGNOSIS — Z794 Long term (current) use of insulin: Secondary | ICD-10-CM | POA: Diagnosis not present

## 2022-07-13 DIAGNOSIS — E785 Hyperlipidemia, unspecified: Secondary | ICD-10-CM

## 2022-07-13 DIAGNOSIS — K219 Gastro-esophageal reflux disease without esophagitis: Secondary | ICD-10-CM

## 2022-07-13 DIAGNOSIS — I10 Essential (primary) hypertension: Secondary | ICD-10-CM | POA: Diagnosis not present

## 2022-07-13 DIAGNOSIS — E1169 Type 2 diabetes mellitus with other specified complication: Secondary | ICD-10-CM | POA: Diagnosis not present

## 2022-07-13 DIAGNOSIS — E782 Mixed hyperlipidemia: Secondary | ICD-10-CM | POA: Diagnosis not present

## 2022-07-13 DIAGNOSIS — E1165 Type 2 diabetes mellitus with hyperglycemia: Secondary | ICD-10-CM | POA: Diagnosis not present

## 2022-07-13 DIAGNOSIS — R600 Localized edema: Secondary | ICD-10-CM

## 2022-07-13 DIAGNOSIS — Z6841 Body Mass Index (BMI) 40.0 and over, adult: Secondary | ICD-10-CM

## 2022-07-13 LAB — BAYER DCA HB A1C WAIVED: HB A1C (BAYER DCA - WAIVED): 12.7 % — ABNORMAL HIGH (ref 4.8–5.6)

## 2022-07-13 MED ORDER — METOPROLOL SUCCINATE ER 100 MG PO TB24
100.0000 mg | ORAL_TABLET | Freq: Every day | ORAL | 1 refills | Status: DC
Start: 2022-07-13 — End: 2022-12-28

## 2022-07-13 MED ORDER — LISINOPRIL 20 MG PO TABS
20.0000 mg | ORAL_TABLET | Freq: Two times a day (BID) | ORAL | 1 refills | Status: DC
Start: 2022-07-13 — End: 2022-12-28

## 2022-07-13 MED ORDER — ATORVASTATIN CALCIUM 80 MG PO TABS
80.0000 mg | ORAL_TABLET | Freq: Every day | ORAL | 1 refills | Status: DC
Start: 2022-07-13 — End: 2022-12-28

## 2022-07-13 MED ORDER — INSULIN ASPART 100 UNIT/ML IJ SOLN
46.0000 [IU] | Freq: Three times a day (TID) | INTRAMUSCULAR | 11 refills | Status: DC
Start: 2022-07-13 — End: 2022-07-17

## 2022-07-13 MED ORDER — FUROSEMIDE 40 MG PO TABS
40.0000 mg | ORAL_TABLET | Freq: Every day | ORAL | 1 refills | Status: DC
Start: 2022-07-13 — End: 2022-12-28

## 2022-07-13 MED ORDER — INSULIN PEN NEEDLE 32G X 6 MM MISC
1.0000 | Freq: Four times a day (QID) | 5 refills | Status: DC
Start: 2022-07-13 — End: 2023-10-12

## 2022-07-13 MED ORDER — TRESIBA FLEXTOUCH 200 UNIT/ML ~~LOC~~ SOPN
160.00 [IU] | PEN_INJECTOR | Freq: Every day | SUBCUTANEOUS | 3 refills | Status: DC
Start: 2022-07-13 — End: 2022-09-08

## 2022-07-13 MED ORDER — FREESTYLE LIBRE 3 SENSOR MISC
1.0000 | 5 refills | Status: DC
Start: 2022-07-13 — End: 2022-07-14

## 2022-07-13 MED ORDER — FREESTYLE LIBRE 3 READER DEVI
1.0000 | Freq: Every day | 0 refills | Status: DC
Start: 2022-07-13 — End: 2022-07-14

## 2022-07-13 MED ORDER — ACCU-CHEK GUIDE VI STRP: ORAL_STRIP | 12 refills | Status: AC

## 2022-07-13 MED ORDER — INSULIN ASPART 100 UNIT/ML IJ SOLN
46.0000 [IU] | Freq: Three times a day (TID) | INTRAMUSCULAR | 11 refills | Status: DC
Start: 2022-07-13 — End: 2022-07-13

## 2022-07-13 MED ORDER — JANUMET XR 50-1000 MG PO TB24
1.0000 | ORAL_TABLET | Freq: Every day | ORAL | 1 refills | Status: DC
Start: 2022-07-13 — End: 2022-12-28

## 2022-07-13 MED ORDER — TRESIBA FLEXTOUCH 200 UNIT/ML ~~LOC~~ SOPN
160.0000 [IU] | PEN_INJECTOR | Freq: Every day | SUBCUTANEOUS | 3 refills | Status: DC
Start: 2022-07-13 — End: 2022-07-13

## 2022-07-13 NOTE — Progress Notes (Signed)
Subjective:    Patient ID: Emma Griffin, female    DOB: April 26, 1950, 72 y.o.   MRN: [id number]   Chief Complaint: medical management of chronic issues     HPI:  Emma Griffin is a 72 y.o. who identifies as a female who was assigned female at birth.   Social history: Lives with: son and daughter  in law Work history: disability   Comes in today for follow up of the following chronic medical issues:  1. Primary hypertension No c/o chest pain, sob or headache. Does not check blood pressure at home. BP Readings from Last 3 Encounters:  11/22/21 (!) 142/78  08/17/21 (!) 142/82  12/20/20 140/76     2. Hyperlipidemia associated with type 2 diabetes mellitus (HCC) Does not watch diet and does no exercise. Lab Results  Component Value Date   HGBA1C 12.5 (H) 11/22/2021     3. Type 2 diabetes mellitus with hyperglycemia, with long-term current use of insulin (HCC) Does not check blood sugars every day. Her blood sugars have been running over 300. She saw clinical pharmacist and they started her on ozempic but she could not take it due to severe nausea. She is on jamumet. She was trying  to get CGM but was to expensive. Checks blood sugars 3x a day Lab Results  Component Value Date   HGBA1C 12.5 (H) 11/22/2021     4. Gastroesophageal reflux disease without esophagitis Just uses OTC meds as needed  5. Peripheral edema Has daily edema  6. Morbid obesity (HCC) Weight is down 4lbs Wt Readings from Last 3 Encounters:  11/22/21 281 lb (127.5 kg)  08/17/21 285 lb (129.3 kg)  12/20/20 286 lb (129.7 kg)   BMI Readings from Last 3 Encounters:  07/13/22 42.73 kg/m  11/22/21 42.73 kg/m  08/17/21 43.33 kg/m      New complaints: None today  Allergies  Allergen Reactions   Amlodipine Other (See Comments)    Other reaction(s): Other edema edema   Outpatient Encounter Medications as of 07/13/2022  Medication Sig   ACCU-CHEK GUIDE test strip TEST UP TO 4 TIMES DAILY  DX E11.65   Accu-Chek Softclix Lancets lancets TEST 4 TIMES DAILY Dx E11.6   Ascorbic Acid (VITAMIN C) 1000 MG tablet Take 1,000 mg by mouth daily.   atorvastatin (LIPITOR) 80 MG tablet TAKE 1 TABLET BY MOUTH EVERY DAY   bevacizumab (AVASTIN) 1.25 mg/0.1 mL SOLN Apply 1.25 mg to eye See admin instructions. Opthalmic injection every 4-6 weeks   Calcium Carbonate-Vitamin D ([employer] CALCIUM/VIT D SOFT CHEWS PO) Take 1 tablet by mouth daily.    Continuous Blood Gluc Sensor (FREESTYLE LIBRE 3 SENSOR) MISC by Does not apply route.   [employer]S ASPIRIN ADULT LOW DOSE 81 MG chewable tablet CHEW 1 TABLET BY MOUTH EVERY DAY   furosemide (LASIX) 40 MG tablet TAKE 1 TABLET BY MOUTH EVERY DAY   HUMALOG 100 UNIT/ML injection INJECT 40 UNITS (0.4 ML) INTO THE SKIN 3 (THREE) TIMES DAILY BEFORE MEALS.   ibuprofen (ADVIL,MOTRIN) 200 MG tablet Take 400-600 mg by mouth every 6 (six) hours as needed. For pain/cramps   insulin degludec (TRESIBA FLEXTOUCH) 200 UNIT/ML FlexTouch Pen Inject 160 Units into the skin daily.   Insulin Pen Needle (NOVOFINE) 32G X 6 MM MISC Use with insulin pen 4 times daily Dx E11.6   lisinopril (ZESTRIL) 20 MG tablet Take 1 tablet (20 mg total) by mouth 2 (two) times daily.   metoprolol succinate (TOPROL-XL) 100 MG 24 hr tablet  TAKE 1 TABLET BY MOUTH DAILY. TAKE WITH OR IMMEDIATELY FOLLOWING A MEAL.   Multiple Vitamins-Minerals (AIRBORNE) CHEW Chew 1 tablet by mouth as needed.    nitroGLYCERIN (NITROSTAT) 0.4 MG SL tablet Place 1 tablet (0.4 mg total) under the tongue every 5 (five) minutes as needed for chest pain.   SitaGLIPtin-MetFORMIN HCl (JANUMET XR) 50-1000 MG TB24 Take 1 tablet by mouth daily.   No facility-administered encounter medications on file as of 07/13/2022.    Past Surgical History:  Procedure Laterality Date   CESAREAN SECTION  1987   x 1   EYE SURGERY     weak muscle eye surgery as child   HYSTEROSCOPY WITH D & C  09/20/2011   Procedure: DILATATION AND CURETTAGE /HYSTEROSCOPY;   Surgeon: Philip Aspen, DO;  Location: [facility];  Service: Gynecology;  Laterality: N/A;  colposcopy   LITHOTRIPSY     kidney stone   REFRACTIVE SURGERY     broken blood vessels behind eyes - bilaterally   right side wisdom teeth ext     still has left wisdom teeth top/botom   TOOTH EXTRACTION     upper and lower back right    Family History  Problem Relation Age of Onset   Diabetes Mother    Hypertension Mother    Atrial fibrillation Mother    Heart attack Father    Diabetes Sister       Controlled substance contract: n/a     Review of Systems  Constitutional:  Negative for diaphoresis.  Eyes:  Negative for pain.  Respiratory:  Negative for shortness of breath.   Cardiovascular:  Negative for chest pain, palpitations and leg swelling.  Gastrointestinal:  Negative for abdominal pain.  Endocrine: Negative for polydipsia.  Skin:  Negative for rash.  Neurological:  Negative for dizziness, weakness and headaches.  Hematological:  Does not bruise/bleed easily.  All other systems reviewed and are negative.      Objective:   Physical Exam Vitals and nursing note reviewed.  Constitutional:      General: She is not in acute distress.    Appearance: Normal appearance. She is well-developed.  HENT:     Head: Normocephalic.     Right Ear: Tympanic membrane normal.     Left Ear: Tympanic membrane normal.     Nose: Nose normal.     Mouth/Throat:     Mouth: Mucous membranes are moist.  Eyes:     Pupils: Pupils are equal, round, and reactive to light.  Neck:     Vascular: No carotid bruit or JVD.  Cardiovascular:     Rate and Rhythm: Normal rate and regular rhythm.     Heart sounds: Normal heart sounds.  Pulmonary:     Effort: Pulmonary effort is normal. No respiratory distress.     Breath sounds: Normal breath sounds. No wheezing or rales.  Chest:     Chest wall: No tenderness.  Abdominal:     General: Bowel sounds are normal. There is no distension or abdominal  bruit.     Palpations: Abdomen is soft. There is no hepatomegaly, splenomegaly, mass or pulsatile mass.     Tenderness: There is no abdominal tenderness.  Musculoskeletal:        General: Normal range of motion.     Cervical back: Normal range of motion and neck supple.  Lymphadenopathy:     Cervical: No cervical adenopathy.  Skin:    General: Skin is warm and dry.  Neurological:  Mental Status: She is alert and oriented to person, place, and time.     Deep Tendon Reflexes: Reflexes are normal and symmetric.  Psychiatric:        Behavior: Behavior normal.        Thought Content: Thought content normal.        Judgment: Judgment normal.     BP (!) 142/78   Pulse 78   Ht 5\' 8"  (1.727 m)   Wt 288 lb (130.6 kg)   SpO2 98%   BMI 43.79 kg/m    Hgba1c 12.7%       Assessment & Plan:  Jezabella Matovich comes in today with chief complaint of Medical Management of Chronic Issues, Hypertension, Hyperlipidemia, and Diabetes   Diagnosis and orders addressed:  1. Primary hypertension Low sodium diet - CBC with Differential/Platelet - CMP14+EGFR - Lipid panel - Bayer DCA Hb A1c Waived - lisinopril (ZESTRIL) 20 MG tablet; Take 1 tablet (20 mg total) by mouth 2 (two) times daily.  Dispense: 180 tablet; Refill: 1 - metoprolol succinate (TOPROL-XL) 100 MG 24 hr tablet; Take 1 tablet (100 mg total) by mouth daily. TAKE WITH OR IMMEDIATELY FOLLOWING A MEAL.  Dispense: 90 tablet; Refill: 1  2. Hyperlipidemia associated with type 2 diabetes mellitus (HCC) Low fat diet - CBC with Differential/Platelet - CMP14+EGFR - Lipid panel - Bayer DCA Hb A1c Waived  3. Type 2 diabetes mellitus with hyperglycemia, with long-term current use of insulin (HCC) Stricter carb counting Increase novalog to 46 u per meal Increase tresiba 60 u - CBC with Differential/Platelet - CMP14+EGFR - Lipid panel - Bayer DCA Hb A1c Waived - Continuous Glucose Receiver (FREESTYLE LIBRE 3 READER) DEVI; 1 each by  Does not apply route daily.  Dispense: 1 each; Refill: 0 - Continuous Glucose Sensor (FREESTYLE LIBRE 3 SENSOR) MISC; 1 each by Does not apply route every 14 (fourteen) days.  Dispense: 2 each; Refill: 5 - insulin degludec (TRESIBA FLEXTOUCH) 200 UNIT/ML FlexTouch Pen; Inject 160 Units into the skin daily.  Dispense: 100 mL; Refill: 3 - insulin aspart (NOVOLOG) 100 UNIT/ML injection; Inject 46 Units into the skin 3 (three) times daily with meals.  Dispense: 10 mL; Refill: 11 - SitaGLIPtin-MetFORMIN HCl (JANUMET XR) 50-1000 MG TB24; Take 1 tablet by mouth daily.  Dispense: 90 tablet; Refill: 1 - C-peptide  4. Gastroesophageal reflux disease without esophagitis Avoid spicy foods Do not eat 2 hours prior to bedtime   5. Peripheral edema Elevate legs when sitting - furosemide (LASIX) 40 MG tablet; Take 1 tablet (40 mg total) by mouth daily.  Dispense: 90 tablet; Refill: 1  6. Morbid obesity (HCC) Discussed diet and exercise for person with BMI >25 Will recheck weight in 3-6 months   7. Mixed hyperlipidemia Low fat diet - atorvastatin (LIPITOR) 80 MG tablet; Take 1 tablet (80 mg total) by mouth daily.  Dispense: 90 tablet; Refill: 1   Labs pending Health Maintenance reviewed Diet and exercise encouraged  Follow up plan: 1 month diabetes only   Mary-Margaret Daphine Deutscher, [name]

## 2022-07-13 NOTE — Patient Instructions (Signed)

## 2022-07-14 ENCOUNTER — Telehealth: Payer: Self-pay | Admitting: Pharmacist

## 2022-07-14 DIAGNOSIS — Z794 Long term (current) use of insulin: Secondary | ICD-10-CM

## 2022-07-14 LAB — CBC WITH DIFFERENTIAL/PLATELET
Basophils Absolute: 0.1 10*3/uL (ref 0.0–0.2)
Basos: 1 %
EOS (ABSOLUTE): 0.3 10*3/uL (ref 0.0–0.4)
Eos: 3 %
Hematocrit: 41.6 % (ref 34.0–46.6)
Hemoglobin: 13.4 g/dL (ref 11.1–15.9)
Immature Grans (Abs): 0.1 10*3/uL (ref 0.0–0.1)
Immature Granulocytes: 1 %
Lymphocytes Absolute: 2.6 10*3/uL (ref 0.7–3.1)
Lymphs: 24 %
MCH: 25.8 pg — ABNORMAL LOW (ref 26.6–33.0)
MCHC: 32.2 g/dL (ref 31.5–35.7)
MCV: 80 fL (ref 79–97)
Monocytes Absolute: 0.9 10*3/uL (ref 0.1–0.9)
Monocytes: 8 %
Neutrophils Absolute: 6.9 10*3/uL (ref 1.4–7.0)
Neutrophils: 63 %
Platelets: 224 10*3/uL (ref 150–450)
RBC: 5.19 x10E6/uL (ref 3.77–5.28)
RDW: 14.5 % (ref 11.7–15.4)
WBC: 10.8 10*3/uL (ref 3.4–10.8)

## 2022-07-14 LAB — CMP14+EGFR
ALT: 27 IU/L (ref 0–32)
AST: 26 IU/L (ref 0–40)
Albumin/Globulin Ratio: 1 — ABNORMAL LOW (ref 1.2–2.2)
Albumin: 3.7 g/dL — ABNORMAL LOW (ref 3.8–4.8)
Alkaline Phosphatase: 154 IU/L — ABNORMAL HIGH (ref 44–121)
BUN/Creatinine Ratio: 22 (ref 12–28)
BUN: 17 mg/dL (ref 8–27)
Bilirubin Total: 0.5 mg/dL (ref 0.0–1.2)
CO2: 21 mmol/L (ref 20–29)
Calcium: 9.4 mg/dL (ref 8.7–10.3)
Chloride: 100 mmol/L (ref 96–106)
Creatinine, Ser: 0.79 mg/dL (ref 0.57–1.00)
Globulin, Total: 3.6 g/dL (ref 1.5–4.5)
Glucose: 204 mg/dL — ABNORMAL HIGH (ref 70–99)
Potassium: 4 mmol/L (ref 3.5–5.2)
Sodium: 140 mmol/L (ref 134–144)
Total Protein: 7.3 g/dL (ref 6.0–8.5)
eGFR: 79 mL/min/{1.73_m2} (ref >59)

## 2022-07-14 LAB — LIPID PANEL
Chol/HDL Ratio: 4.8 ratio — ABNORMAL HIGH (ref 0.0–4.4)
Cholesterol, Total: 129 mg/dL (ref 100–199)
HDL: 27 mg/dL — ABNORMAL LOW (ref >39)
LDL Chol Calc (NIH): 31 mg/dL (ref 0–99)
Triglycerides: 507 mg/dL — ABNORMAL HIGH (ref 0–149)
VLDL Cholesterol Cal: 71 mg/dL — ABNORMAL HIGH (ref 5–40)

## 2022-07-14 MED ORDER — FREESTYLE LIBRE 3 READER DEVI: 1.0000 | Freq: Every day | 0 refills | Status: AC

## 2022-07-14 MED ORDER — FREESTYLE LIBRE 3 SENSOR MISC
5 refills | Status: DC
Start: 2022-07-14 — End: 2023-03-23

## 2022-07-14 NOTE — Telephone Encounter (Signed)
    07/14/2022 Name: Emma Griffin MRN: [id number] DOB: 16-Mar-1950   Emma Griffin 3 CGM send to local pharmacy per patient request.  She stated her insurance would pay $0 at the local pharmacy as of 03/06/22.    Patient currently on insulin and her A1c remains uncontrolled at 12.7% as of 07/13/22.  Patient could benefit from GLP1/GIP, however she had severe nausea when we tried Ozempic.  She remains on Janumet.  [facility] is not affordable and does not offer assistance.   Kieth Brightly, PharmD, BCACP Clinical Pharmacist, [facility]

## 2022-07-15 LAB — C-PEPTIDE: C-Peptide: 1.1 ng/mL (ref 1.1–4.4)

## 2022-07-15 LAB — SPECIMEN STATUS REPORT

## 2022-07-17 ENCOUNTER — Telehealth: Payer: Self-pay | Admitting: Nurse Practitioner

## 2022-07-17 MED ORDER — LANCET DEVICE MISC: 1.0000 | Freq: Three times a day (TID) | 0 refills | Status: AC

## 2022-07-17 MED ORDER — LANCETS MISC. MISC: 1.0000 | Freq: Three times a day (TID) | 0 refills | Status: AC

## 2022-07-17 MED ORDER — NOVOLOG FLEXPEN 100 UNIT/ML ~~LOC~~ SOPN: 46.0000 [IU] | PEN_INJECTOR | Freq: Three times a day (TID) | SUBCUTANEOUS | 11 refills | Status: DC

## 2022-07-17 MED ORDER — BLOOD GLUCOSE MONITORING SUPPL DEVI: 1.0000 | Freq: Three times a day (TID) | 0 refills | Status: AC

## 2022-07-17 MED ORDER — BLOOD GLUCOSE TEST VI STRP: 1.0000 | ORAL_STRIP | Freq: Three times a day (TID) | 0 refills | Status: AC

## 2022-07-17 NOTE — Telephone Encounter (Signed)
Sent in prescription for flex pens- I will have mandy send script for glucose onitor when she comes back tomorrow. I always do it wrong!!!

## 2022-07-17 NOTE — Addendum Note (Signed)
Addended by: Ignacia Bayley on: 07/17/2022 04:28 PM   Modules accepted: Orders

## 2022-07-17 NOTE — Telephone Encounter (Signed)
NA

## 2022-07-17 NOTE — Telephone Encounter (Signed)
Novolog was sent in as vials, patient needs the Novolog flex pens sent in instead for a 30 day supply with refills.  She also needs a new blood sugar meter, Accu Chek.

## 2022-07-17 NOTE — Telephone Encounter (Signed)
Patient was seen on 5/9 and she said her medications are messed up. Said she was supposed to switch to [employer] and she needs pens called in. Please call back to advise.

## 2022-07-17 NOTE — Telephone Encounter (Signed)
Meter sent in 

## 2022-07-19 NOTE — Telephone Encounter (Signed)
[facility] does not have rx on file. Can it be called in again? Please call pt when done

## 2022-07-19 NOTE — Telephone Encounter (Signed)
Spoke with patient about flexpen rx. Patient states that pharmacy did not receive. Contacted pharmacy and they assured me that they have the rx for flexpens and pen needles ready to pick up. Contacted patient and advised her of this. Advised patient to contact the office if they still say they do not have rxs. Patient verbalized understanding

## 2022-08-02 ENCOUNTER — Encounter (INDEPENDENT_AMBULATORY_CARE_PROVIDER_SITE_OTHER): Payer: Medicare PPO | Admitting: Ophthalmology

## 2022-08-02 DIAGNOSIS — E113513 Type 2 diabetes mellitus with proliferative diabetic retinopathy with macular edema, bilateral: Secondary | ICD-10-CM | POA: Diagnosis not present

## 2022-08-02 DIAGNOSIS — D3132 Benign neoplasm of left choroid: Secondary | ICD-10-CM | POA: Diagnosis not present

## 2022-08-02 DIAGNOSIS — H43813 Vitreous degeneration, bilateral: Secondary | ICD-10-CM

## 2022-08-02 DIAGNOSIS — Z7985 Long-term (current) use of injectable non-insulin antidiabetic drugs: Secondary | ICD-10-CM | POA: Diagnosis not present

## 2022-08-02 DIAGNOSIS — I10 Essential (primary) hypertension: Secondary | ICD-10-CM

## 2022-08-02 DIAGNOSIS — H35033 Hypertensive retinopathy, bilateral: Secondary | ICD-10-CM

## 2022-08-11 ENCOUNTER — Ambulatory Visit: Payer: Medicare PPO | Admitting: Nurse Practitioner

## 2022-08-25 ENCOUNTER — Encounter: Payer: Self-pay | Admitting: Nurse Practitioner

## 2022-08-25 ENCOUNTER — Ambulatory Visit: Payer: Medicare PPO | Admitting: Nurse Practitioner

## 2022-08-25 VITALS — BP 159/84 | HR 75 | Temp 97.0°F | Resp 20 | Ht 68.0 in | Wt 283.0 lb

## 2022-08-25 DIAGNOSIS — Z794 Long term (current) use of insulin: Secondary | ICD-10-CM | POA: Diagnosis not present

## 2022-08-25 DIAGNOSIS — Z7984 Long term (current) use of oral hypoglycemic drugs: Secondary | ICD-10-CM

## 2022-08-25 DIAGNOSIS — E119 Type 2 diabetes mellitus without complications: Secondary | ICD-10-CM | POA: Diagnosis not present

## 2022-08-25 LAB — BAYER DCA HB A1C WAIVED: HB A1C (BAYER DCA - WAIVED): 11.6 % — ABNORMAL HIGH (ref 4.8–5.6)

## 2022-08-25 NOTE — Progress Notes (Signed)
Subjective:    Patient ID: Emma Griffin, female    DOB: 30-Mar-1950, 72 y.o.   MRN: [id number]   Chief Complaint: diabetes  HPI Patient was seen for follow up on 07/13/22. She is not compliant with diet at all. Her last hgba1c was 12.7%. we increased her novalog to 46u per meal and tresiba to 160u. Since change her blood sugars have been running around 160-350. She says she has really been trying to  work on her diet. She is now on novalog sliding scale and she says novlog has not worked for her in the past. She has a Therapist, art but has been afraid  to put it on.    Patient Active Problem List   Diagnosis Date Noted   Morbid obesity (HCC) 10/08/2017   Type 2 diabetes mellitus with hyperglycemia, with long-term current use of insulin (HCC) 08/08/2016   Gastroesophageal reflux disease without esophagitis 06/28/2015   Peripheral edema 06/28/2015   Hyperlipidemia associated with type 2 diabetes mellitus (HCC) 09/30/2012   HTN (hypertension) 07/06/2010       Review of Systems  Constitutional:  Negative for diaphoresis.  Eyes:  Positive for visual disturbance (seems to be worsening). Negative for pain.  Respiratory:  Negative for shortness of breath.   Cardiovascular:  Negative for chest pain, palpitations and leg swelling.  Gastrointestinal:  Negative for abdominal pain.  Endocrine: Negative for polydipsia.  Skin:  Negative for rash.  Neurological:  Negative for dizziness, weakness and headaches.  Hematological:  Does not bruise/bleed easily.  All other systems reviewed and are negative.      Objective:   Physical Exam Vitals and nursing note reviewed.  Constitutional:      General: She is not in acute distress.    Appearance: Normal appearance. She is well-developed.  Neck:     Vascular: No carotid bruit or JVD.  Cardiovascular:     Rate and Rhythm: Normal rate and regular rhythm.     Heart sounds: Normal heart sounds.  Pulmonary:     Effort: Pulmonary effort is normal. No  respiratory distress.     Breath sounds: Normal breath sounds. No wheezing or rales.  Chest:     Chest wall: No tenderness.  Abdominal:     General: There is no distension or abdominal bruit.     Palpations: There is no hepatomegaly, splenomegaly, mass or pulsatile mass.     Tenderness: There is no abdominal tenderness.  Musculoskeletal:        General: Normal range of motion.     Cervical back: Normal range of motion and neck supple.  Lymphadenopathy:     Cervical: No cervical adenopathy.  Skin:    General: Skin is warm and dry.  Neurological:     Mental Status: She is alert and oriented to person, place, and time.     Deep Tendon Reflexes: Reflexes are normal and symmetric.  Psychiatric:        Behavior: Behavior normal.        Thought Content: Thought content normal.        Judgment: Judgment normal.    BP (!) 159/84   Pulse 75   Temp (!) 97 F (36.1 C) (Temporal)   Resp 20   Ht 5\' 8"  (1.727 m)   Wt 283 lb (128.4 kg)   BMI 43.03 kg/m         Assessment & Plan:   Emma Griffin in today with chief complaint of Diabetes   1. Diabetes mellitus  treated with insulin and oral medication (HCC) Increase tresiba to 170u daily Continue current sliding scale Put libre on patient today Return to office in 2 weeks with libre readings. - Bayer DCA Hb A1c Waived    The above assessment and management plan was discussed with the patient. The patient verbalized understanding of and has agreed to the management plan. Patient is aware to call the clinic if symptoms persist or worsen. Patient is aware when to return to the clinic for a follow-up visit. Patient educated on when it is appropriate to go to the emergency department.   Mary-Margaret Daphine Deutscher, [name]

## 2022-08-25 NOTE — Patient Instructions (Signed)
Continuous Glucose Monitoring, Adult  Continuous glucose monitoring (CGM) is a way to check your blood sugar (or blood glucose) level at any time. A CGM system includes a sensor that attaches to the skin of your belly or arm. The sensor reads the amount of glucose in the fluid between the cells under your skin. Every few minutes throughout the day and night, the sensor wirelessly sends signals to a glucose monitor that records and saves the readings. With most CGM systems, you also need to do a finger stick twice each day to make sure your finger stick reading matches the reading from your CGM device. CGM reduces the number of times you need to do a finger stick throughout the day. CGM lets you to see your glucose levels in real time. This information can help you make decisions about your diet, physical activity, and diabetes medicine. With most monitors, you also need to do a finger stick and check your blood sugar level using a regular glucose monitor before making any medicine decisions. Your health care provider may prescribe a CGM system if: You have type 1 diabetes. You have type 2 diabetes and use insulin. Your diabetes needs to be under tight control. You do not always have awareness of the warning symptoms of low blood sugar. You often have episodes of high blood sugar (hyperglycemia) or low blood sugar (hypoglycemia). Options for CGM systems There are several options for CGM systems. Some include monitors that: You carry or wear. Attach to and control an insulin pump. Send readings to your smartphone, tablet, or health care provider. Set off an alarm if you have low blood sugar or high blood sugar. Work with your health care provider and diabetes educator to find the best option for you. Before you start using a CGM system at home, you will be trained in how to use it. Using a CGM can help you improve the results of your A1C test, which shows your average blood sugar levels for the past 3  months. Using a CGM also can help you avoid hyperglycemia or hypoglycemia. Let your health care provider know if you do not understand how to use your CGM system or have questions. What are the risks? Generally, these devices are safe to use. However, it is possible that the insertion site may become irritated or infected. Tips for using a CGM system: Follow the instructions for your device carefully. Instructions are different for different systems. Make sure you understand how to place the CGM sensors and set up the monitor before you start using it at home. Most devices require that you replace the CGM sensor every 3 to 7 days, depending on the model. Work closely with your health care provider and diabetes educator to learn about your CGM and make sure you are using your CGM system safely and effectively. Do not take baths, swim, or use a hot tub unless your health care provider approves. CGM sensors are usually waterproof and can be left on in the bath or shower. CGM monitors are usually not waterproof. These should be kept dry. Follow these instructions at home: Follow your diabetes treatment plan to keep your blood sugar within your target range. Take action when the CGM alarm alerts you of high or low blood sugar levels. Check your CGM system insertion site every day for signs of infection. Check for: Redness, swelling, or pain. Fluid or blood. Warmth. Pus or a bad smell. Keep all follow-up visits as told by your health care   provider. This is important. Where to find more information [employer], Continuous Glucose Monitoring: [email or web address] Contact a health care provider if: You are not sure how to use your CGM system or how to interpret the readings. You have finger sticks that do not match your monitor readings. Your CGM often alerts you of hyperglycemia or hypoglycemia. You have a fever. Get help right away if: You have signs of infection at your insertion  site. You have symptoms of hyperglycemia or hypoglycemia that do not get better with treatment. Summary Continuous glucose monitoring (CGM) is a way to check your blood sugar (or bloodglucose) level at any time. A CGM system includes a sensor that attaches to the skin of your belly or arm and a monitor to view the readings. CGM lets you to see your blood sugar levels in real time. This information can help you make decisions about your diet, physical activity, and diabetes medicine. Let your health care provider or diabetes educator know if you do not understand how to use your CGM system or have questions. This information is not intended to replace advice given to you by your health care provider. Make sure you discuss any questions you have with your health care provider. Document Revised: 01/23/2022 Document Reviewed: 01/23/2022 Elsevier Patient Education  2024 [employer].

## 2022-09-06 ENCOUNTER — Encounter (INDEPENDENT_AMBULATORY_CARE_PROVIDER_SITE_OTHER): Payer: Medicare PPO | Admitting: Ophthalmology

## 2022-09-06 DIAGNOSIS — Z7985 Long-term (current) use of injectable non-insulin antidiabetic drugs: Secondary | ICD-10-CM

## 2022-09-06 DIAGNOSIS — I10 Essential (primary) hypertension: Secondary | ICD-10-CM | POA: Diagnosis not present

## 2022-09-06 DIAGNOSIS — D3132 Benign neoplasm of left choroid: Secondary | ICD-10-CM

## 2022-09-06 DIAGNOSIS — H35033 Hypertensive retinopathy, bilateral: Secondary | ICD-10-CM | POA: Diagnosis not present

## 2022-09-06 DIAGNOSIS — E113513 Type 2 diabetes mellitus with proliferative diabetic retinopathy with macular edema, bilateral: Secondary | ICD-10-CM

## 2022-09-06 DIAGNOSIS — H43813 Vitreous degeneration, bilateral: Secondary | ICD-10-CM | POA: Diagnosis not present

## 2022-09-08 ENCOUNTER — Ambulatory Visit: Payer: Medicare PPO | Admitting: Nurse Practitioner

## 2022-09-08 ENCOUNTER — Encounter: Payer: Self-pay | Admitting: Nurse Practitioner

## 2022-09-08 VITALS — BP 164/67 | HR 74 | Temp 97.1°F | Resp 20 | Ht 68.0 in | Wt 279.0 lb

## 2022-09-08 DIAGNOSIS — E1165 Type 2 diabetes mellitus with hyperglycemia: Secondary | ICD-10-CM | POA: Diagnosis not present

## 2022-09-08 DIAGNOSIS — Z7984 Long term (current) use of oral hypoglycemic drugs: Secondary | ICD-10-CM | POA: Diagnosis not present

## 2022-09-08 DIAGNOSIS — Z794 Long term (current) use of insulin: Secondary | ICD-10-CM

## 2022-09-08 DIAGNOSIS — E119 Type 2 diabetes mellitus without complications: Secondary | ICD-10-CM

## 2022-09-08 MED ORDER — TRESIBA FLEXTOUCH 200 UNIT/ML ~~LOC~~ SOPN
160.0000 [IU] | PEN_INJECTOR | Freq: Every day | SUBCUTANEOUS | 3 refills | Status: DC
Start: 2022-09-08 — End: 2022-10-10

## 2022-09-08 MED ORDER — NOVOLOG FLEXPEN 100 UNIT/ML ~~LOC~~ SOPN: 46.0000 [IU] | PEN_INJECTOR | Freq: Three times a day (TID) | SUBCUTANEOUS | 1 refills | Status: DC

## 2022-09-08 NOTE — Patient Instructions (Signed)
Continuous Glucose Monitoring, Adult  Continuous glucose monitoring (CGM) is a way to check your blood sugar (or blood glucose) level at any time. A CGM system includes a sensor that attaches to the skin of your belly or arm. The sensor reads the amount of glucose in the fluid between the cells under your skin. Every few minutes throughout the day and night, the sensor wirelessly sends signals to a glucose monitor that records and saves the readings. With most CGM systems, you also need to do a finger stick twice each day to make sure your finger stick reading matches the reading from your CGM device. CGM reduces the number of times you need to do a finger stick throughout the day. CGM lets you to see your glucose levels in real time. This information can help you make decisions about your diet, physical activity, and diabetes medicine. With most monitors, you also need to do a finger stick and check your blood sugar level using a regular glucose monitor before making any medicine decisions. Your health care provider may prescribe a CGM system if: You have type 1 diabetes. You have type 2 diabetes and use insulin. Your diabetes needs to be under tight control. You do not always have awareness of the warning symptoms of low blood sugar. You often have episodes of high blood sugar (hyperglycemia) or low blood sugar (hypoglycemia). Options for CGM systems There are several options for CGM systems. Some include monitors that: You carry or wear. Attach to and control an insulin pump. Send readings to your smartphone, tablet, or health care provider. Set off an alarm if you have low blood sugar or high blood sugar. Work with your health care provider and diabetes educator to find the best option for you. Before you start using a CGM system at home, you will be trained in how to use it. Using a CGM can help you improve the results of your A1C test, which shows your average blood sugar levels for the past 3  months. Using a CGM also can help you avoid hyperglycemia or hypoglycemia. Let your health care provider know if you do not understand how to use your CGM system or have questions. What are the risks? Generally, these devices are safe to use. However, it is possible that the insertion site may become irritated or infected. Tips for using a CGM system: Follow the instructions for your device carefully. Instructions are different for different systems. Make sure you understand how to place the CGM sensors and set up the monitor before you start using it at home. Most devices require that you replace the CGM sensor every 3 to 7 days, depending on the model. Work closely with your health care provider and diabetes educator to learn about your CGM and make sure you are using your CGM system safely and effectively. Do not take baths, swim, or use a hot tub unless your health care provider approves. CGM sensors are usually waterproof and can be left on in the bath or shower. CGM monitors are usually not waterproof. These should be kept dry. Follow these instructions at home: Follow your diabetes treatment plan to keep your blood sugar within your target range. Take action when the CGM alarm alerts you of high or low blood sugar levels. Check your CGM system insertion site every day for signs of infection. Check for: Redness, swelling, or pain. Fluid or blood. Warmth. Pus or a bad smell. Keep all follow-up visits as told by your health care   provider. This is important. Where to find more information National Institutes of Health, Continuous Glucose Monitoring: niddk.nih.gov Contact a health care provider if: You are not sure how to use your CGM system or how to interpret the readings. You have finger sticks that do not match your monitor readings. Your CGM often alerts you of hyperglycemia or hypoglycemia. You have a fever. Get help right away if: You have signs of infection at your insertion  site. You have symptoms of hyperglycemia or hypoglycemia that do not get better with treatment. Summary Continuous glucose monitoring (CGM) is a way to check your blood sugar (or bloodglucose) level at any time. A CGM system includes a sensor that attaches to the skin of your belly or arm and a monitor to view the readings. CGM lets you to see your blood sugar levels in real time. This information can help you make decisions about your diet, physical activity, and diabetes medicine. Let your health care provider or diabetes educator know if you do not understand how to use your CGM system or have questions. This information is not intended to replace advice given to you by your health care provider. Make sure you discuss any questions you have with your health care provider. Document Revised: 01/23/2022 Document Reviewed: 01/23/2022 Elsevier Patient Education  2024 Elsevier Inc.  

## 2022-09-08 NOTE — Progress Notes (Signed)
   Subjective:    Patient ID: Emma Griffin, female    DOB: [birth year], 72 y.o.   MRN: [id number]   Chief Complaint: diabetes  HPI  Patient is coming in today for follow up of her diabetes. We did chronic follow up 2 weeks ago and her hgab1c was elevated. Patient is very non compliant with her meds and diet. We put a libre on her for her to see how it works and to see if it will help her be more compliant. We had her return today to change libre for her and to check her readings.  She LOVES the [place]. She says it makes her more accountable to her diet. She says she is feeling better and has more energy. She has even had a reading as low as 188. [facility]!!!! Patient Active Problem List   Diagnosis Date Noted   Morbid obesity (HCC) 10/08/2017   Type 2 diabetes mellitus with hyperglycemia, with long-term current use of insulin (HCC) 08/08/2016   Gastroesophageal reflux disease without esophagitis 06/28/2015   Peripheral edema 06/28/2015   Hyperlipidemia associated with type 2 diabetes mellitus (HCC) 09/30/2012   HTN (hypertension) 07/06/2010       Review of Systems  Constitutional:  Negative for diaphoresis.  Eyes:  Negative for pain.  Respiratory:  Negative for shortness of breath.   Cardiovascular:  Negative for chest pain, palpitations and leg swelling.  Gastrointestinal:  Negative for abdominal pain.  Endocrine: Negative for polydipsia.  Skin:  Negative for rash.  Neurological:  Negative for dizziness, weakness and headaches.  Hematological:  Does not bruise/bleed easily.  All other systems reviewed and are negative.      Objective:   Physical Exam Vitals reviewed.  Constitutional:      Appearance: Normal appearance. She is obese.  Cardiovascular:     Rate and Rhythm: Normal rate and regular rhythm.     Heart sounds: Normal heart sounds.  Pulmonary:     Effort: Pulmonary effort is normal.     Breath sounds: Normal breath sounds.  Skin:    General: Skin is warm.   Neurological:     General: No focal deficit present.     Mental Status: She is alert and oriented to person, place, and time.  Psychiatric:        Mood and Affect: Mood normal.        Behavior: Behavior normal.    BP (!) 164/67   Pulse 74   Temp (!) 97.1 F (36.2 C) (Temporal)   Resp 20   Ht 5\' 8"  (1.727 m)   Wt 279 lb (126.6 kg)   SpO2 98%   BMI 42.42 kg/m         Assessment & Plan:   Emma Griffin in today with chief complaint of Diabetes   1. Diabetes mellitus treated with insulin and oral medication (HCC) Continue to watch blood sugars Continue to watch diet Follow up in 1 month    The above assessment and management plan was discussed with the patient. The patient verbalized understanding of and has agreed to the management plan. Patient is aware to call the clinic if symptoms persist or worsen. Patient is aware when to return to the clinic for a follow-up visit. Patient educated on when it is appropriate to go to the emergency department.   Mary-Margaret Daphine Deutscher, [name]

## 2022-09-13 ENCOUNTER — Other Ambulatory Visit: Payer: Self-pay | Admitting: Nurse Practitioner

## 2022-10-09 NOTE — Patient Instructions (Signed)
Our records indicate that you are due for your annual mammogram/breast imaging. While there is no way to prevent breast cancer, early detection provides the best opportunity for curing it. For women over the age of 40, the American Cancer Society recommends a yearly clinical breast exam and a yearly mammogram. These practices have saved thousands of lives. We need your help to ensure that you are receiving optimal medical care. Please call the imaging location that has done you previous mammograms. Please remember to list us as your primary care. This helps make sure we receive a report and can update your chart.  Below is the contact information for several local breast imaging centers. You may call the location that works best for you, and they will be happy to assistance in making you an appointment. You do not need an order for a regular screening mammogram. However, if you are having any problems or concerns with you breast area, please let your primary care provider know, and appropriate orders will be placed. Please let our office know if you have any questions or concerns. Or if you need information for another imaging center not on this list or outside of the area. We are commented to working with you on your health care journey.   The mobile unit/bus ([facility]) - they come twice a month to our location.  These appointments can be made through our office or by call The Breast Center  [facility]  [address] [place], [place] [address] Phone [phone]  [facility]  [address]  [place], [place] [address] [phone]  [facility])  [address] [place], [place] [address] [phone]  [facility] [place]  [address] [place] [address] [phone]  [facility]  [address], [address] [place] [address] [phone]  [facility] [place]  [address] , [place] [address] [phone]  [facility] [address] [place], [place] [address] [phone]  [facility] [address]  [address] [place], [place] [address] [phone]  [facility] [address] [place] [address] [phone]     

## 2022-10-10 ENCOUNTER — Ambulatory Visit: Payer: Medicare PPO | Admitting: Nurse Practitioner

## 2022-10-10 ENCOUNTER — Encounter: Payer: Self-pay | Admitting: Nurse Practitioner

## 2022-10-10 VITALS — BP 168/68 | HR 74 | Temp 97.1°F | Resp 20 | Ht 68.0 in | Wt 281.0 lb

## 2022-10-10 DIAGNOSIS — E1165 Type 2 diabetes mellitus with hyperglycemia: Secondary | ICD-10-CM | POA: Diagnosis not present

## 2022-10-10 DIAGNOSIS — Z794 Long term (current) use of insulin: Secondary | ICD-10-CM

## 2022-10-10 LAB — BAYER DCA HB A1C WAIVED: HB A1C (BAYER DCA - WAIVED): 9.5 % — ABNORMAL HIGH (ref 4.8–5.6)

## 2022-10-10 LAB — GLUCOSE HEMOCUE WAIVED: Glu Hemocue Waived: 94 mg/dL (ref 70–99)

## 2022-10-10 MED ORDER — TRESIBA FLEXTOUCH 200 UNIT/ML ~~LOC~~ SOPN
160.0000 [IU] | PEN_INJECTOR | Freq: Every day | SUBCUTANEOUS | 3 refills | Status: DC
Start: 2022-10-10 — End: 2022-12-28

## 2022-10-10 NOTE — Progress Notes (Addendum)
Subjective:    Patient ID: Emma Griffin, female    DOB: 1950/03/08, 72 y.o.   MRN: [id number]   Chief Complaint: diabetes  Diabetes Pertinent negatives for hypoglycemia include no dizziness or headaches. Pertinent negatives for diabetes include no chest pain, no polydipsia and no weakness.    Patient come sin today for follow up of diabetes. She has been a very brittle diabetic in the past. Would not watch diet and would give her insulin how she wanted to. She has recently been doing well. We talked her into using the [place] and that has made her more compliant with her diet. Her last hgba1c was 11.6. which was an improvement from 12.7. her blood sugars have been running around 220-250. Her diet is not very compliant. She has been doing 46u of novalog with each meal  Wt Readings from Last 3 Encounters:  10/10/22 281 lb (127.5 kg)  09/08/22 279 lb (126.6 kg)  08/25/22 283 lb (128.4 kg)    Patient Active Problem List   Diagnosis Date Noted   Morbid obesity (HCC) 10/08/2017   Type 2 diabetes mellitus with hyperglycemia, with long-term current use of insulin (HCC) 08/08/2016   Gastroesophageal reflux disease without esophagitis 06/28/2015   Peripheral edema 06/28/2015   Hyperlipidemia associated with type 2 diabetes mellitus (HCC) 09/30/2012   HTN (hypertension) 07/06/2010       Review of Systems  Constitutional:  Negative for diaphoresis.  Eyes:  Negative for pain.  Respiratory:  Negative for shortness of breath.   Cardiovascular:  Negative for chest pain, palpitations and leg swelling.  Gastrointestinal:  Negative for abdominal pain.  Endocrine: Negative for polydipsia.  Skin:  Negative for rash.  Neurological:  Negative for dizziness, weakness and headaches.  Hematological:  Does not bruise/bleed easily.  All other systems reviewed and are negative.      Objective:   Physical Exam Vitals and nursing note reviewed.  Constitutional:      General: She is not in acute  distress.    Appearance: Normal appearance. She is well-developed.  Neck:     Vascular: No carotid bruit or JVD.  Cardiovascular:     Rate and Rhythm: Normal rate and regular rhythm.     Heart sounds: Normal heart sounds.  Pulmonary:     Effort: Pulmonary effort is normal. No respiratory distress.     Breath sounds: Normal breath sounds. No wheezing or rales.  Chest:     Chest wall: No tenderness.  Abdominal:     General: There is no abdominal bruit.     Palpations: There is no hepatomegaly, splenomegaly or pulsatile mass.  Musculoskeletal:        General: Normal range of motion.     Cervical back: Normal range of motion and neck supple.  Lymphadenopathy:     Cervical: No cervical adenopathy.  Skin:    General: Skin is warm and dry.  Neurological:     Mental Status: She is alert and oriented to person, place, and time.     Deep Tendon Reflexes: Reflexes are normal and symmetric.  Psychiatric:        Behavior: Behavior normal.        Thought Content: Thought content normal.        Judgment: Judgment normal.     BP (!) 168/68   Pulse 74   Temp (!) 97.1 F (36.2 C) (Temporal)   Resp 20   Ht 5\' 8"  (1.727 m)   Wt 281 lb (127.5 kg)  SpO2 94%   BMI 42.73 kg/m   HGBA1c 9.5%  Finger stick blood sugar 94- libre blood sugar 103    Assessment & Plan:   Emma Griffin in today with chief complaint of Diabetes   1. Type 2 diabetes mellitus with hyperglycemia, with long-term current use of insulin (HCC) Added 46u novolog dose to bedtime snack Stricter carb counting Follow up in 2 month - Bayer DCA Hb A1c Waived    The above assessment and management plan was discussed with the patient. The patient verbalized understanding of and has agreed to the management plan. Patient is aware to call the clinic if symptoms persist or worsen. Patient is aware when to return to the clinic for a follow-up visit. Patient educated on when it is appropriate to go to the emergency  department.   Emma Daphine Deutscher, [name]

## 2022-10-11 ENCOUNTER — Encounter (INDEPENDENT_AMBULATORY_CARE_PROVIDER_SITE_OTHER): Payer: Medicare PPO | Admitting: Ophthalmology

## 2022-10-11 DIAGNOSIS — Z7985 Long-term (current) use of injectable non-insulin antidiabetic drugs: Secondary | ICD-10-CM | POA: Diagnosis not present

## 2022-10-11 DIAGNOSIS — H35371 Puckering of macula, right eye: Secondary | ICD-10-CM

## 2022-10-11 DIAGNOSIS — D3132 Benign neoplasm of left choroid: Secondary | ICD-10-CM | POA: Diagnosis not present

## 2022-10-11 DIAGNOSIS — H35033 Hypertensive retinopathy, bilateral: Secondary | ICD-10-CM | POA: Diagnosis not present

## 2022-10-11 DIAGNOSIS — H43813 Vitreous degeneration, bilateral: Secondary | ICD-10-CM | POA: Diagnosis not present

## 2022-10-11 DIAGNOSIS — E113513 Type 2 diabetes mellitus with proliferative diabetic retinopathy with macular edema, bilateral: Secondary | ICD-10-CM | POA: Diagnosis not present

## 2022-10-11 DIAGNOSIS — I10 Essential (primary) hypertension: Secondary | ICD-10-CM

## 2022-10-13 ENCOUNTER — Other Ambulatory Visit: Payer: Self-pay

## 2022-10-13 DIAGNOSIS — Z780 Asymptomatic menopausal state: Secondary | ICD-10-CM

## 2022-11-15 ENCOUNTER — Encounter (INDEPENDENT_AMBULATORY_CARE_PROVIDER_SITE_OTHER): Payer: Medicare PPO | Admitting: Ophthalmology

## 2022-11-15 DIAGNOSIS — I10 Essential (primary) hypertension: Secondary | ICD-10-CM | POA: Diagnosis not present

## 2022-11-15 DIAGNOSIS — E113513 Type 2 diabetes mellitus with proliferative diabetic retinopathy with macular edema, bilateral: Secondary | ICD-10-CM

## 2022-11-15 DIAGNOSIS — D3132 Benign neoplasm of left choroid: Secondary | ICD-10-CM | POA: Diagnosis not present

## 2022-11-15 DIAGNOSIS — Z7985 Long-term (current) use of injectable non-insulin antidiabetic drugs: Secondary | ICD-10-CM

## 2022-11-15 DIAGNOSIS — H43813 Vitreous degeneration, bilateral: Secondary | ICD-10-CM | POA: Diagnosis not present

## 2022-11-15 DIAGNOSIS — Z794 Long term (current) use of insulin: Secondary | ICD-10-CM

## 2022-11-15 DIAGNOSIS — H35033 Hypertensive retinopathy, bilateral: Secondary | ICD-10-CM

## 2022-12-18 ENCOUNTER — Encounter (INDEPENDENT_AMBULATORY_CARE_PROVIDER_SITE_OTHER): Payer: Medicare PPO | Admitting: Ophthalmology

## 2022-12-18 DIAGNOSIS — Z7985 Long-term (current) use of injectable non-insulin antidiabetic drugs: Secondary | ICD-10-CM

## 2022-12-18 DIAGNOSIS — H43813 Vitreous degeneration, bilateral: Secondary | ICD-10-CM

## 2022-12-18 DIAGNOSIS — I10 Essential (primary) hypertension: Secondary | ICD-10-CM

## 2022-12-18 DIAGNOSIS — E113513 Type 2 diabetes mellitus with proliferative diabetic retinopathy with macular edema, bilateral: Secondary | ICD-10-CM | POA: Diagnosis not present

## 2022-12-18 DIAGNOSIS — D3132 Benign neoplasm of left choroid: Secondary | ICD-10-CM | POA: Diagnosis not present

## 2022-12-18 DIAGNOSIS — H35033 Hypertensive retinopathy, bilateral: Secondary | ICD-10-CM | POA: Diagnosis not present

## 2022-12-18 DIAGNOSIS — Z794 Long term (current) use of insulin: Secondary | ICD-10-CM

## 2022-12-22 NOTE — Patient Instructions (Signed)

## 2022-12-28 ENCOUNTER — Ambulatory Visit: Payer: Medicare PPO

## 2022-12-28 ENCOUNTER — Encounter: Payer: Self-pay | Admitting: Nurse Practitioner

## 2022-12-28 ENCOUNTER — Ambulatory Visit: Payer: Medicare PPO | Admitting: Nurse Practitioner

## 2022-12-28 VITALS — BP 152/79 | HR 78 | Temp 97.6°F | Resp 20 | Ht 68.0 in | Wt 279.0 lb

## 2022-12-28 DIAGNOSIS — E782 Mixed hyperlipidemia: Secondary | ICD-10-CM | POA: Diagnosis not present

## 2022-12-28 DIAGNOSIS — E1165 Type 2 diabetes mellitus with hyperglycemia: Secondary | ICD-10-CM | POA: Diagnosis not present

## 2022-12-28 DIAGNOSIS — Z794 Long term (current) use of insulin: Secondary | ICD-10-CM

## 2022-12-28 DIAGNOSIS — R600 Localized edema: Secondary | ICD-10-CM | POA: Diagnosis not present

## 2022-12-28 DIAGNOSIS — I10 Essential (primary) hypertension: Secondary | ICD-10-CM

## 2022-12-28 LAB — BAYER DCA HB A1C WAIVED: HB A1C (BAYER DCA - WAIVED): 9.3 % — ABNORMAL HIGH (ref 4.8–5.6)

## 2022-12-28 MED ORDER — JANUMET XR 50-1000 MG PO TB24: 1.0000 | ORAL_TABLET | Freq: Every day | ORAL | 1 refills | Status: DC

## 2022-12-28 MED ORDER — FUROSEMIDE 40 MG PO TABS: 40.0000 mg | ORAL_TABLET | Freq: Every day | ORAL | 1 refills | Status: DC

## 2022-12-28 MED ORDER — LISINOPRIL 20 MG PO TABS: 20.0000 mg | ORAL_TABLET | Freq: Two times a day (BID) | ORAL | 1 refills | Status: DC

## 2022-12-28 MED ORDER — TRESIBA FLEXTOUCH 200 UNIT/ML ~~LOC~~ SOPN
170.0000 [IU] | PEN_INJECTOR | Freq: Every day | SUBCUTANEOUS | 1 refills | Status: DC
Start: 2022-12-28 — End: 2023-03-23

## 2022-12-28 MED ORDER — ATORVASTATIN CALCIUM 80 MG PO TABS: 80.0000 mg | ORAL_TABLET | Freq: Every day | ORAL | 1 refills | Status: DC

## 2022-12-28 MED ORDER — METOPROLOL SUCCINATE ER 100 MG PO TB24: 100.0000 mg | ORAL_TABLET | Freq: Every day | ORAL | 1 refills | Status: DC

## 2022-12-28 NOTE — Progress Notes (Signed)
Subjective:    Patient ID: Emma Griffin, female    DOB: [birth year], 72 y.o.   MRN: [id number]   Chief Complaint: diabetes  HPI  Patient was seen follow up on 10/10/22. Her hgab1c was 9.6%. we added novolog 46 u at bedtime with snack. Her blood sugars have been averaging 235.  Wt Readings from Last 3 Encounters:  12/28/22 279 lb (126.6 kg)  10/10/22 281 lb (127.5 kg)  09/08/22 279 lb (126.6 kg)    Patient Active Problem List   Diagnosis Date Noted   Morbid obesity (HCC) 10/08/2017   Type 2 diabetes mellitus with hyperglycemia, with long-term current use of insulin (HCC) 08/08/2016   Gastroesophageal reflux disease without esophagitis 06/28/2015   Peripheral edema 06/28/2015   Hyperlipidemia associated with type 2 diabetes mellitus (HCC) 09/30/2012   HTN (hypertension) 07/06/2010       Review of Systems  Constitutional:  Negative for diaphoresis.  Eyes:  Negative for pain.  Respiratory:  Negative for shortness of breath.   Cardiovascular:  Negative for chest pain, palpitations and leg swelling.  Gastrointestinal:  Negative for abdominal pain.  Endocrine: Negative for polydipsia.  Skin:  Negative for rash.  Neurological:  Negative for dizziness, weakness and headaches.  Hematological:  Does not bruise/bleed easily.  All other systems reviewed and are negative.      Objective:   Physical Exam Constitutional:      Appearance: Normal appearance.  Cardiovascular:     Rate and Rhythm: Normal rate and regular rhythm.     Heart sounds: Normal heart sounds.  Pulmonary:     Effort: Pulmonary effort is normal.     Breath sounds: Normal breath sounds.  Neurological:     General: No focal deficit present.     Mental Status: She is alert and oriented to person, place, and time.  Psychiatric:        Mood and Affect: Mood normal.        Behavior: Behavior normal.     BP (!) 152/79   Pulse 78   Temp 97.6 F (36.4 C) (Oral)   Resp 20   Ht 5\' 8"  (1.727 m)   Wt 279 lb  (126.6 kg)   SpO2 96%   BMI 42.42 kg/m   Hgba1c 9.3%      Assessment & Plan:   [name] in today with chief complaint of Medical Management of Chronic Issues   1. Type 2 diabetes mellitus with hyperglycemia, with long-term current use of insulin (HCC) Needs to increase tresiba to 170u nightly Due nightly dose of novalog - Microalbumin / creatinine urine ratio - Bayer DCA Hb A1c Waived - insulin degludec (TRESIBA FLEXTOUCH) 200 UNIT/ML FlexTouch Pen; Inject 170 Units into the skin daily.  Dispense: 84 mL; Refill: 1 - SitaGLIPtin-MetFORMIN HCl (JANUMET XR) 50-1000 MG TB24; Take 1 tablet by mouth daily.  Dispense: 90 tablet; Refill: 1  2. Mixed hyperlipidemia Low fat diet - atorvastatin (LIPITOR) 80 MG tablet; Take 1 tablet (80 mg total) by mouth daily.  Dispense: 90 tablet; Refill: 1  3. Peripheral edema Elevate legs when sitt9ing - furosemide (LASIX) 40 MG tablet; Take 1 tablet (40 mg total) by mouth daily.  Dispense: 90 tablet; Refill: 1  4. Primary hypertension Low sodium diet - lisinopril (ZESTRIL) 20 MG tablet; Take 1 tablet (20 mg total) by mouth 2 (two) times daily.  Dispense: 180 tablet; Refill: 1 - metoprolol succinate (TOPROL-XL) 100 MG 24 hr tablet; Take 1 tablet (100 mg total) by  mouth daily. TAKE WITH OR IMMEDIATELY FOLLOWING A MEAL.  Dispense: 90 tablet; Refill: 1    The above assessment and management plan was discussed with the patient. The patient verbalized understanding of and has agreed to the management plan. Patient is aware to call the clinic if symptoms persist or worsen. Patient is aware when to return to the clinic for a follow-up visit. Patient educated on when it is appropriate to go to the emergency department.   Mary-Margaret Daphine Deutscher, [name]

## 2022-12-29 LAB — MICROALBUMIN / CREATININE URINE RATIO
Creatinine, Urine: 20.3 mg/dL
Microalb/Creat Ratio: 44 mg/g{creat} — ABNORMAL HIGH (ref 0–29)
Microalbumin, Urine: 8.9 ug/mL

## 2023-01-22 ENCOUNTER — Encounter (INDEPENDENT_AMBULATORY_CARE_PROVIDER_SITE_OTHER): Payer: Medicare PPO | Admitting: Ophthalmology

## 2023-01-22 DIAGNOSIS — I10 Essential (primary) hypertension: Secondary | ICD-10-CM

## 2023-01-22 DIAGNOSIS — H33102 Unspecified retinoschisis, left eye: Secondary | ICD-10-CM

## 2023-01-22 DIAGNOSIS — Z794 Long term (current) use of insulin: Secondary | ICD-10-CM | POA: Diagnosis not present

## 2023-01-22 DIAGNOSIS — Z7985 Long-term (current) use of injectable non-insulin antidiabetic drugs: Secondary | ICD-10-CM

## 2023-01-22 DIAGNOSIS — H35033 Hypertensive retinopathy, bilateral: Secondary | ICD-10-CM | POA: Diagnosis not present

## 2023-01-22 DIAGNOSIS — H43813 Vitreous degeneration, bilateral: Secondary | ICD-10-CM

## 2023-01-22 DIAGNOSIS — E113513 Type 2 diabetes mellitus with proliferative diabetic retinopathy with macular edema, bilateral: Secondary | ICD-10-CM | POA: Diagnosis not present

## 2023-02-09 ENCOUNTER — Telehealth: Payer: Self-pay

## 2023-02-09 NOTE — Patient Outreach (Signed)
 [facility] Assistant attempted to call patient on today regarding preventative mammogram screening. No answer from patient after multiple rings. Assistant left confidential voicemail for patient to return call.  Will call back patient back for final attempt.   Emma Griffin [place]/VBCI  [facility] Assistant-Population Health [phone]

## 2023-02-26 ENCOUNTER — Encounter (INDEPENDENT_AMBULATORY_CARE_PROVIDER_SITE_OTHER): Payer: Medicare PPO | Admitting: Ophthalmology

## 2023-02-26 DIAGNOSIS — I10 Essential (primary) hypertension: Secondary | ICD-10-CM

## 2023-02-26 DIAGNOSIS — H43813 Vitreous degeneration, bilateral: Secondary | ICD-10-CM

## 2023-02-26 DIAGNOSIS — Z7985 Long-term (current) use of injectable non-insulin antidiabetic drugs: Secondary | ICD-10-CM

## 2023-02-26 DIAGNOSIS — H35033 Hypertensive retinopathy, bilateral: Secondary | ICD-10-CM

## 2023-02-26 DIAGNOSIS — D3132 Benign neoplasm of left choroid: Secondary | ICD-10-CM

## 2023-02-26 DIAGNOSIS — Z794 Long term (current) use of insulin: Secondary | ICD-10-CM

## 2023-02-26 DIAGNOSIS — E113513 Type 2 diabetes mellitus with proliferative diabetic retinopathy with macular edema, bilateral: Secondary | ICD-10-CM | POA: Diagnosis not present

## 2023-02-26 DIAGNOSIS — H33102 Unspecified retinoschisis, left eye: Secondary | ICD-10-CM

## 2023-02-26 LAB — HM DIABETES EYE EXAM

## 2023-03-23 ENCOUNTER — Ambulatory Visit: Payer: Medicare PPO | Admitting: Nurse Practitioner

## 2023-03-23 ENCOUNTER — Other Ambulatory Visit: Payer: Self-pay

## 2023-03-23 ENCOUNTER — Other Ambulatory Visit: Payer: Medicare PPO

## 2023-03-23 ENCOUNTER — Encounter: Payer: Self-pay | Admitting: Nurse Practitioner

## 2023-03-23 ENCOUNTER — Telehealth: Payer: Self-pay | Admitting: Nurse Practitioner

## 2023-03-23 VITALS — BP 163/79 | HR 77 | Temp 97.6°F | Ht 68.0 in | Wt 286.0 lb

## 2023-03-23 DIAGNOSIS — R600 Localized edema: Secondary | ICD-10-CM

## 2023-03-23 DIAGNOSIS — E1169 Type 2 diabetes mellitus with other specified complication: Secondary | ICD-10-CM

## 2023-03-23 DIAGNOSIS — E785 Hyperlipidemia, unspecified: Secondary | ICD-10-CM | POA: Diagnosis not present

## 2023-03-23 DIAGNOSIS — K219 Gastro-esophageal reflux disease without esophagitis: Secondary | ICD-10-CM | POA: Diagnosis not present

## 2023-03-23 DIAGNOSIS — I10 Essential (primary) hypertension: Secondary | ICD-10-CM | POA: Diagnosis not present

## 2023-03-23 DIAGNOSIS — Z794 Long term (current) use of insulin: Secondary | ICD-10-CM

## 2023-03-23 DIAGNOSIS — E1165 Type 2 diabetes mellitus with hyperglycemia: Secondary | ICD-10-CM

## 2023-03-23 LAB — BAYER DCA HB A1C WAIVED: HB A1C (BAYER DCA - WAIVED): 10.8 % — ABNORMAL HIGH (ref 4.8–5.6)

## 2023-03-23 LAB — LIPID PANEL

## 2023-03-23 MED ORDER — FREESTYLE LIBRE 3 SENSOR MISC: 5 refills | Status: DC

## 2023-03-23 MED ORDER — TRESIBA FLEXTOUCH 200 UNIT/ML ~~LOC~~ SOPN: 176.0000 [IU] | PEN_INJECTOR | Freq: Every day | SUBCUTANEOUS | 1 refills | Status: DC

## 2023-03-23 MED ORDER — CEPHALEXIN 500 MG PO CAPS: 500.0000 mg | ORAL_CAPSULE | Freq: Three times a day (TID) | ORAL | 0 refills | Status: DC

## 2023-03-23 NOTE — Patient Instructions (Signed)
Pressure Injury  A pressure injury, also called a pressure ulcer or bedsore, is an injury to skin and the tissue under the skin that is caused by pressure. It often affects people who must spend a long time in a bed or chair because of a medical condition. Pressure injuries often occur: Over bony parts of the body, such as the tailbone, shoulders, elbows, hips, heels, spine, ankles, and back of the head. Under medical devices that touch the body. These include stockings, equipment to help with breathing, tubes, and splints. Inside the mouth or nose from dentures or tubes. Pressure injuries start as red areas on the skin and can lead to pain and an open wound. What are the causes? This condition is caused by frequent or constant pressure to an area of the body. Less blood flow to the skin can make the tissue die and break down over time, causing a wound. What increases the risk? You are more likely to develop this condition if: You are in the hospital or an extended care facility. You are bedridden or in a wheelchair. You have an injury or disease that keeps you from moving well and feeling pain or pressure. You have a condition that: Makes you sleepy or less alert. Causes poor blood flow. You need to wear a medical device. You have poor control of your bladder or bowel movements (incontinence). You are not getting enough fluid or nutrients (malnutrition). Your health care provider may recommend certain types of mattresses, mattress covers, pillows, cushions, or boots to help prevent a pressure injury. These may include products filled with air, foam, gel, or sand. What are the signs or symptoms? Symptoms of this condition depend on how severe your injury is. Symptoms may include: Red or dark areas of the skin. Pain or a change in skin texture. Your skin may feel warmer, cooler, softer, or firmer. Blisters. An open wound. How is this diagnosed? This condition is diagnosed based on a  medical history and physical exam. You may also have tests, such as: Blood tests. Imaging tests. Blood flow tests. Your injury will be staged based on how severe it is. Staging is based on: How deep the tissue injury is. This includes whether muscle, bone, tendon, or dead tissue is exposed. The cause of the injury. How is this treated? This condition may be treated by: Reducing pressure on your skin. You may need to: Change your position often. Avoid positions that caused the wound or that may make the wound worse. Use certain mattresses, overlays, chair cushions, or protective boots. Move medical devices from an area of pressure, or place padding between the skin and the device. Use foams, creams, or powders to protect your skin from sweat, urine, and stool and reduce rubbing (friction) on the skin. Keeping your skin clean and dry. This may include using a skin cleanser or barrier as told by your health care provider. Cleaning your injury and getting rid of any dead tissue from the wound (debridement). Placing a protective medicine, such as a cream, or bandage (dressing) over your injury. Using medicines for pain or to prevent or treat infection. Surgery may be needed if other treatments are not working or if your injury is very deep. Follow these instructions at home: Medicines Take over-the-counter and prescription medicines only as told by your health care provider. If you were prescribed antibiotics, take or apply them as told by your health care provider. Do not stop using the antibiotic even if you start to   feel better. Eating and drinking Drink enough fluid to keep your urine pale yellow. Eat a healthy diet with lots of protein, as told by your health care provider. Do not use drugs or drink alcohol. Wound care Follow instructions from your health care provider about how to take care of your wound. Make sure you: Wash your hands with soap and water before and after you change  your dressing or apply medicine to your skin. If soap and water are not available, use hand sanitizer. Change your dressing as told by your health care provider. Check your wound every day for signs of infection. Have a caregiver do this for you if you are not able. Check for: Redness, swelling, or more pain. More fluid or blood. Warmth. Pus or a bad smell. Skin care Keep your skin clean and dry. Gently pat your skin dry. Do not rub or massage your skin. Check your skin every day for any changes in color or any new blisters or sores (ulcers). Reducing pressure Do not lie or sit in one position for a long time. Move or change position every 1-2 hours, or as told by your health care provider. Use pillows or cushions to reduce pressure. Ask your health care provider what cushions or pads you should use. General instructions Do not use any products that contain nicotine or tobacco. These products include cigarettes, chewing tobacco, and vaping devices, such as e-cigarettes. If you need help quitting, ask your health care provider. Try to be active every day. Ask your health care provider what exercises or activities are safe for you. Keep all follow-up visits. Your health care provider will check if your injury is healing. Contact a health care provider if: You have a fever or chills. You have pain that does not get better with medicine. Your skin changes color. You have new blisters or sores. You have signs of infection. Your wound does not get better after 1-2 weeks of treatment. This information is not intended to replace advice given to you by your health care provider. Make sure you discuss any questions you have with your health care provider. Document Revised: 08/16/2021 Document Reviewed: 07/22/2021 Elsevier Patient Education  2024 [employer].

## 2023-03-23 NOTE — Telephone Encounter (Signed)
Copied from CRM [id number]. Topic: Clinical - Medication Question >> Mar 23, 2023  4:06 PM Emma Griffin wrote: Reason for CRM: Pt was sen by dr Gennette Pac and was told would be prescribed an antibiotic for 3 times a day. ON Pt after visit summary does not show the antibiotic, pt is requesting a callback [phone]

## 2023-03-23 NOTE — Telephone Encounter (Signed)
Spoke with PCP and sent in Keflex TID for 10 days. Patient notified and verbalized understanding

## 2023-03-23 NOTE — Progress Notes (Signed)
Subjective:    Patient ID: Emma Griffin, female    DOB: Jun 26, 1950, 73 y.o.   MRN: [id number]   Chief Complaint: medical management of chronic issues     HPI:  Emma Griffin is a 73 y.o. who identifies as a female who was assigned female at birth.   Social history: Lives with: son and daughter  in law Work history: disability   Comes in today for follow up of the following chronic medical issues:  1. Primary hypertension No c/o chest pain, sob or headache. Does not check blood pressure at home. BP Readings from Last 3 Encounters:  12/28/22 (!) 152/79  10/10/22 (!) 168/68  09/08/22 (!) 164/67     2. Hyperlipidemia associated with type 2 diabetes mellitus (HCC) Does not watch diet and does no exercise. Lab Results  Component Value Date   CHOL 129 07/13/2022   HDL 27 (L) 07/13/2022   LDLCALC 31 07/13/2022   TRIG 507 (H) 07/13/2022   CHOLHDL 4.8 (H) 07/13/2022      3. Type 2 diabetes mellitus with hyperglycemia, with long-term current use of insulin (HCC) Patient finely agreed on CGM and she loves it. Her blood sugars are running around 180-250. At last visit we increased her tresiba to 170u nightly. Encouraged to do nightly novolog dosing.  Lab Results  Component Value Date   HGBA1C 9.3 (H) 12/28/2022     4. Gastroesophageal reflux disease without esophagitis Just uses OTC meds as needed  5. Peripheral edema Has daily edema  6. Morbid obesity (HCC) Weight is down 4lbs Wt Readings from Last 3 Encounters:  12/28/22 279 lb (126.6 kg)  10/10/22 281 lb (127.5 kg)  09/08/22 279 lb (126.6 kg)   BMI Readings from Last 3 Encounters:  12/28/22 42.42 kg/m  10/10/22 42.73 kg/m  09/08/22 42.42 kg/m      New complaints: None today  Allergies  Allergen Reactions   Amlodipine Other (See Comments)    Other reaction(s): Other edema edema   Ozempic (0.25 Or 0.5 Mg-Dose) [Semaglutide(0.25 Or 0.5mg -Dos)] Nausea Only    Severe nausea   Outpatient  Encounter Medications as of 03/23/2023  Medication Sig   Accu-Chek Softclix Lancets lancets TEST 4 TIMES DAILY Dx E11.6   Ascorbic Acid (VITAMIN C) 1000 MG tablet Take 1,000 mg by mouth daily.   ASPIRIN LOW DOSE 81 MG chewable tablet CHEW 1 TABLET BY MOUTH EVERY DAY   atorvastatin (LIPITOR) 80 MG tablet Take 1 tablet (80 mg total) by mouth daily.   bevacizumab (AVASTIN) 1.25 mg/0.1 mL SOLN Apply 1.25 mg to eye See admin instructions. Opthalmic injection every 4-6 weeks   Blood Glucose Monitoring Suppl DEVI 1 each by Does not apply route in the morning, at noon, and at bedtime. May substitute to any manufacturer covered by patient's insurance.   Calcium Carbonate-Vitamin D ([facility] CALCIUM/VIT D SOFT CHEWS PO) Take 1 tablet by mouth daily.    Continuous Glucose Receiver (FREESTYLE LIBRE 3 READER) DEVI 1 each by Does not apply route daily. Use to test blood sugar continuously. DX: E11.65   Continuous Glucose Sensor (FREESTYLE LIBRE 3 SENSOR) MISC Apply to arm every 14 days to test blood sugar continuously. DX: E11.65   furosemide (LASIX) 40 MG tablet Take 1 tablet (40 mg total) by mouth daily.   glucose blood (ACCU-CHEK GUIDE) test strip Use as instructed   ibuprofen (ADVIL,MOTRIN) 200 MG tablet Take 400-600 mg by mouth every 6 (six) hours as needed. For pain/cramps   insulin aspart (NOVOLOG  FLEXPEN) 100 UNIT/ML FlexPen Inject 46 Units into the skin 3 (three) times daily with meals.   insulin degludec (TRESIBA FLEXTOUCH) 200 UNIT/ML FlexTouch Pen Inject 170 Units into the skin daily.   Insulin Pen Needle 32G X 6 MM MISC 1 each by Does not apply route in the morning, at noon, in the evening, and at bedtime.   lisinopril (ZESTRIL) 20 MG tablet Take 1 tablet (20 mg total) by mouth 2 (two) times daily.   metoprolol succinate (TOPROL-XL) 100 MG 24 hr tablet Take 1 tablet (100 mg total) by mouth daily. TAKE WITH OR IMMEDIATELY FOLLOWING A MEAL.   Multiple Vitamins-Minerals (AIRBORNE) CHEW Chew 1 tablet by  mouth as needed.    nitroGLYCERIN (NITROSTAT) 0.4 MG SL tablet Place 1 tablet (0.4 mg total) under the tongue every 5 (five) minutes as needed for chest pain.   SitaGLIPtin-MetFORMIN HCl (JANUMET XR) 50-1000 MG TB24 Take 1 tablet by mouth daily.   No facility-administered encounter medications on file as of 03/23/2023.    Past Surgical History:  Procedure Laterality Date   CESAREAN SECTION  1987   x 1   EYE SURGERY     weak muscle eye surgery as child   HYSTEROSCOPY WITH D & C  09/20/2011   Procedure: DILATATION AND CURETTAGE /HYSTEROSCOPY;  Surgeon: Philip Aspen, DO;  Location: [facility];  Service: Gynecology;  Laterality: N/A;  colposcopy   LITHOTRIPSY     kidney stone   REFRACTIVE SURGERY     broken blood vessels behind eyes - bilaterally   right side wisdom teeth ext     still has left wisdom teeth top/botom   TOOTH EXTRACTION     upper and lower back right    Family History  Problem Relation Age of Onset   Diabetes Mother    Hypertension Mother    Atrial fibrillation Mother    Heart attack Father    Diabetes Sister       Controlled substance contract: n/a     Review of Systems  Constitutional:  Negative for diaphoresis.  Eyes:  Negative for pain.  Respiratory:  Negative for shortness of breath.   Cardiovascular:  Negative for chest pain, palpitations and leg swelling.  Gastrointestinal:  Negative for abdominal pain.  Endocrine: Negative for polydipsia.  Skin:  Negative for rash.  Neurological:  Negative for dizziness, weakness and headaches.  Hematological:  Does not bruise/bleed easily.  All other systems reviewed and are negative.      Objective:   Physical Exam Vitals and nursing note reviewed.  Constitutional:      General: She is not in acute distress.    Appearance: Normal appearance. She is well-developed.  HENT:     Head: Normocephalic.     Right Ear: Tympanic membrane normal.     Left Ear: Tympanic membrane normal.     Nose: Nose normal.      Mouth/Throat:     Mouth: Mucous membranes are moist.  Eyes:     Pupils: Pupils are equal, round, and reactive to light.  Neck:     Vascular: No carotid bruit or JVD.  Cardiovascular:     Rate and Rhythm: Normal rate and regular rhythm.     Heart sounds: Normal heart sounds.  Pulmonary:     Effort: Pulmonary effort is normal. No respiratory distress.     Breath sounds: Normal breath sounds. No wheezing or rales.  Chest:     Chest wall: No tenderness.  Abdominal:  General: Bowel sounds are normal. There is no distension or abdominal bruit.     Palpations: Abdomen is soft. There is no hepatomegaly, splenomegaly, mass or pulsatile mass.     Tenderness: There is no abdominal tenderness.  Musculoskeletal:        General: Normal range of motion.     Cervical back: Normal range of motion and neck supple.  Lymphadenopathy:     Cervical: No cervical adenopathy.  Skin:    General: Skin is warm and dry.     Comments: Soft slightly tender erythematous lesion on right buttocks  Neurological:     Mental Status: She is alert and oriented to person, place, and time.     Deep Tendon Reflexes: Reflexes are normal and symmetric.  Psychiatric:        Behavior: Behavior normal.        Thought Content: Thought content normal.        Judgment: Judgment normal.     There were no vitals taken for this visit.   Hgba1c 10.8%       Assessment & Plan:  Emma Griffin comes in today with chief complaint of No chief complaint on file.   Diagnosis and orders addressed:  1. Primary hypertension Low sodium diet - CBC with Differential/Platelet - CMP14+EGFR - Lipid panel - Bayer DCA Hb A1c Waived - lisinopril (ZESTRIL) 20 MG tablet; Take 1 tablet (20 mg total) by mouth 2 (two) times daily.  Dispense: 180 tablet; Refill: 1 - metoprolol succinate (TOPROL-XL) 100 MG 24 hr tablet; Take 1 tablet (100 mg total) by mouth daily. TAKE WITH OR IMMEDIATELY FOLLOWING A MEAL.  Dispense: 90 tablet;  Refill: 1  2. Hyperlipidemia associated with type 2 diabetes mellitus (HCC) Low fat diet - CBC with Differential/Platelet - CMP14+EGFR - Lipid panel - Bayer DCA Hb A1c Waived  3. Type 2 diabetes mellitus with hyperglycemia, with long-term current use of insulin (HCC) Stricter carb counting Increase novalog to 50 u per meal Increase tresiba 176 u - CBC with Differential/Platelet - CMP14+EGFR - Lipid panel - Bayer DCA Hb A1c Waived - Continuous Glucose Receiver (FREESTYLE LIBRE 3 READER) DEVI; 1 each by Does not apply route daily.  Dispense: 1 each; Refill: 0 - Continuous Glucose Sensor (FREESTYLE LIBRE 3 SENSOR) MISC; 1 each by Does not apply route every 14 (fourteen) days.  Dispense: 2 each; Refill: 5 - insulin degludec (TRESIBA FLEXTOUCH) 200 UNIT/ML FlexTouch Pen; Inject 160 Units into the skin daily.  Dispense: 100 mL; Refill: 3 - insulin aspart (NOVOLOG) 100 UNIT/ML injection; Inject 46 Units into the skin 3 (three) times daily with meals.  Dispense: 10 mL; Refill: 11 - SitaGLIPtin-MetFORMIN HCl (JANUMET XR) 50-1000 MG TB24; Take 1 tablet by mouth daily.  Dispense: 90 tablet; Refill: 1 - C-peptide  4. Gastroesophageal reflux disease without esophagitis Avoid spicy foods Do not eat 2 hours prior to bedtime   5. Peripheral edema Elevate legs when sitting - furosemide (LASIX) 40 MG tablet; Take 1 tablet (40 mg total) by mouth daily.  Dispense: 90 tablet; Refill: 1  6. Morbid obesity (HCC) Discussed diet and exercise for person with BMI >25 Will recheck weight in 3-6 months   7. Mixed hyperlipidemia Low fat diet - atorvastatin (LIPITOR) 80 MG tablet; Take 1 tablet (80 mg total) by mouth daily.  Dispense: 90 tablet; Refill: 1  8. Decubicus right buttocks Keflex 500mg  tid for 10 days Warm soaks RTO prn  Referral to cardiology Labs pending Health Maintenance reviewed Diet  and exercise encouraged  Follow up plan: 3 months   Emma Daphine Deutscher, [name]

## 2023-03-24 LAB — CMP14+EGFR
ALT: 55 IU/L — ABNORMAL HIGH (ref 0–32)
AST: 40 IU/L (ref 0–40)
Albumin: 3.9 g/dL (ref 3.8–4.8)
Alkaline Phosphatase: 161 IU/L — ABNORMAL HIGH (ref 44–121)
BUN/Creatinine Ratio: 20 (ref 12–28)
BUN: 20 mg/dL (ref 8–27)
Bilirubin Total: 0.5 mg/dL (ref 0.0–1.2)
CO2: 24 mmol/L (ref 20–29)
Calcium: 9.4 mg/dL (ref 8.7–10.3)
Chloride: 102 mmol/L (ref 96–106)
Creatinine, Ser: 1.02 mg/dL — ABNORMAL HIGH (ref 0.57–1.00)
Globulin, Total: 3.4 g/dL (ref 1.5–4.5)
Glucose: 153 mg/dL — ABNORMAL HIGH (ref 70–99)
Potassium: 4.2 mmol/L (ref 3.5–5.2)
Sodium: 142 mmol/L (ref 134–144)
Total Protein: 7.3 g/dL (ref 6.0–8.5)
eGFR: 58 mL/min/{1.73_m2} — ABNORMAL LOW (ref >59)

## 2023-03-24 LAB — CBC WITH DIFFERENTIAL/PLATELET
Basophils Absolute: 0.1 10*3/uL (ref 0.0–0.2)
Basos: 1 %
EOS (ABSOLUTE): 0.3 10*3/uL (ref 0.0–0.4)
Eos: 3 %
Hematocrit: 39.9 % (ref 34.0–46.6)
Hemoglobin: 13 g/dL (ref 11.1–15.9)
Immature Grans (Abs): 0.1 10*3/uL (ref 0.0–0.1)
Immature Granulocytes: 1 %
Lymphocytes Absolute: 3.1 10*3/uL (ref 0.7–3.1)
Lymphs: 28 %
MCH: 26.3 pg — ABNORMAL LOW (ref 26.6–33.0)
MCHC: 32.6 g/dL (ref 31.5–35.7)
MCV: 81 fL (ref 79–97)
Monocytes Absolute: 1 10*3/uL — ABNORMAL HIGH (ref 0.1–0.9)
Monocytes: 9 %
Neutrophils Absolute: 6.8 10*3/uL (ref 1.4–7.0)
Neutrophils: 58 %
Platelets: 209 10*3/uL (ref 150–450)
RBC: 4.95 x10E6/uL (ref 3.77–5.28)
RDW: 14.8 % (ref 11.7–15.4)
WBC: 11.3 10*3/uL — ABNORMAL HIGH (ref 3.4–10.8)

## 2023-03-24 LAB — LIPID PANEL
Cholesterol, Total: 129 mg/dL (ref 100–199)
HDL: 29 mg/dL — ABNORMAL LOW (ref >39)
LDL CALC COMMENT:: 4.4 ratio (ref 0.0–4.4)
LDL Chol Calc (NIH): 42 mg/dL (ref 0–99)
Triglycerides: 391 mg/dL — ABNORMAL HIGH (ref 0–149)
VLDL Cholesterol Cal: 58 mg/dL — ABNORMAL HIGH (ref 5–40)

## 2023-03-26 ENCOUNTER — Ambulatory Visit: Payer: Self-pay | Admitting: Nurse Practitioner

## 2023-03-26 NOTE — Telephone Encounter (Signed)
This RN returned patient's call from earlier this afternoon. Patient stated she was seen in the office recently for a pressure ulcer and had additional questions on how to treat area. This RN advised patient to keep area clean and dry and to change the dressing as needed. Patient inquired about applying heat and this RN advised patient to apply heat over the dressing, rather than directly onto the wound itself. Patient also requested that the office call her cardiologist (Dr. Gwendalyn Ege) to schedule an appointment to obtain cardiac clearance for an eye surgery she may need in the future. This RN advised the patient to call the cardiologist office herself first. Advised patient to call back if she has additional questions.   Copied from CRM [id number]. Topic: Clinical - Medication Question >> Mar 26, 2023  2:19 PM Ivette P wrote: Reason for CRM: Pt called in to say that she is getting a reaction from the [facility] derm patch. Requesting callback [phone] Reason for Disposition  General information question, no triage required and triager able to answer question  Protocols used: Information Only Call - No Triage-A-AH

## 2023-04-01 ENCOUNTER — Other Ambulatory Visit: Payer: Self-pay | Admitting: Nurse Practitioner

## 2023-04-02 ENCOUNTER — Encounter (INDEPENDENT_AMBULATORY_CARE_PROVIDER_SITE_OTHER): Payer: Medicare PPO | Admitting: Ophthalmology

## 2023-04-04 ENCOUNTER — Encounter (INDEPENDENT_AMBULATORY_CARE_PROVIDER_SITE_OTHER): Payer: Medicare PPO | Admitting: Ophthalmology

## 2023-04-04 DIAGNOSIS — D3132 Benign neoplasm of left choroid: Secondary | ICD-10-CM

## 2023-04-04 DIAGNOSIS — Z794 Long term (current) use of insulin: Secondary | ICD-10-CM

## 2023-04-04 DIAGNOSIS — H43813 Vitreous degeneration, bilateral: Secondary | ICD-10-CM | POA: Diagnosis not present

## 2023-04-04 DIAGNOSIS — Z7985 Long-term (current) use of injectable non-insulin antidiabetic drugs: Secondary | ICD-10-CM | POA: Diagnosis not present

## 2023-04-04 DIAGNOSIS — I10 Essential (primary) hypertension: Secondary | ICD-10-CM

## 2023-04-04 DIAGNOSIS — E113513 Type 2 diabetes mellitus with proliferative diabetic retinopathy with macular edema, bilateral: Secondary | ICD-10-CM

## 2023-04-04 DIAGNOSIS — H33102 Unspecified retinoschisis, left eye: Secondary | ICD-10-CM

## 2023-04-04 DIAGNOSIS — H35033 Hypertensive retinopathy, bilateral: Secondary | ICD-10-CM

## 2023-04-20 ENCOUNTER — Telehealth: Payer: Self-pay

## 2023-04-20 MED ORDER — VALACYCLOVIR HCL 1 G PO TABS: 1000.0000 mg | ORAL_TABLET | Freq: Three times a day (TID) | ORAL | 0 refills | Status: AC

## 2023-04-20 NOTE — Telephone Encounter (Signed)
Copied from CRM [id number]. Topic: General - Other >> Apr 20, 2023  3:05 PM Lovey Newcomer R wrote: Reason for CRM: Patient says she has questions to ask but not specifying what they are. Please have Marchelle Folks return her call when available.

## 2023-04-20 NOTE — Telephone Encounter (Signed)
REPORTS SKIN SORE TO TOUCH, STATES WAS PRESCRIBED VALTREX IN PAST FOR SUSPECTED SHINGLES, WILL YOU SEND THIS IN AGAIN FOR HER, [facility] [place]

## 2023-04-20 NOTE — Addendum Note (Signed)
Addended by: Bennie Pierini on: 04/20/2023 04:31 PM   Modules accepted: Orders

## 2023-05-02 ENCOUNTER — Encounter: Payer: Self-pay | Admitting: *Deleted

## 2023-05-09 ENCOUNTER — Encounter (INDEPENDENT_AMBULATORY_CARE_PROVIDER_SITE_OTHER): Payer: Medicare PPO | Admitting: Ophthalmology

## 2023-05-14 ENCOUNTER — Encounter (INDEPENDENT_AMBULATORY_CARE_PROVIDER_SITE_OTHER): Admitting: Ophthalmology

## 2023-05-14 DIAGNOSIS — H33102 Unspecified retinoschisis, left eye: Secondary | ICD-10-CM | POA: Diagnosis not present

## 2023-05-14 DIAGNOSIS — H43813 Vitreous degeneration, bilateral: Secondary | ICD-10-CM

## 2023-05-14 DIAGNOSIS — I10 Essential (primary) hypertension: Secondary | ICD-10-CM | POA: Diagnosis not present

## 2023-05-14 DIAGNOSIS — Z7985 Long-term (current) use of injectable non-insulin antidiabetic drugs: Secondary | ICD-10-CM | POA: Diagnosis not present

## 2023-05-14 DIAGNOSIS — E113513 Type 2 diabetes mellitus with proliferative diabetic retinopathy with macular edema, bilateral: Secondary | ICD-10-CM

## 2023-05-14 DIAGNOSIS — H35033 Hypertensive retinopathy, bilateral: Secondary | ICD-10-CM | POA: Diagnosis not present

## 2023-05-14 DIAGNOSIS — Z794 Long term (current) use of insulin: Secondary | ICD-10-CM | POA: Diagnosis not present

## 2023-06-13 DIAGNOSIS — I10 Essential (primary) hypertension: Secondary | ICD-10-CM | POA: Diagnosis not present

## 2023-06-13 DIAGNOSIS — Z1330 Encounter for screening examination for mental health and behavioral disorders, unspecified: Secondary | ICD-10-CM | POA: Diagnosis not present

## 2023-06-18 DIAGNOSIS — Z79899 Other long term (current) drug therapy: Secondary | ICD-10-CM | POA: Diagnosis not present

## 2023-06-18 DIAGNOSIS — I252 Old myocardial infarction: Secondary | ICD-10-CM | POA: Diagnosis not present

## 2023-06-18 DIAGNOSIS — Z794 Long term (current) use of insulin: Secondary | ICD-10-CM | POA: Diagnosis not present

## 2023-06-18 DIAGNOSIS — I10 Essential (primary) hypertension: Secondary | ICD-10-CM | POA: Diagnosis not present

## 2023-06-18 DIAGNOSIS — E119 Type 2 diabetes mellitus without complications: Secondary | ICD-10-CM | POA: Diagnosis not present

## 2023-06-18 DIAGNOSIS — I2582 Chronic total occlusion of coronary artery: Secondary | ICD-10-CM | POA: Diagnosis not present

## 2023-06-18 DIAGNOSIS — Z955 Presence of coronary angioplasty implant and graft: Secondary | ICD-10-CM | POA: Diagnosis not present

## 2023-06-18 DIAGNOSIS — E785 Hyperlipidemia, unspecified: Secondary | ICD-10-CM | POA: Diagnosis not present

## 2023-06-18 DIAGNOSIS — I25119 Atherosclerotic heart disease of native coronary artery with unspecified angina pectoris: Secondary | ICD-10-CM | POA: Diagnosis not present

## 2023-06-18 DIAGNOSIS — Z7982 Long term (current) use of aspirin: Secondary | ICD-10-CM | POA: Diagnosis not present

## 2023-06-18 DIAGNOSIS — Z7984 Long term (current) use of oral hypoglycemic drugs: Secondary | ICD-10-CM | POA: Diagnosis not present

## 2023-06-21 ENCOUNTER — Ambulatory Visit: Payer: Medicare PPO | Admitting: Nurse Practitioner

## 2023-06-27 ENCOUNTER — Encounter (INDEPENDENT_AMBULATORY_CARE_PROVIDER_SITE_OTHER): Admitting: Ophthalmology

## 2023-06-27 DIAGNOSIS — E113513 Type 2 diabetes mellitus with proliferative diabetic retinopathy with macular edema, bilateral: Secondary | ICD-10-CM

## 2023-06-27 DIAGNOSIS — H35033 Hypertensive retinopathy, bilateral: Secondary | ICD-10-CM | POA: Diagnosis not present

## 2023-06-27 DIAGNOSIS — H33102 Unspecified retinoschisis, left eye: Secondary | ICD-10-CM | POA: Diagnosis not present

## 2023-06-27 DIAGNOSIS — Z794 Long term (current) use of insulin: Secondary | ICD-10-CM

## 2023-06-27 DIAGNOSIS — D3132 Benign neoplasm of left choroid: Secondary | ICD-10-CM | POA: Diagnosis not present

## 2023-06-27 DIAGNOSIS — I10 Essential (primary) hypertension: Secondary | ICD-10-CM | POA: Diagnosis not present

## 2023-06-27 DIAGNOSIS — Z7985 Long-term (current) use of injectable non-insulin antidiabetic drugs: Secondary | ICD-10-CM | POA: Diagnosis not present

## 2023-06-27 DIAGNOSIS — H43813 Vitreous degeneration, bilateral: Secondary | ICD-10-CM | POA: Diagnosis not present

## 2023-06-28 ENCOUNTER — Ambulatory Visit: Payer: Medicare PPO | Admitting: Nurse Practitioner

## 2023-06-28 ENCOUNTER — Encounter: Payer: Self-pay | Admitting: Nurse Practitioner

## 2023-06-28 VITALS — BP 172/82 | HR 68 | Temp 97.4°F | Ht 68.0 in | Wt 291.0 lb

## 2023-06-28 DIAGNOSIS — Z6841 Body Mass Index (BMI) 40.0 and over, adult: Secondary | ICD-10-CM

## 2023-06-28 DIAGNOSIS — K219 Gastro-esophageal reflux disease without esophagitis: Secondary | ICD-10-CM

## 2023-06-28 DIAGNOSIS — E1169 Type 2 diabetes mellitus with other specified complication: Secondary | ICD-10-CM

## 2023-06-28 DIAGNOSIS — R600 Localized edema: Secondary | ICD-10-CM

## 2023-06-28 DIAGNOSIS — E785 Hyperlipidemia, unspecified: Secondary | ICD-10-CM | POA: Diagnosis not present

## 2023-06-28 DIAGNOSIS — E1165 Type 2 diabetes mellitus with hyperglycemia: Secondary | ICD-10-CM

## 2023-06-28 DIAGNOSIS — I10 Essential (primary) hypertension: Secondary | ICD-10-CM | POA: Diagnosis not present

## 2023-06-28 DIAGNOSIS — Z0000 Encounter for general adult medical examination without abnormal findings: Secondary | ICD-10-CM

## 2023-06-28 DIAGNOSIS — Z0001 Encounter for general adult medical examination with abnormal findings: Secondary | ICD-10-CM

## 2023-06-28 DIAGNOSIS — Z794 Long term (current) use of insulin: Secondary | ICD-10-CM

## 2023-06-28 LAB — BAYER DCA HB A1C WAIVED: HB A1C (BAYER DCA - WAIVED): 10.1 % — ABNORMAL HIGH (ref 4.8–5.6)

## 2023-06-28 LAB — LIPID PANEL

## 2023-06-28 NOTE — Progress Notes (Signed)
 Subjective:    Patient ID: Emma Griffin, female    DOB: 11/20/1950, 73 y.o.   MRN: [id number]   Chief Complaint: medical management of chronic issues     HPI:  Emma Griffin is a 73 y.o. who identifies as a female who was assigned female at birth.   Social history: Lives with: son and daughter  in law Work history: disability   Comes in today for follow up of the following chronic medical issues:  1. Primary hypertension No c/o chest pain, sob or headache. Does not check blood pressure at home. BP Readings from Last 3 Encounters:  03/23/23 (!) 163/79  12/28/22 (!) 152/79  10/10/22 (!) 168/68     2. Hyperlipidemia associated with type 2 diabetes mellitus (HCC) Does not watch diet and does no exercise. Lab Results  Component Value Date   CHOL 129 03/23/2023   HDL 29 (L) 03/23/2023   LDLCALC 42 03/23/2023   TRIG 391 (H) 03/23/2023   CHOLHDL 4.4 03/23/2023      3. Type 2 diabetes mellitus with hyperglycemia, with long-term current use of insulin  [facility]) Patient finely agreed on CGM and she loves it. Her blood sugars are running around 180-250. At last visit we increased her tresiba  to 170u nightly. Encouraged to do nightly novolog  dosing. She stopped her janumet  because she was having abdominal pain. Lab Results  Component Value Date   HGBA1C 10.8 (H) 03/23/2023     4. Gastroesophageal reflux disease without esophagitis Just uses OTC meds as needed  5. Peripheral edema Has daily edema  6. Morbid obesity (HCC) Weight is up 5lbs  Wt Readings from Last 3 Encounters:  06/28/23 291 lb (132 kg)  03/23/23 286 lb (129.7 kg)  12/28/22 279 lb (126.6 kg)   BMI Readings from Last 3 Encounters:  06/28/23 44.25 kg/m  03/23/23 43.49 kg/m  12/28/22 42.42 kg/m       New complaints: None today  Allergies  Allergen Reactions   Amlodipine Other (See Comments)    Other reaction(s): Other edema edema   Ozempic  (0.25 Or 0.5 Mg-Dose) [Semaglutide (0.25 Or  0.5mg -Dos)] Nausea Only    Severe nausea   Outpatient Encounter Medications as of 06/28/2023  Medication Sig   Accu-Chek Softclix Lancets lancets TEST 4 TIMES DAILY Dx E11.6   Ascorbic Acid (VITAMIN C) 1000 MG tablet Take 1,000 mg by mouth daily.   ASPIRIN  LOW DOSE 81 MG chewable tablet CHEW 1 TABLET BY MOUTH EVERY DAY   atorvastatin  (LIPITOR) 80 MG tablet Take 1 tablet (80 mg total) by mouth daily.   bevacizumab  (AVASTIN ) 1.25 mg/0.1 mL SOLN Apply 1.25 mg to eye See admin instructions. Opthalmic injection every 4-6 weeks   Blood Glucose Monitoring Suppl DEVI 1 each by Does not apply route in the morning, at noon, and at bedtime. May substitute to any manufacturer covered by patient's insurance.   Calcium  Carbonate-Vitamin D ([facility] CALCIUM /VIT D SOFT CHEWS PO) Take 1 tablet by mouth daily.    cephALEXin  (KEFLEX ) 500 MG capsule Take 1 capsule (500 mg total) by mouth 3 (three) times daily.   Continuous Glucose Receiver (FREESTYLE LIBRE 3 READER) DEVI 1 each by Does not apply route daily. Use to test blood sugar continuously. DX: E11.65   Continuous Glucose Sensor (FREESTYLE LIBRE 3 SENSOR) MISC Apply to arm every 14 days to test blood sugar continuously. DX: E11.65   furosemide  (LASIX ) 40 MG tablet Take 1 tablet (40 mg total) by mouth daily.   glucose blood (ACCU-CHEK GUIDE) test  strip Use as instructed   ibuprofen (ADVIL,MOTRIN) 200 MG tablet Take 400-600 mg by mouth every 6 (six) hours as needed. For pain/cramps   insulin  aspart (NOVOLOG  FLEXPEN) 100 UNIT/ML FlexPen Inject 46 Units into the skin 3 (three) times daily with meals.   insulin  degludec (TRESIBA  FLEXTOUCH) 200 UNIT/ML FlexTouch Pen Inject 176 Units into the skin daily.   Insulin  Pen Needle 32G X 6 MM MISC 1 each by Does not apply route in the morning, at noon, in the evening, and at bedtime.   lisinopril  (ZESTRIL ) 20 MG tablet Take 1 tablet (20 mg total) by mouth 2 (two) times daily.   metoprolol  succinate (TOPROL -XL) 100 MG 24 hr  tablet Take 1 tablet (100 mg total) by mouth daily. TAKE WITH OR IMMEDIATELY FOLLOWING A MEAL.   Multiple Vitamins-Minerals (AIRBORNE) CHEW Chew 1 tablet by mouth as needed.    nitroGLYCERIN  (NITROSTAT ) 0.4 MG SL tablet Place 1 tablet (0.4 mg total) under the tongue every 5 (five) minutes as needed for chest pain.   SitaGLIPtin -MetFORMIN  HCl (JANUMET  XR) 50-1000 MG TB24 Take 1 tablet by mouth daily.   valACYclovir  (VALTREX ) 1000 MG tablet Take 1 tablet (1,000 mg total) by mouth 3 (three) times daily.   No facility-administered encounter medications on file as of 06/28/2023.    Past Surgical History:  Procedure Laterality Date   CESAREAN SECTION  1987   x 1   EYE SURGERY     weak muscle eye surgery as child   HYSTEROSCOPY WITH D & C  09/20/2011   Procedure: DILATATION AND CURETTAGE /HYSTEROSCOPY;  Surgeon: Atlas Blank, DO;  Location: [facility];  Service: Gynecology;  Laterality: N/A;  colposcopy   LITHOTRIPSY     kidney stone   REFRACTIVE SURGERY     broken blood vessels behind eyes - bilaterally   right side wisdom teeth ext     still has left wisdom teeth top/botom   TOOTH EXTRACTION     upper and lower back right    Family History  Problem Relation Age of Onset   Diabetes Mother    Hypertension Mother    Atrial fibrillation Mother    Heart attack Father    Diabetes Sister       Controlled substance contract: n/a     Review of Systems  Constitutional:  Negative for diaphoresis.  Eyes:  Negative for pain.  Respiratory:  Negative for shortness of breath.   Cardiovascular:  Negative for chest pain, palpitations and leg swelling.  Gastrointestinal:  Negative for abdominal pain.  Endocrine: Negative for polydipsia.  Skin:  Negative for rash.  Neurological:  Negative for dizziness, weakness and headaches.  Hematological:  Does not bruise/bleed easily.  All other systems reviewed and are negative.      Objective:   Physical Exam Vitals and nursing note reviewed.   Constitutional:      General: She is not in acute distress.    Appearance: Normal appearance. She is well-developed.  HENT:     Head: Normocephalic.     Right Ear: Tympanic membrane normal.     Left Ear: Tympanic membrane normal.     Nose: Nose normal.     Mouth/Throat:     Mouth: Mucous membranes are moist.  Eyes:     Pupils: Pupils are equal, round, and reactive to light.  Neck:     Vascular: No carotid bruit or JVD.  Cardiovascular:     Rate and Rhythm: Normal rate and regular rhythm.  Heart sounds: Normal heart sounds.  Pulmonary:     Effort: Pulmonary effort is normal. No respiratory distress.     Breath sounds: Normal breath sounds. No wheezing or rales.  Chest:     Chest wall: No tenderness.  Abdominal:     General: Bowel sounds are normal. There is no distension or abdominal bruit.     Palpations: Abdomen is soft. There is no hepatomegaly, splenomegaly, mass or pulsatile mass.     Tenderness: There is no abdominal tenderness.  Musculoskeletal:        General: Normal range of motion.     Cervical back: Normal range of motion and neck supple.  Lymphadenopathy:     Cervical: No cervical adenopathy.  Skin:    General: Skin is warm and dry.     Comments: Soft slightly tender erythematous lesion on right buttocks  Neurological:     Mental Status: She is alert and oriented to person, place, and time.     Deep Tendon Reflexes: Reflexes are normal and symmetric.  Psychiatric:        Behavior: Behavior normal.        Thought Content: Thought content normal.        Judgment: Judgment normal.     BP (!) 172/82   Pulse 68   Temp (!) 97.4 F (36.3 C) (Temporal)   Ht 5\' 8"  (1.727 m)   Wt 291 lb (132 kg)   SpO2 95%   BMI 44.25 kg/m     Hgba1c 10.1%       Assessment & Plan:  Emma Griffin comes in today with chief complaint of medical management of chronic issues    Diagnosis and orders addressed:  1. Primary hypertension Low sodium diet - CBC with  Differential/Platelet - CMP14+EGFR - Lipid panel - Bayer DCA Hb A1c Waived - lisinopril  (ZESTRIL ) 20 MG tablet; Take 1 tablet (20 mg total) by mouth 2 (two) times daily.  Dispense: 180 tablet; Refill: 1 - metoprolol  succinate (TOPROL -XL) 100 MG 24 hr tablet; Take 1 tablet (100 mg total) by mouth daily. TAKE WITH OR IMMEDIATELY FOLLOWING A MEAL.  Dispense: 90 tablet; Refill: 1  2. Hyperlipidemia associated with type 2 diabetes mellitus (HCC) Low fat diet - CBC with Differential/Platelet - CMP14+EGFR - Lipid panel - Bayer DCA Hb A1c Waived  3. Type 2 diabetes mellitus with hyperglycemia, with long-term current use of insulin  (HCC) Stricter carb counting Increase novalog to 50 u per meal Increase tresiba  180 u - CBC with Differential/Platelet - CMP14+EGFR - Lipid panel - Bayer DCA Hb A1c Waived - Continuous Glucose Receiver (FREESTYLE LIBRE 3 READER) DEVI; 1 each by Does not apply route daily.  Dispense: 1 each; Refill: 0 - Continuous Glucose Sensor (FREESTYLE LIBRE 3 SENSOR) MISC; 1 each by Does not apply route every 14 (fourteen) days.  Dispense: 2 each; Refill: 5 - insulin  degludec (TRESIBA  FLEXTOUCH) 200 UNIT/ML FlexTouch Pen; Inject 180 Units into the skin daily.  Dispense: 100 mL; Refill: 3 - insulin  aspart (NOVOLOG ) 100 UNIT/ML injection; Inject 46 Units into the skin 3 (three) times daily with meals.  Dispense: 10 mL; Refill: 11 - SitaGLIPtin -MetFORMIN  HCl (JANUMET  XR) 50-1000 MG TB24; Take 1 tablet by mouth daily.  Dispense: 90 tablet; Refill: 1 - C-peptide  4. Gastroesophageal reflux disease without esophagitis Avoid spicy foods Do not eat 2 hours prior to bedtime   5. Peripheral edema Elevate legs when sitting - furosemide  (LASIX ) 40 MG tablet; Take 1 tablet (40 mg total) by  mouth daily.  Dispense: 90 tablet; Refill: 1  6. Morbid obesity (HCC) Discussed diet and exercise for person with BMI >25 Will recheck weight in 3-6 months   7. Mixed hyperlipidemia Low fat  diet - atorvastatin  (LIPITOR) 80 MG tablet; Take 1 tablet (80 mg total) by mouth daily.  Dispense: 90 tablet; Refill: 1   Referral to cardiology Labs pending Health Maintenance reviewed Diet and exercise encouraged  Follow up plan: 3 months   Mary-Margaret Gaylyn Keas, [name]

## 2023-06-29 LAB — CMP14+EGFR
ALT: 39 IU/L — ABNORMAL HIGH (ref 0–32)
AST: 35 IU/L (ref 0–40)
Albumin: 3.6 g/dL — ABNORMAL LOW (ref 3.8–4.8)
Alkaline Phosphatase: 118 IU/L (ref 44–121)
BUN/Creatinine Ratio: 14 (ref 12–28)
BUN: 13 mg/dL (ref 8–27)
Bilirubin Total: 0.5 mg/dL (ref 0.0–1.2)
CO2: 24 mmol/L (ref 20–29)
Calcium: 8.6 mg/dL — ABNORMAL LOW (ref 8.7–10.3)
Chloride: 103 mmol/L (ref 96–106)
Creatinine, Ser: 0.94 mg/dL (ref 0.57–1.00)
Globulin, Total: 3.6 g/dL (ref 1.5–4.5)
Glucose: 225 mg/dL — ABNORMAL HIGH (ref 70–99)
Potassium: 4.4 mmol/L (ref 3.5–5.2)
Sodium: 141 mmol/L (ref 134–144)
Total Protein: 7.2 g/dL (ref 6.0–8.5)
eGFR: 64 mL/min/{1.73_m2} (ref >59)

## 2023-06-29 LAB — LIPID PANEL
Cholesterol, Total: 136 mg/dL (ref 100–199)
HDL: 30 mg/dL — ABNORMAL LOW (ref >39)
LDL CALC COMMENT:: 4.5 ratio — ABNORMAL HIGH (ref 0.0–4.4)
LDL Chol Calc (NIH): 68 mg/dL (ref 0–99)
Triglycerides: 229 mg/dL — ABNORMAL HIGH (ref 0–149)
VLDL Cholesterol Cal: 38 mg/dL (ref 5–40)

## 2023-06-29 LAB — CBC WITH DIFFERENTIAL/PLATELET
Basophils Absolute: 0.1 10*3/uL (ref 0.0–0.2)
Basos: 1 %
EOS (ABSOLUTE): 0.2 10*3/uL (ref 0.0–0.4)
Eos: 3 %
Hematocrit: 39.5 % (ref 34.0–46.6)
Hemoglobin: 12.5 g/dL (ref 11.1–15.9)
Immature Grans (Abs): 0.1 10*3/uL (ref 0.0–0.1)
Immature Granulocytes: 1 %
Lymphocytes Absolute: 2.5 10*3/uL (ref 0.7–3.1)
Lymphs: 30 %
MCH: 26 pg — ABNORMAL LOW (ref 26.6–33.0)
MCHC: 31.6 g/dL (ref 31.5–35.7)
MCV: 82 fL (ref 79–97)
Monocytes Absolute: 0.7 10*3/uL (ref 0.1–0.9)
Monocytes: 9 %
Neutrophils Absolute: 4.7 10*3/uL (ref 1.4–7.0)
Neutrophils: 56 %
Platelets: 194 10*3/uL (ref 150–450)
RBC: 4.81 x10E6/uL (ref 3.77–5.28)
RDW: 14.4 % (ref 11.7–15.4)
WBC: 8.3 10*3/uL (ref 3.4–10.8)

## 2023-06-30 ENCOUNTER — Other Ambulatory Visit: Payer: Self-pay | Admitting: Nurse Practitioner

## 2023-06-30 DIAGNOSIS — E782 Mixed hyperlipidemia: Secondary | ICD-10-CM

## 2023-07-04 ENCOUNTER — Other Ambulatory Visit: Payer: Self-pay | Admitting: Nurse Practitioner

## 2023-07-04 DIAGNOSIS — Z794 Long term (current) use of insulin: Secondary | ICD-10-CM

## 2023-07-04 DIAGNOSIS — R600 Localized edema: Secondary | ICD-10-CM

## 2023-07-05 DIAGNOSIS — I152 Hypertension secondary to endocrine disorders: Secondary | ICD-10-CM | POA: Diagnosis not present

## 2023-07-05 DIAGNOSIS — R0609 Other forms of dyspnea: Secondary | ICD-10-CM | POA: Diagnosis not present

## 2023-07-05 DIAGNOSIS — E1169 Type 2 diabetes mellitus with other specified complication: Secondary | ICD-10-CM | POA: Diagnosis not present

## 2023-07-05 DIAGNOSIS — I25118 Atherosclerotic heart disease of native coronary artery with other forms of angina pectoris: Secondary | ICD-10-CM | POA: Diagnosis not present

## 2023-07-05 DIAGNOSIS — E785 Hyperlipidemia, unspecified: Secondary | ICD-10-CM | POA: Diagnosis not present

## 2023-07-05 DIAGNOSIS — E1159 Type 2 diabetes mellitus with other circulatory complications: Secondary | ICD-10-CM | POA: Diagnosis not present

## 2023-07-06 ENCOUNTER — Other Ambulatory Visit: Payer: Self-pay | Admitting: Nurse Practitioner

## 2023-07-06 DIAGNOSIS — I10 Essential (primary) hypertension: Secondary | ICD-10-CM

## 2023-07-11 ENCOUNTER — Other Ambulatory Visit: Payer: Self-pay | Admitting: Nurse Practitioner

## 2023-07-11 DIAGNOSIS — I10 Essential (primary) hypertension: Secondary | ICD-10-CM

## 2023-07-11 NOTE — Telephone Encounter (Signed)
 Copied from CRM [id number]. Topic: Clinical - Prescription Issue >> Jul 11, 2023  9:33 AM Hamp Levine R wrote: Reason for CRM: Patient states her prescription for insulin  aspart (NOVOLOG  FLEXPEN) 100 UNIT/ML FlexPen was only for 10 days when she picked it up at them pharmacy. Says when she seen the doctor last week she was advised it would be for 90 days since it is more cost effective.  [id number] [place], [place] - [address] [place] [address] Phone: [phone]  Fax: [phone]  Is requesting a call back from the doctor or from [place] if possible. Patient can be reached at [phone]

## 2023-07-12 MED ORDER — NOVOLOG FLEXPEN 100 UNIT/ML ~~LOC~~ SOPN: 46.0000 [IU] | PEN_INJECTOR | Freq: Three times a day (TID) | SUBCUTANEOUS | 0 refills | Status: DC

## 2023-07-12 NOTE — Addendum Note (Signed)
 Addended by: Leslye Puccini D on: 07/12/2023 03:53 PM   Modules accepted: Orders

## 2023-07-12 NOTE — Telephone Encounter (Signed)
 15 ml was only a 10 day supply, she usually gets a 90-d supply, which is 135 ml, [facility]

## 2023-08-01 ENCOUNTER — Encounter (INDEPENDENT_AMBULATORY_CARE_PROVIDER_SITE_OTHER): Admitting: Ophthalmology

## 2023-08-01 DIAGNOSIS — I10 Essential (primary) hypertension: Secondary | ICD-10-CM

## 2023-08-01 DIAGNOSIS — E113513 Type 2 diabetes mellitus with proliferative diabetic retinopathy with macular edema, bilateral: Secondary | ICD-10-CM

## 2023-08-01 DIAGNOSIS — Z794 Long term (current) use of insulin: Secondary | ICD-10-CM | POA: Diagnosis not present

## 2023-08-01 DIAGNOSIS — H43813 Vitreous degeneration, bilateral: Secondary | ICD-10-CM | POA: Diagnosis not present

## 2023-08-01 DIAGNOSIS — H35033 Hypertensive retinopathy, bilateral: Secondary | ICD-10-CM

## 2023-08-01 DIAGNOSIS — H33102 Unspecified retinoschisis, left eye: Secondary | ICD-10-CM | POA: Diagnosis not present

## 2023-08-01 DIAGNOSIS — D3132 Benign neoplasm of left choroid: Secondary | ICD-10-CM

## 2023-08-01 DIAGNOSIS — Z7985 Long-term (current) use of injectable non-insulin antidiabetic drugs: Secondary | ICD-10-CM

## 2023-08-06 ENCOUNTER — Telehealth: Payer: Self-pay | Admitting: Pharmacist

## 2023-08-06 NOTE — Telephone Encounter (Signed)
 Patient was identified as falling into the [place] Measure - Diabetes.   Patient was: Requires a call back at a later time.   Called patient to review medications and recent blood glucose readings, but she was unable to talk today. She said she thinks she would be available next Monday so will follow up next week.  Georga Killings, PharmD PGY-1 Pharmacy Resident

## 2023-08-10 DIAGNOSIS — R0609 Other forms of dyspnea: Secondary | ICD-10-CM | POA: Diagnosis not present

## 2023-09-05 ENCOUNTER — Encounter (INDEPENDENT_AMBULATORY_CARE_PROVIDER_SITE_OTHER): Admitting: Ophthalmology

## 2023-09-05 DIAGNOSIS — I10 Essential (primary) hypertension: Secondary | ICD-10-CM | POA: Diagnosis not present

## 2023-09-05 DIAGNOSIS — H43813 Vitreous degeneration, bilateral: Secondary | ICD-10-CM

## 2023-09-05 DIAGNOSIS — H35033 Hypertensive retinopathy, bilateral: Secondary | ICD-10-CM

## 2023-09-05 DIAGNOSIS — Z794 Long term (current) use of insulin: Secondary | ICD-10-CM

## 2023-09-05 DIAGNOSIS — H33102 Unspecified retinoschisis, left eye: Secondary | ICD-10-CM | POA: Diagnosis not present

## 2023-09-05 DIAGNOSIS — Z7985 Long-term (current) use of injectable non-insulin antidiabetic drugs: Secondary | ICD-10-CM

## 2023-09-05 DIAGNOSIS — E113513 Type 2 diabetes mellitus with proliferative diabetic retinopathy with macular edema, bilateral: Secondary | ICD-10-CM

## 2023-09-19 DIAGNOSIS — I152 Hypertension secondary to endocrine disorders: Secondary | ICD-10-CM | POA: Diagnosis not present

## 2023-09-19 DIAGNOSIS — E1169 Type 2 diabetes mellitus with other specified complication: Secondary | ICD-10-CM | POA: Diagnosis not present

## 2023-09-19 DIAGNOSIS — I25118 Atherosclerotic heart disease of native coronary artery with other forms of angina pectoris: Secondary | ICD-10-CM | POA: Diagnosis not present

## 2023-09-19 DIAGNOSIS — E1159 Type 2 diabetes mellitus with other circulatory complications: Secondary | ICD-10-CM | POA: Diagnosis not present

## 2023-09-19 DIAGNOSIS — E785 Hyperlipidemia, unspecified: Secondary | ICD-10-CM | POA: Diagnosis not present

## 2023-09-20 ENCOUNTER — Encounter: Payer: Self-pay | Admitting: Nurse Practitioner

## 2023-09-20 ENCOUNTER — Ambulatory Visit: Admitting: Nurse Practitioner

## 2023-09-20 VITALS — BP 138/68 | HR 70 | Temp 97.6°F | Ht 68.0 in | Wt 292.0 lb

## 2023-09-20 DIAGNOSIS — K219 Gastro-esophageal reflux disease without esophagitis: Secondary | ICD-10-CM

## 2023-09-20 DIAGNOSIS — Z794 Long term (current) use of insulin: Secondary | ICD-10-CM

## 2023-09-20 DIAGNOSIS — E1169 Type 2 diabetes mellitus with other specified complication: Secondary | ICD-10-CM

## 2023-09-20 DIAGNOSIS — R600 Localized edema: Secondary | ICD-10-CM | POA: Diagnosis not present

## 2023-09-20 DIAGNOSIS — E785 Hyperlipidemia, unspecified: Secondary | ICD-10-CM

## 2023-09-20 DIAGNOSIS — E782 Mixed hyperlipidemia: Secondary | ICD-10-CM | POA: Diagnosis not present

## 2023-09-20 DIAGNOSIS — L03032 Cellulitis of left toe: Secondary | ICD-10-CM | POA: Diagnosis not present

## 2023-09-20 DIAGNOSIS — I10 Essential (primary) hypertension: Secondary | ICD-10-CM

## 2023-09-20 DIAGNOSIS — E1165 Type 2 diabetes mellitus with hyperglycemia: Secondary | ICD-10-CM

## 2023-09-20 LAB — BAYER DCA HB A1C WAIVED: HB A1C (BAYER DCA - WAIVED): 10.8 % — ABNORMAL HIGH (ref 4.8–5.6)

## 2023-09-20 MED ORDER — SULFAMETHOXAZOLE-TRIMETHOPRIM 800-160 MG PO TABS: 1.0000 | ORAL_TABLET | Freq: Two times a day (BID) | ORAL | 0 refills | Status: DC

## 2023-09-20 MED ORDER — TRESIBA FLEXTOUCH 200 UNIT/ML ~~LOC~~ SOPN: 180.0000 [IU] | PEN_INJECTOR | Freq: Every day | SUBCUTANEOUS | 1 refills | Status: DC

## 2023-09-20 MED ORDER — METOPROLOL SUCCINATE ER 100 MG PO TB24: 100.0000 mg | ORAL_TABLET | Freq: Every day | ORAL | 1 refills | Status: DC

## 2023-09-20 MED ORDER — LISINOPRIL 20 MG PO TABS: 20.0000 mg | ORAL_TABLET | Freq: Two times a day (BID) | ORAL | 1 refills | Status: DC

## 2023-09-20 MED ORDER — FUROSEMIDE 40 MG PO TABS: 40.0000 mg | ORAL_TABLET | Freq: Every day | ORAL | 1 refills | Status: DC

## 2023-09-20 MED ORDER — ATORVASTATIN CALCIUM 80 MG PO TABS
80.0000 mg | ORAL_TABLET | Freq: Every day | ORAL | 1 refills | Status: AC
Start: 2023-09-20 — End: ?

## 2023-09-20 NOTE — Patient Instructions (Signed)

## 2023-09-20 NOTE — Progress Notes (Addendum)
 Subjective:    Patient ID: Emma Griffin, female    DOB: Dec 05, 1950, 73 y.o.   MRN: [id number]   Chief Complaint: medical management of chronic issues     HPI:  Raiya Stainback is a 73 y.o. who identifies as a female who was assigned female at birth.   Social history: Lives with: son and daughter  in law Work history: disability   Comes in today for follow up of the following chronic medical issues:  1. Primary hypertension No c/o chest pain, sob or headache. Does not check blood pressure at home. BP Readings from Last 3 Encounters:  06/28/23 (!) 172/82  03/23/23 (!) 163/79  12/28/22 (!) 152/79     2. Hyperlipidemia associated with type 2 diabetes mellitus (HCC) Does not watch diet and does no exercise. Lab Results  Component Value Date   CHOL 136 06/28/2023   HDL 30 (L) 06/28/2023   LDLCALC 68 06/28/2023   TRIG 229 (H) 06/28/2023   CHOLHDL 4.5 (H) 06/28/2023      3. Type 2 diabetes mellitus with hyperglycemia, with long-term current use of insulin  [facility]) Patient finely agreed on CGM and she loves it. Her blood sugars are running around 180-250. At last visit we increased her tresiba  to 180u nightly. Encouraged to do nightly novolog  dosing.  She was off janumet  for awhile due to abdominal pain. When she stopped it the pain resolved. She started back on janumet  a few weeks ago and is dosing well. Lab Results  Component Value Date   HGBA1C 10.1 (H) 06/28/2023     4. Gastroesophageal reflux disease without esophagitis Just uses OTC meds as needed  5. Peripheral edema Has daily edema  6. Morbid obesity (HCC) Weight is stable  Wt Readings from Last 3 Encounters:  09/20/23 292 lb (132.5 kg)  06/28/23 291 lb (132 kg)  03/23/23 286 lb (129.7 kg)   BMI Readings from Last 3 Encounters:  09/20/23 44.40 kg/m  06/28/23 44.25 kg/m  03/23/23 43.49 kg/m       New complaints: Saw cardiology yesterday- said heart seemed fine for now.  Allergies  Allergen  Reactions   Amlodipine Other (See Comments)    Other reaction(s): Other edema edema   Ozempic  (0.25 Or 0.5 Mg-Dose) [Semaglutide (0.25 Or 0.5mg -Dos)] Nausea Only    Severe nausea   Outpatient Encounter Medications as of 09/20/2023  Medication Sig   Accu-Chek Softclix Lancets lancets TEST 4 TIMES DAILY Dx E11.6   Ascorbic Acid (VITAMIN C) 1000 MG tablet Take 1,000 mg by mouth daily.   ASPIRIN  LOW DOSE 81 MG chewable tablet CHEW 1 TABLET BY MOUTH EVERY DAY   atorvastatin  (LIPITOR) 80 MG tablet TAKE 1 TABLET BY MOUTH EVERY DAY   bevacizumab  (AVASTIN ) 1.25 mg/0.1 mL SOLN Apply 1.25 mg to eye See admin instructions. Opthalmic injection every 4-6 weeks   Blood Glucose Monitoring Suppl DEVI 1 each by Does not apply route in the morning, at noon, and at bedtime. May substitute to any manufacturer covered by patient's insurance.   Calcium  Carbonate-Vitamin D ([employer] CALCIUM /VIT D SOFT CHEWS PO) Take 1 tablet by mouth daily.    Continuous Glucose Receiver (FREESTYLE LIBRE 3 READER) DEVI 1 each by Does not apply route daily. Use to test blood sugar continuously. DX: E11.65   Continuous Glucose Sensor (FREESTYLE LIBRE 3 SENSOR) MISC Apply to arm every 14 days to test blood sugar continuously. DX: E11.65   furosemide  (LASIX ) 40 MG tablet TAKE 1 TABLET BY MOUTH EVERY DAY  glucose blood (ACCU-CHEK GUIDE) test strip Use as instructed   ibuprofen (ADVIL,MOTRIN) 200 MG tablet Take 400-600 mg by mouth every 6 (six) hours as needed. For pain/cramps   insulin  aspart (NOVOLOG  FLEXPEN) 100 UNIT/ML FlexPen Inject 46 Units into the skin 3 (three) times daily with meals.   insulin  degludec (TRESIBA  FLEXTOUCH) 200 UNIT/ML FlexTouch Pen INJECT 176 UNITS INTO THE SKIN DAILY.   Insulin  Pen Needle 32G X 6 MM MISC 1 each by Does not apply route in the morning, at noon, in the evening, and at bedtime.   JANUMET  XR 50-1000 MG TB24 TAKE 1 TABLET BY MOUTH EVERY DAY   lisinopril  (ZESTRIL ) 20 MG tablet TAKE 1 TABLET BY MOUTH TWICE  A DAY   metoprolol  succinate (TOPROL -XL) 100 MG 24 hr tablet TAKE 1 TABLET BY MOUTH DAILY. TAKE WITH OR IMMEDIATELY FOLLOWING A MEAL.   Multiple Vitamins-Minerals (AIRBORNE) CHEW Chew 1 tablet by mouth as needed.    nitroGLYCERIN  (NITROSTAT ) 0.4 MG SL tablet Place 1 tablet (0.4 mg total) under the tongue every 5 (five) minutes as needed for chest pain.   valACYclovir  (VALTREX ) 1000 MG tablet Take 1 tablet (1,000 mg total) by mouth 3 (three) times daily.   No facility-administered encounter medications on file as of 09/20/2023.    Past Surgical History:  Procedure Laterality Date   CESAREAN SECTION  1987   x 1   EYE SURGERY     weak muscle eye surgery as child   HYSTEROSCOPY WITH D & C  09/20/2011   Procedure: DILATATION AND CURETTAGE /HYSTEROSCOPY;  Surgeon: Sereniti Just, DO;  Location: [facility];  Service: Gynecology;  Laterality: N/A;  colposcopy   LITHOTRIPSY     kidney stone   REFRACTIVE SURGERY     broken blood vessels behind eyes - bilaterally   right side wisdom teeth ext     still has left wisdom teeth top/botom   TOOTH EXTRACTION     upper and lower back right    Family History  Problem Relation Age of Onset   Diabetes Mother    Hypertension Mother    Atrial fibrillation Mother    Heart attack Father    Diabetes Sister       Controlled substance contract: n/a     Review of Systems  Constitutional:  Negative for diaphoresis.  Eyes:  Negative for pain.  Respiratory:  Negative for shortness of breath.   Cardiovascular:  Negative for chest pain, palpitations and leg swelling.  Gastrointestinal:  Negative for abdominal pain.  Endocrine: Negative for polydipsia.  Skin:  Negative for rash.  Neurological:  Negative for dizziness, weakness and headaches.  Hematological:  Does not bruise/bleed easily.  All other systems reviewed and are negative.      Objective:   Physical Exam Vitals and nursing note reviewed.  Constitutional:      General: She is not in  acute distress.    Appearance: Normal appearance. She is well-developed.  HENT:     Head: Normocephalic.     Right Ear: Tympanic membrane normal.     Left Ear: Tympanic membrane normal.     Nose: Nose normal.     Mouth/Throat:     Mouth: Mucous membranes are moist.  Eyes:     Pupils: Pupils are equal, round, and reactive to light.  Neck:     Vascular: No carotid bruit or JVD.  Cardiovascular:     Rate and Rhythm: Normal rate and regular rhythm.     Heart sounds:  Normal heart sounds.  Pulmonary:     Effort: Pulmonary effort is normal. No respiratory distress.     Breath sounds: Normal breath sounds. No wheezing or rales.  Chest:     Chest wall: No tenderness.  Abdominal:     General: Bowel sounds are normal. There is no distension or abdominal bruit.     Palpations: Abdomen is soft. There is no hepatomegaly, splenomegaly, mass or pulsatile mass.     Tenderness: There is no abdominal tenderness.  Musculoskeletal:        General: Normal range of motion.     Cervical back: Normal range of motion and neck supple.  Lymphadenopathy:     Cervical: No cervical adenopathy.  Skin:    General: Skin is warm and dry.     Comments: Left first nail bed is thick and abnormally shaped- dry blood around nail    Neurological:     Mental Status: She is alert and oriented to person, place, and time.     Deep Tendon Reflexes: Reflexes are normal and symmetric.  Psychiatric:        Behavior: Behavior normal.        Thought Content: Thought content normal.        Judgment: Judgment normal.     BP 138/68   Pulse 70   Temp 97.6 F (36.4 C) (Temporal)   Ht 5' 8 (1.727 m)   Wt 292 lb (132.5 kg)   SpO2 94%   BMI 44.40 kg/m     Hgba1c 10.8%       Assessment & Plan:  Emma Griffin comes in today with chief complaint of medical management of chronic issues    Diagnosis and orders addressed:  1. Primary hypertension Low sodium diet - CBC with Differential/Platelet -  CMP14+EGFR - Lipid panel - Bayer DCA Hb A1c Waived - lisinopril  (ZESTRIL ) 20 MG tablet; Take 1 tablet (20 mg total) by mouth 2 (two) times daily.  Dispense: 180 tablet; Refill: 1 - metoprolol  succinate (TOPROL -XL) 100 MG 24 hr tablet; Take 1 tablet (100 mg total) by mouth daily. TAKE WITH OR IMMEDIATELY FOLLOWING A MEAL.  Dispense: 90 tablet; Refill: 1  2. Hyperlipidemia associated with type 2 diabetes mellitus (HCC) Low fat diet - CBC with Differential/Platelet - CMP14+EGFR - Lipid panel - Bayer DCA Hb A1c Waived  3. Type 2 diabetes mellitus with hyperglycemia, with long-term current use of insulin  (HCC) Stricter carb counting Increase novalog to 50 u per meal Continue  tresiba  180 u Continue janumet  - CBC with Differential/Platelet - CMP14+EGFR - Lipid panel - Bayer DCA Hb A1c Waived - Continuous Glucose Receiver (FREESTYLE LIBRE 3 READER) DEVI; 1 each by Does not apply route daily.  Dispense: 1 each; Refill: 0 - Continuous Glucose Sensor (FREESTYLE LIBRE 3 SENSOR) MISC; 1 each by Does not apply route every 14 (fourteen) days.  Dispense: 2 each; Refill: 5 - insulin  degludec (TRESIBA  FLEXTOUCH) 200 UNIT/ML FlexTouch Pen; Inject 160 Units into the skin daily.  Dispense: 100 mL; Refill: 3 - insulin  aspart (NOVOLOG ) 100 UNIT/ML injection; Inject 46 Units into the skin 3 (three) times daily with meals.  Dispense: 10 mL; Refill: 11 - SitaGLIPtin -MetFORMIN  HCl (JANUMET  XR) 50-1000 MG TB24; Take 1 tablet by mouth daily.  Dispense: 90 tablet; Refill: 1 - C-peptide  4. Gastroesophageal reflux disease without esophagitis Avoid spicy foods Do not eat 2 hours prior to bedtime   5. Peripheral edema Elevate legs when sitting - furosemide  (LASIX ) 40 MG tablet; Take  1 tablet (40 mg total) by mouth daily.  Dispense: 90 tablet; Refill: 1  6. Morbid obesity (HCC) Discussed diet and exercise for person with BMI >25 Will recheck weight in 3-6 months   7. Mixed hyperlipidemia Low fat diet -  atorvastatin  (LIPITOR) 80 MG tablet; Take 1 tablet (80 mg total) by mouth daily.  Dispense: 90 tablet; Refill: 1  8. Paronychia left great toe nail Epsom salt soaks Antibiotic if does not improve   Labs pending Health Maintenance reviewed Diet and exercise encouraged  Follow up plan: 3 months   Mary-Margaret Gladis, [name]

## 2023-09-20 NOTE — Addendum Note (Signed)
 Addended by: Elsie Sakuma, MARY-MARGARET on: 09/20/2023 12:12 PM   Modules accepted: Orders

## 2023-09-21 ENCOUNTER — Ambulatory Visit: Payer: Self-pay | Admitting: Nurse Practitioner

## 2023-09-21 LAB — CMP14+EGFR
ALT: 24 IU/L (ref 0–32)
AST: 26 IU/L (ref 0–40)
Albumin: 3.9 g/dL (ref 3.8–4.8)
Alkaline Phosphatase: 131 IU/L — ABNORMAL HIGH (ref 44–121)
BUN/Creatinine Ratio: 25 (ref 12–28)
BUN: 25 mg/dL (ref 8–27)
Bilirubin Total: 0.4 mg/dL (ref 0.0–1.2)
CO2: 24 mmol/L (ref 20–29)
Calcium: 9.5 mg/dL (ref 8.7–10.3)
Chloride: 101 mmol/L (ref 96–106)
Creatinine, Ser: 0.99 mg/dL (ref 0.57–1.00)
Globulin, Total: 3.4 g/dL (ref 1.5–4.5)
Glucose: 214 mg/dL — ABNORMAL HIGH (ref 70–99)
Potassium: 4.3 mmol/L (ref 3.5–5.2)
Sodium: 141 mmol/L (ref 134–144)
Total Protein: 7.3 g/dL (ref 6.0–8.5)
eGFR: 60 mL/min/1.73 (ref >59)

## 2023-09-21 LAB — CBC WITH DIFFERENTIAL/PLATELET
Basophils Absolute: 0.1 x10E3/uL (ref 0.0–0.2)
Basos: 1 %
EOS (ABSOLUTE): 0.3 x10E3/uL (ref 0.0–0.4)
Eos: 3 %
Hematocrit: 41.6 % (ref 34.0–46.6)
Hemoglobin: 13.5 g/dL (ref 11.1–15.9)
Immature Grans (Abs): 0 x10E3/uL (ref 0.0–0.1)
Immature Granulocytes: 0 %
Lymphocytes Absolute: 2.7 x10E3/uL (ref 0.7–3.1)
Lymphs: 27 %
MCH: 26.1 pg — ABNORMAL LOW (ref 26.6–33.0)
MCHC: 32.5 g/dL (ref 31.5–35.7)
MCV: 80 fL (ref 79–97)
Monocytes Absolute: 0.8 x10E3/uL (ref 0.1–0.9)
Monocytes: 8 %
Neutrophils Absolute: 6.2 x10E3/uL (ref 1.4–7.0)
Neutrophils: 61 %
Platelets: 189 x10E3/uL (ref 150–450)
RBC: 5.18 x10E6/uL (ref 3.77–5.28)
RDW: 14.7 % (ref 11.7–15.4)
WBC: 10.2 x10E3/uL (ref 3.4–10.8)

## 2023-09-21 LAB — LIPID PANEL
Chol/HDL Ratio: 4.5 ratio — ABNORMAL HIGH (ref 0.0–4.4)
Cholesterol, Total: 122 mg/dL (ref 100–199)
HDL: 27 mg/dL — ABNORMAL LOW (ref >39)
LDL Chol Calc (NIH): 53 mg/dL (ref 0–99)
Triglycerides: 264 mg/dL — ABNORMAL HIGH (ref 0–149)
VLDL Cholesterol Cal: 42 mg/dL — ABNORMAL HIGH (ref 5–40)

## 2023-10-04 ENCOUNTER — Other Ambulatory Visit: Payer: Self-pay | Admitting: Nurse Practitioner

## 2023-10-05 ENCOUNTER — Other Ambulatory Visit: Payer: Self-pay | Admitting: Nurse Practitioner

## 2023-10-10 ENCOUNTER — Encounter (INDEPENDENT_AMBULATORY_CARE_PROVIDER_SITE_OTHER): Admitting: Ophthalmology

## 2023-10-10 DIAGNOSIS — Z794 Long term (current) use of insulin: Secondary | ICD-10-CM | POA: Diagnosis not present

## 2023-10-10 DIAGNOSIS — E113513 Type 2 diabetes mellitus with proliferative diabetic retinopathy with macular edema, bilateral: Secondary | ICD-10-CM

## 2023-10-10 DIAGNOSIS — H33102 Unspecified retinoschisis, left eye: Secondary | ICD-10-CM

## 2023-10-10 DIAGNOSIS — H35033 Hypertensive retinopathy, bilateral: Secondary | ICD-10-CM | POA: Diagnosis not present

## 2023-10-10 DIAGNOSIS — H43813 Vitreous degeneration, bilateral: Secondary | ICD-10-CM | POA: Diagnosis not present

## 2023-10-10 DIAGNOSIS — I10 Essential (primary) hypertension: Secondary | ICD-10-CM

## 2023-10-10 DIAGNOSIS — D3132 Benign neoplasm of left choroid: Secondary | ICD-10-CM

## 2023-10-10 DIAGNOSIS — Z7985 Long-term (current) use of injectable non-insulin antidiabetic drugs: Secondary | ICD-10-CM | POA: Diagnosis not present

## 2023-10-12 ENCOUNTER — Other Ambulatory Visit: Payer: Self-pay | Admitting: Nurse Practitioner

## 2023-10-12 DIAGNOSIS — E1165 Type 2 diabetes mellitus with hyperglycemia: Secondary | ICD-10-CM

## 2023-10-29 ENCOUNTER — Ambulatory Visit: Payer: Self-pay

## 2023-10-29 ENCOUNTER — Other Ambulatory Visit: Payer: Self-pay

## 2023-10-29 DIAGNOSIS — R109 Unspecified abdominal pain: Secondary | ICD-10-CM

## 2023-10-29 NOTE — Telephone Encounter (Signed)
 FYI Only or Action Required?: Action required by provider: Refusing ED, needs call back, requesting meds, attempted to alert [facility] to ED refusal, no answer.  Patient was last seen in primary care on 09/20/2023 by Gladis Mustard, [name].  Called Nurse Triage reporting Fatigue, hx kidney stone, Flank Pain, Groin Pain, severe chills, Nausea, and Urinary Frequency.  Symptoms began yesterday.  Interventions attempted: Rest, hydration, or home remedies.  Symptoms are: rapidly worsening.  Triage Disposition: Go to ED Now (Notify PCP)  Patient/caregiver understands and will follow disposition?: No, refuses disposition     Copied from CRM [id number]. Topic: Clinical - Red Word Triage >> Oct 29, 2023 11:24 AM Jasmin G wrote: [facility] that prompted transfer to Nurse Triage: Pt states that she has kidney stone and has been dealing with pain on and off all night long, accompanied by tiredness and chills this morning.    Reason for Disposition  [1] SEVERE pain (e.g., excruciating, scale 8-10) AND [2] present > 1 hour  Answer Assessment - Initial Assessment Questions 1. LOCATION: Where does it hurt? (e.g., left, right)     Flank pain more so right side than left but both sides, groin more on the right side than left side 2. ONSET: When did the pain start?     Last night 3. SEVERITY: How bad is the pain? (e.g., Scale 1-10; mild, moderate, or severe)     8/10 to back and groin 4. PATTERN: Does the pain come and go, or is it constant?      Constant, pretty constant 5. CAUSE: What do you think is causing the pain?     Kidney stone 6. OTHER SYMPTOMS:  Do you have any other symptoms? (e.g., fever, abdomen pain, vomiting, leg weakness, burning with urination, blood in urine)     Don't think fever Urinary frequency Several BMs, no  Drinking lots of water Chills, shaking-like, shivering even with 2-3 blankets on me Nausea No abdominal pain or leg weakness, no burning with urination  or blood in urine No SOB, chest pain, or excessive sweating Drinking lots of water, cranberry juice, gatorade   Haven't been bothered with kidney stones in long time Know what one feels like Don't feel like getting dressed, not going to hospital I have suffered Last time flonase to take from NP    Advised ED, pt refusing, requesting meds from PCP. Sending message to PCP office with pt request. Advised go to ED for any new or worsening symptoms. Attempted to alert [facility] to ED refusal, no answer.  Protocols used: Flank Pain-A-AH

## 2023-10-29 NOTE — Telephone Encounter (Signed)
 Called and spoke with patient and she states that she is not sure if she has a UTI or a kidney stone. She is having belly pain and also groin pain. Her son got her some AZO and she has been taking. This does give some relief. Advised patient to bring a urine by the office tomorrow and I would schedule a Mychart video visit for her. Patient agreed and verbalized understanding

## 2023-10-30 ENCOUNTER — Encounter: Payer: Self-pay | Admitting: Nurse Practitioner

## 2023-10-30 ENCOUNTER — Telehealth: Admitting: Nurse Practitioner

## 2023-10-30 ENCOUNTER — Ambulatory Visit: Payer: Self-pay | Admitting: Nurse Practitioner

## 2023-10-30 DIAGNOSIS — R109 Unspecified abdominal pain: Secondary | ICD-10-CM

## 2023-10-30 DIAGNOSIS — N3001 Acute cystitis with hematuria: Secondary | ICD-10-CM | POA: Diagnosis not present

## 2023-10-30 DIAGNOSIS — R300 Dysuria: Secondary | ICD-10-CM | POA: Diagnosis not present

## 2023-10-30 LAB — URINALYSIS, ROUTINE W REFLEX MICROSCOPIC
Bilirubin, UA: NEGATIVE
Leukocytes,UA: NEGATIVE
Specific Gravity, UA: 1.015 (ref 1.005–1.030)
Urobilinogen, Ur: 2 mg/dL — ABNORMAL HIGH (ref 0.2–1.0)
pH, UA: 5 (ref 5.0–7.5)

## 2023-10-30 LAB — MICROSCOPIC EXAMINATION

## 2023-10-30 MED ORDER — SULFAMETHOXAZOLE-TRIMETHOPRIM 800-160 MG PO TABS: 1.0000 | ORAL_TABLET | Freq: Two times a day (BID) | ORAL | 0 refills | Status: DC

## 2023-10-30 MED ORDER — FLUCONAZOLE 150 MG PO TABS: ORAL_TABLET | ORAL | 0 refills | Status: DC

## 2023-10-30 NOTE — Progress Notes (Signed)
 Virtual Visit Consent   Emma Griffin, you are scheduled for a virtual visit with Mary-Margaret Gladis, [name], a [facility] provider, today.     Just as with appointments in the office, your consent must be obtained to participate.  Your consent will be active for this visit and any virtual visit you may have with one of our providers in the next 365 days.     If you have a MyChart account, a copy of this consent can be sent to you electronically.  All virtual visits are billed to your insurance company just like a traditional visit in the office.    As this is a virtual visit, video technology does not allow for your provider to perform a traditional examination.  This may limit your provider's ability to fully assess your condition.  If your provider identifies any concerns that need to be evaluated in person or the need to arrange testing (such as labs, EKG, etc.), we will make arrangements to do so.     Although advances in technology are sophisticated, we cannot ensure that it will always work on either your end or our end.  If the connection with a video visit is poor, the visit may have to be switched to a telephone visit.  With either a video or telephone visit, we are not always able to ensure that we have a secure connection.     I need to obtain your verbal consent now.   Are you willing to proceed with your visit today? YES   Emma Griffin has provided verbal consent on 10/30/2023 for a virtual visit (video or telephone).   Mary-Margaret Gladis, [name]   Date: 10/30/2023 11:12 AM   Virtual Visit via Video Note   I, Mary-Margaret Gladis, connected with Emma Griffin ([id number], 12/22/1950) on 10/30/23 at  3:20 PM EDT by a video-enabled telemedicine application and verified that I am speaking with the correct person using two identifiers.  Location: Patient: Virtual Visit Location Patient: Home Provider: Virtual Visit Location Provider: Mobile   I discussed the limitations of  evaluation and management by telemedicine and the availability of in person appointments. The patient expressed understanding and agreed to proceed.    History of Present Illness: Emma Griffin is a 73 y.o. who identifies as a female who was assigned female at birth, and is being seen today for dysuria.  HPI: Urinary Tract Infection  This is a new problem. The current episode started in the past 7 days. The problem has been waxing and waning. The pain is at a severity of 5/10. There has been no fever. She is Not sexually active. There is No history of pyelonephritis. Associated symptoms include hesitancy and urgency. Treatments tried: AZO.    Review of Systems  Genitourinary:  Positive for hesitancy and urgency.    Problems:  Patient Active Problem List   Diagnosis Date Noted   Morbid obesity (HCC) 10/08/2017   Type 2 diabetes mellitus with hyperglycemia, with long-term current use of insulin  (HCC) 08/08/2016   Gastroesophageal reflux disease without esophagitis 06/28/2015   Peripheral edema 06/28/2015   Hyperlipidemia associated with type 2 diabetes mellitus (HCC) 09/30/2012   HTN (hypertension) 07/06/2010    Allergies:  Allergies  Allergen Reactions   Amlodipine Other (See Comments)    Other reaction(s): Other edema edema   Ozempic  (0.25 Or 0.5 Mg-Dose) [Semaglutide (0.25 Or 0.5mg -Dos)] Nausea Only    Severe nausea   Medications:  Current Outpatient Medications:    Accu-Chek Softclix Lancets  lancets, TEST 4 TIMES DAILY Dx E11.6, Disp: 100 each, Rfl: 0   Ascorbic Acid (VITAMIN C) 1000 MG tablet, Take 1,000 mg by mouth daily., Disp: , Rfl:    ASPIRIN  LOW DOSE 81 MG chewable tablet, CHEW 1 TABLET BY MOUTH EVERY DAY, Disp: 90 tablet, Rfl: 1   atorvastatin  (LIPITOR) 80 MG tablet, Take 1 tablet (80 mg total) by mouth daily., Disp: 90 tablet, Rfl: 1   bevacizumab  (AVASTIN ) 1.25 mg/0.1 mL SOLN, Apply 1.25 mg to eye See admin instructions. Opthalmic injection every 4-6 weeks, Disp: ,  Rfl:    Blood Glucose Monitoring Suppl DEVI, 1 each by Does not apply route in the morning, at noon, and at bedtime. May substitute to any manufacturer covered by patient's insurance., Disp: 1 each, Rfl: 0   Calcium  Carbonate-Vitamin D ([employer] CALCIUM /VIT D SOFT CHEWS PO), Take 1 tablet by mouth daily. , Disp: , Rfl:    Continuous Glucose Receiver (FREESTYLE LIBRE 3 READER) DEVI, 1 each by Does not apply route daily. Use to test blood sugar continuously. DX: E11.65, Disp: 1 each, Rfl: 0   Continuous Glucose Sensor (FREESTYLE LIBRE 3 SENSOR) MISC, Apply to arm every 14 days to test blood sugar continuously. DX: E11.65, Disp: 2 each, Rfl: 5   furosemide  (LASIX ) 40 MG tablet, Take 1 tablet (40 mg total) by mouth daily., Disp: 90 tablet, Rfl: 1   glucose blood (ACCU-CHEK GUIDE) test strip, Use as instructed, Disp: 300 each, Rfl: 12   ibuprofen (ADVIL,MOTRIN) 200 MG tablet, Take 400-600 mg by mouth every 6 (six) hours as needed. For pain/cramps, Disp: , Rfl:    insulin  aspart (NOVOLOG  FLEXPEN) 100 UNIT/ML FlexPen, INJECT 46 UNITS INTO THE SKIN 3 (THREE) TIMES DAILY WITH MEALS. **FILL WITH 8 BOXES, PT HAS 1, Disp: 135 mL, Rfl: 0   insulin  degludec (TRESIBA  FLEXTOUCH) 200 UNIT/ML FlexTouch Pen, Inject 180 Units into the skin daily., Disp: 90 mL, Rfl: 1   Insulin  Pen Needle (BD PEN NEEDLE MICRO ULTRAFINE) 32G X 6 MM MISC, Use with insulin  TID Dx E11.65, Disp: 300 each, Rfl: 5   JANUMET  XR 50-1000 MG TB24, TAKE 1 TABLET BY MOUTH EVERY DAY, Disp: 90 tablet, Rfl: 0   lisinopril  (ZESTRIL ) 20 MG tablet, Take 1 tablet (20 mg total) by mouth 2 (two) times daily., Disp: 180 tablet, Rfl: 1   metoprolol  succinate (TOPROL -XL) 100 MG 24 hr tablet, Take 1 tablet (100 mg total) by mouth daily. TAKE WITH OR IMMEDIATELY FOLLOWING A MEAL., Disp: 90 tablet, Rfl: 1   Multiple Vitamins-Minerals (AIRBORNE) CHEW, Chew 1 tablet by mouth as needed. , Disp: , Rfl:    nitroGLYCERIN  (NITROSTAT ) 0.4 MG SL tablet, Place 1 tablet (0.4 mg  total) under the tongue every 5 (five) minutes as needed for chest pain., Disp: 90 tablet, Rfl: 2   sulfamethoxazole -trimethoprim  (BACTRIM  DS) 800-160 MG tablet, Take 1 tablet by mouth 2 (two) times daily., Disp: 20 tablet, Rfl: 0   valACYclovir  (VALTREX ) 1000 MG tablet, Take 1 tablet (1,000 mg total) by mouth 3 (three) times daily., Disp: 21 tablet, Rfl: 0  Observations/Objective: Patient is well-developed, well-nourished in no acute distress.  Resting comfortably  at home.  Head is normocephalic, atraumatic.  No labored breathing.  Speech is clear and coherent with logical content.  Patient is alert and oriented at baseline.  No CVA tenderness  Assessment and Plan:  Emma Griffin in today with chief complaint of No chief complaint on file.   1. Dysuria  2. Flank pain -  Urinalysis, Routine w reflex microscopic - Urine Culture  3. Acute cystitis with hematuria (Primary) Take medication as prescribe Cotton underwear Take shower not bath Cranberry juice, yogurt Force fluids AZO over the counter X2 days Culture pending RTO prn  - sulfamethoxazole -trimethoprim  (BACTRIM  DS) 800-160 MG tablet; Take 1 tablet by mouth 2 (two) times daily.  Dispense: 14 tablet; Refill: 0 - fluconazole  (DIFLUCAN ) 150 MG tablet; 1 po q week x 4 weeks  Dispense: 4 tablet; Refill: 0    Follow Up Instructions: I discussed the assessment and treatment plan with the patient. The patient was provided an opportunity to ask questions and all were answered. The patient agreed with the plan and demonstrated an understanding of the instructions.  A copy of instructions were sent to the patient via MyChart.  The patient was advised to call back or seek an in-person evaluation if the symptoms worsen or if the condition fails to improve as anticipated.  Time:  I spent 6 minutes with the patient via telehealth technology discussing the above problems/concerns.    Mary-Margaret Gladis, [name]

## 2023-10-30 NOTE — Patient Instructions (Signed)
 Take medication as prescribe Cotton underwear Take shower not bath Cranberry juice, yogurt Force fluids AZO over the counter X2 days Culture pending RTO prn

## 2023-11-01 LAB — URINE CULTURE

## 2023-11-14 ENCOUNTER — Encounter (INDEPENDENT_AMBULATORY_CARE_PROVIDER_SITE_OTHER): Admitting: Ophthalmology

## 2023-11-14 DIAGNOSIS — D3132 Benign neoplasm of left choroid: Secondary | ICD-10-CM

## 2023-11-14 DIAGNOSIS — Z794 Long term (current) use of insulin: Secondary | ICD-10-CM | POA: Diagnosis not present

## 2023-11-14 DIAGNOSIS — I10 Essential (primary) hypertension: Secondary | ICD-10-CM | POA: Diagnosis not present

## 2023-11-14 DIAGNOSIS — H33102 Unspecified retinoschisis, left eye: Secondary | ICD-10-CM | POA: Diagnosis not present

## 2023-11-14 DIAGNOSIS — H35033 Hypertensive retinopathy, bilateral: Secondary | ICD-10-CM

## 2023-11-14 DIAGNOSIS — E113513 Type 2 diabetes mellitus with proliferative diabetic retinopathy with macular edema, bilateral: Secondary | ICD-10-CM

## 2023-11-14 DIAGNOSIS — Z7984 Long term (current) use of oral hypoglycemic drugs: Secondary | ICD-10-CM

## 2023-11-14 DIAGNOSIS — H43813 Vitreous degeneration, bilateral: Secondary | ICD-10-CM

## 2023-12-17 ENCOUNTER — Encounter (INDEPENDENT_AMBULATORY_CARE_PROVIDER_SITE_OTHER): Admitting: Ophthalmology

## 2023-12-17 DIAGNOSIS — Z7985 Long-term (current) use of injectable non-insulin antidiabetic drugs: Secondary | ICD-10-CM | POA: Diagnosis not present

## 2023-12-17 DIAGNOSIS — H35033 Hypertensive retinopathy, bilateral: Secondary | ICD-10-CM | POA: Diagnosis not present

## 2023-12-17 DIAGNOSIS — E113513 Type 2 diabetes mellitus with proliferative diabetic retinopathy with macular edema, bilateral: Secondary | ICD-10-CM | POA: Diagnosis not present

## 2023-12-17 DIAGNOSIS — I10 Essential (primary) hypertension: Secondary | ICD-10-CM

## 2023-12-17 DIAGNOSIS — H33102 Unspecified retinoschisis, left eye: Secondary | ICD-10-CM

## 2023-12-17 DIAGNOSIS — H43813 Vitreous degeneration, bilateral: Secondary | ICD-10-CM | POA: Diagnosis not present

## 2023-12-17 DIAGNOSIS — Z794 Long term (current) use of insulin: Secondary | ICD-10-CM

## 2023-12-17 DIAGNOSIS — D3132 Benign neoplasm of left choroid: Secondary | ICD-10-CM

## 2023-12-21 ENCOUNTER — Encounter: Payer: Self-pay | Admitting: Nurse Practitioner

## 2023-12-21 ENCOUNTER — Ambulatory Visit: Admitting: Nurse Practitioner

## 2023-12-21 VITALS — BP 164/74 | HR 68 | Temp 97.1°F | Ht 68.0 in | Wt 296.0 lb

## 2023-12-21 DIAGNOSIS — E1169 Type 2 diabetes mellitus with other specified complication: Secondary | ICD-10-CM

## 2023-12-21 DIAGNOSIS — B372 Candidiasis of skin and nail: Secondary | ICD-10-CM | POA: Diagnosis not present

## 2023-12-21 DIAGNOSIS — L89312 Pressure ulcer of right buttock, stage 2: Secondary | ICD-10-CM | POA: Diagnosis not present

## 2023-12-21 DIAGNOSIS — K219 Gastro-esophageal reflux disease without esophagitis: Secondary | ICD-10-CM

## 2023-12-21 DIAGNOSIS — I10 Essential (primary) hypertension: Secondary | ICD-10-CM | POA: Diagnosis not present

## 2023-12-21 DIAGNOSIS — E782 Mixed hyperlipidemia: Secondary | ICD-10-CM | POA: Diagnosis not present

## 2023-12-21 DIAGNOSIS — E785 Hyperlipidemia, unspecified: Secondary | ICD-10-CM | POA: Diagnosis not present

## 2023-12-21 DIAGNOSIS — R600 Localized edema: Secondary | ICD-10-CM | POA: Diagnosis not present

## 2023-12-21 DIAGNOSIS — E1165 Type 2 diabetes mellitus with hyperglycemia: Secondary | ICD-10-CM

## 2023-12-21 DIAGNOSIS — Z794 Long term (current) use of insulin: Secondary | ICD-10-CM

## 2023-12-21 LAB — CBC WITH DIFFERENTIAL/PLATELET
Basophils Absolute: 0 x10E3/uL (ref 0.0–0.2)
Basos: 0 %
EOS (ABSOLUTE): 0.3 x10E3/uL (ref 0.0–0.4)
Eos: 3 %
Hematocrit: 39.3 % (ref 34.0–46.6)
Hemoglobin: 12.4 g/dL (ref 11.1–15.9)
Immature Grans (Abs): 0 x10E3/uL (ref 0.0–0.1)
Immature Granulocytes: 0 %
Lymphocytes Absolute: 2.9 x10E3/uL (ref 0.7–3.1)
Lymphs: 32 %
MCH: 26.2 pg — ABNORMAL LOW (ref 26.6–33.0)
MCHC: 31.6 g/dL (ref 31.5–35.7)
MCV: 83 fL (ref 79–97)
Monocytes Absolute: 0.7 x10E3/uL (ref 0.1–0.9)
Monocytes: 8 %
Neutrophils Absolute: 5.1 x10E3/uL (ref 1.4–7.0)
Neutrophils: 57 %
Platelets: 192 x10E3/uL (ref 150–450)
RBC: 4.73 x10E6/uL (ref 3.77–5.28)
RDW: 14.8 % (ref 11.7–15.4)
WBC: 9.1 x10E3/uL (ref 3.4–10.8)

## 2023-12-21 LAB — CMP14+EGFR
ALT: 41 IU/L — ABNORMAL HIGH (ref 0–32)
AST: 40 IU/L (ref 0–40)
Albumin: 3.5 g/dL — ABNORMAL LOW (ref 3.8–4.8)
Alkaline Phosphatase: 130 IU/L (ref 49–135)
BUN/Creatinine Ratio: 16 (ref 12–28)
BUN: 16 mg/dL (ref 8–27)
Bilirubin Total: 0.4 mg/dL (ref 0.0–1.2)
CO2: 24 mmol/L (ref 20–29)
Calcium: 8.9 mg/dL (ref 8.7–10.3)
Chloride: 99 mmol/L (ref 96–106)
Creatinine, Ser: 0.98 mg/dL (ref 0.57–1.00)
Globulin, Total: 3.5 g/dL (ref 1.5–4.5)
Glucose: 269 mg/dL — ABNORMAL HIGH (ref 70–99)
Potassium: 3.7 mmol/L (ref 3.5–5.2)
Sodium: 137 mmol/L (ref 134–144)
Total Protein: 7 g/dL (ref 6.0–8.5)
eGFR: 61 mL/min/1.73 (ref >59)

## 2023-12-21 LAB — LIPID PANEL
Chol/HDL Ratio: 4.1 ratio (ref 0.0–4.4)
Cholesterol, Total: 112 mg/dL (ref 100–199)
HDL: 27 mg/dL — ABNORMAL LOW (ref >39)
LDL Chol Calc (NIH): 48 mg/dL (ref 0–99)
Triglycerides: 228 mg/dL — ABNORMAL HIGH (ref 0–149)
VLDL Cholesterol Cal: 37 mg/dL (ref 5–40)

## 2023-12-21 LAB — BAYER DCA HB A1C WAIVED: HB A1C (BAYER DCA - WAIVED): 9.5 % — ABNORMAL HIGH (ref 4.8–5.6)

## 2023-12-21 MED ORDER — FUROSEMIDE 40 MG PO TABS: 40.0000 mg | ORAL_TABLET | Freq: Every day | ORAL | 1 refills | Status: AC

## 2023-12-21 MED ORDER — NYSTATIN 100000 UNIT/GM EX CREA: 1.0000 | TOPICAL_CREAM | Freq: Two times a day (BID) | CUTANEOUS | 3 refills | Status: AC

## 2023-12-21 MED ORDER — ATORVASTATIN CALCIUM 80 MG PO TABS: 80.0000 mg | ORAL_TABLET | Freq: Every day | ORAL | 1 refills | Status: AC

## 2023-12-21 MED ORDER — NOVOLOG FLEXPEN 100 UNIT/ML ~~LOC~~ SOPN: 50.0000 [IU] | PEN_INJECTOR | Freq: Three times a day (TID) | SUBCUTANEOUS | 5 refills | Status: AC

## 2023-12-21 MED ORDER — TRESIBA FLEXTOUCH 200 UNIT/ML ~~LOC~~ SOPN: 180.0000 [IU] | PEN_INJECTOR | Freq: Every day | SUBCUTANEOUS | 1 refills | Status: AC

## 2023-12-21 MED ORDER — JANUMET XR 50-1000 MG PO TB24: 1.0000 | ORAL_TABLET | Freq: Every day | ORAL | 0 refills | Status: DC

## 2023-12-21 MED ORDER — FREESTYLE LIBRE 3 SENSOR MISC: 5 refills | Status: AC

## 2023-12-21 MED ORDER — METOPROLOL SUCCINATE ER 100 MG PO TB24: 100.0000 mg | ORAL_TABLET | Freq: Every day | ORAL | 1 refills | Status: AC

## 2023-12-21 NOTE — Patient Instructions (Signed)
 Pressure Injury  A pressure injury, also called a pressure ulcer or bedsore, is an injury to skin and the tissue under the skin that is caused by pressure. It often affects people who must spend a long time in a bed or chair because of a medical condition. Pressure injuries often occur: Over bony parts of the body, such as the tailbone, shoulders, elbows, hips, heels, spine, ankles, and back of the head. Under medical devices that touch the body. These include stockings, equipment to help with breathing, tubes, and splints. Inside the mouth or nose from dentures or tubes. Pressure injuries start as red areas on the skin and can lead to pain and an open wound. What are the causes? This condition is caused by frequent or constant pressure to an area of the body. Less blood flow to the skin can make the tissue die and break down over time, causing a wound. What increases the risk? You are more likely to develop this condition if: You are in the hospital or an extended care facility. You are bedridden or in a wheelchair. You have an injury or disease that keeps you from moving well and feeling pain or pressure. You have a condition that: Makes you sleepy or less alert. Causes poor blood flow. You need to wear a medical device. You have poor control of your bladder or bowel movements (incontinence). You are not getting enough fluid or nutrients (malnutrition). Your health care provider may recommend certain types of mattresses, mattress covers, pillows, cushions, or boots to help prevent a pressure injury. These may include products filled with air, foam, gel, or sand. What are the signs or symptoms? Symptoms of this condition depend on how severe your injury is. Symptoms may include: Red or dark areas of the skin. Pain or a change in skin texture. Your skin may feel warmer, cooler, softer, or firmer. Blisters. An open wound. How is this diagnosed? This condition is diagnosed based on a  medical history and physical exam. You may also have tests, such as: Blood tests. Imaging tests. Blood flow tests. Your injury will be staged based on how severe it is. Staging is based on: How deep the tissue injury is. This includes whether muscle, bone, tendon, or dead tissue is exposed. The cause of the injury. How is this treated? This condition may be treated by: Reducing pressure on your skin. You may need to: Change your position often. Avoid positions that caused the wound or that may make the wound worse. Use certain mattresses, overlays, chair cushions, or protective boots. Move medical devices from an area of pressure, or place padding between the skin and the device. Use foams, creams, or powders to protect your skin from sweat, urine, and stool and reduce rubbing (friction) on the skin. Keeping your skin clean and dry. This may include using a skin cleanser or barrier as told by your health care provider. Cleaning your injury and getting rid of any dead tissue from the wound (debridement). Placing a protective medicine, such as a cream, or bandage (dressing) over your injury. Using medicines for pain or to prevent or treat infection. Surgery may be needed if other treatments are not working or if your injury is very deep. Follow these instructions at home: Medicines Take over-the-counter and prescription medicines only as told by your health care provider. If you were prescribed antibiotics, take or apply them as told by your health care provider. Do not stop using the antibiotic even if you start to  feel better. Eating and drinking Drink enough fluid to keep your urine pale yellow. Eat a healthy diet with lots of protein, as told by your health care provider. Do not use drugs or drink alcohol. Wound care Follow instructions from your health care provider about how to take care of your wound. Make sure you: Wash your hands with soap and water before and after you change  your dressing or apply medicine to your skin. If soap and water are not available, use hand sanitizer. Change your dressing as told by your health care provider. Check your wound every day for signs of infection. Have a caregiver do this for you if you are not able. Check for: Redness, swelling, or more pain. More fluid or blood. Warmth. Pus or a bad smell. Skin care Keep your skin clean and dry. Gently pat your skin dry. Do not rub or massage your skin. Check your skin every day for any changes in color or any new blisters or sores (ulcers). Reducing pressure Do not lie or sit in one position for a long time. Move or change position every 1-2 hours, or as told by your health care provider. Use pillows or cushions to reduce pressure. Ask your health care provider what cushions or pads you should use. General instructions Do not use any products that contain nicotine or tobacco. These products include cigarettes, chewing tobacco, and vaping devices, such as e-cigarettes. If you need help quitting, ask your health care provider. Try to be active every day. Ask your health care provider what exercises or activities are safe for you. Keep all follow-up visits. Your health care provider will check if your injury is healing. Contact a health care provider if: You have a fever or chills. You have pain that does not get better with medicine. Your skin changes color. You have new blisters or sores. You have signs of infection. Your wound does not get better after 1-2 weeks of treatment. This information is not intended to replace advice given to you by your health care provider. Make sure you discuss any questions you have with your health care provider. Document Revised: 08/16/2021 Document Reviewed: 07/22/2021 Elsevier Patient Education  2024 [employer].

## 2023-12-21 NOTE — Progress Notes (Signed)
 Subjective:    Patient ID: Emma Griffin, female    DOB: October 04, 1950, 73 y.o.   MRN: [id number]   Chief Complaint: medical management of chronic issues     HPI:  Emma Griffin is a 73 y.o. who identifies as a female who was assigned female at birth.   Social history: Lives with: son and daughter  in law Work history: disability   Comes in today for follow up of the following chronic medical issues:  1. Primary hypertension No c/o chest pain, sob or headache. Does not check blood pressure at home. BP Readings from Last 3 Encounters:  09/20/23 138/68  06/28/23 (!) 172/82  03/23/23 (!) 163/79     2. Hyperlipidemia associated with type 2 diabetes mellitus (HCC) Does not watch diet and does no exercise. Lab Results  Component Value Date   CHOL 122 09/20/2023   HDL 27 (L) 09/20/2023   LDLCALC 53 09/20/2023   TRIG 264 (H) 09/20/2023   CHOLHDL 4.5 (H) 09/20/2023      3. Type 2 diabetes mellitus with hyperglycemia, with long-term current use of insulin  [facility]) Patient finely agreed on CGM and she loves it. Her blood sugars are running around 160-300. At last visit we increased her novalog to 50u with meals. Encouraged to do nightly novolog  dosing.  Not watching diet Lab Results  Component Value Date   HGBA1C 10.8 (H) 09/20/2023     4. Gastroesophageal reflux disease without esophagitis Just uses OTC meds as needed  5. Peripheral edema Has daily edema  6. Morbid obesity (HCC) Weight is stable  Wt Readings from Last 3 Encounters:  09/20/23 292 lb (132.5 kg)  06/28/23 291 lb (132 kg)  03/23/23 286 lb (129.7 kg)   BMI Readings from Last 3 Encounters:  09/20/23 44.40 kg/m  06/28/23 44.25 kg/m  03/23/23 43.49 kg/m       New complaints: Has pressure sore right buttocks. Is tender to touch. Redness has improved. She has been sitting on donut. Was draining for several days but not now.  Allergies  Allergen Reactions   Amlodipine Other (See Comments)     Other reaction(s): Other edema edema   Ozempic  (0.25 Or 0.5 Mg-Dose) [Semaglutide (0.25 Or 0.5mg -Dos)] Nausea Only    Severe nausea   Outpatient Encounter Medications as of 12/21/2023  Medication Sig   Accu-Chek Softclix Lancets lancets TEST 4 TIMES DAILY Dx E11.6   Ascorbic Acid (VITAMIN C) 1000 MG tablet Take 1,000 mg by mouth daily.   ASPIRIN  LOW DOSE 81 MG chewable tablet CHEW 1 TABLET BY MOUTH EVERY DAY   atorvastatin  (LIPITOR) 80 MG tablet Take 1 tablet (80 mg total) by mouth daily.   bevacizumab  (AVASTIN ) 1.25 mg/0.1 mL SOLN Apply 1.25 mg to eye See admin instructions. Opthalmic injection every 4-6 weeks   Blood Glucose Monitoring Suppl DEVI 1 each by Does not apply route in the morning, at noon, and at bedtime. May substitute to any manufacturer covered by patient's insurance.   Calcium  Carbonate-Vitamin D ([employer] CALCIUM /VIT D SOFT CHEWS PO) Take 1 tablet by mouth daily.    Continuous Glucose Receiver (FREESTYLE LIBRE 3 READER) DEVI 1 each by Does not apply route daily. Use to test blood sugar continuously. DX: E11.65   Continuous Glucose Sensor (FREESTYLE LIBRE 3 SENSOR) MISC Apply to arm every 14 days to test blood sugar continuously. DX: E11.65   fluconazole  (DIFLUCAN ) 150 MG tablet 1 po q week x 4 weeks   furosemide  (LASIX ) 40 MG tablet Take 1 tablet (  40 mg total) by mouth daily.   glucose blood (ACCU-CHEK GUIDE) test strip Use as instructed   ibuprofen (ADVIL,MOTRIN) 200 MG tablet Take 400-600 mg by mouth every 6 (six) hours as needed. For pain/cramps   insulin  aspart (NOVOLOG  FLEXPEN) 100 UNIT/ML FlexPen INJECT 46 UNITS INTO THE SKIN 3 (THREE) TIMES DAILY WITH MEALS. **FILL WITH 8 BOXES, PT HAS 1   insulin  degludec (TRESIBA  FLEXTOUCH) 200 UNIT/ML FlexTouch Pen Inject 180 Units into the skin daily.   Insulin  Pen Needle (BD PEN NEEDLE MICRO ULTRAFINE) 32G X 6 MM MISC Use with insulin  TID Dx E11.65   JANUMET  XR 50-1000 MG TB24 TAKE 1 TABLET BY MOUTH EVERY DAY   lisinopril  (ZESTRIL )  20 MG tablet Take 1 tablet (20 mg total) by mouth 2 (two) times daily.   metoprolol  succinate (TOPROL -XL) 100 MG 24 hr tablet Take 1 tablet (100 mg total) by mouth daily. TAKE WITH OR IMMEDIATELY FOLLOWING A MEAL.   Multiple Vitamins-Minerals (AIRBORNE) CHEW Chew 1 tablet by mouth as needed.    nitroGLYCERIN  (NITROSTAT ) 0.4 MG SL tablet Place 1 tablet (0.4 mg total) under the tongue every 5 (five) minutes as needed for chest pain.   sulfamethoxazole -trimethoprim  (BACTRIM  DS) 800-160 MG tablet Take 1 tablet by mouth 2 (two) times daily.   sulfamethoxazole -trimethoprim  (BACTRIM  DS) 800-160 MG tablet Take 1 tablet by mouth 2 (two) times daily.   valACYclovir  (VALTREX ) 1000 MG tablet Take 1 tablet (1,000 mg total) by mouth 3 (three) times daily.   No facility-administered encounter medications on file as of 12/21/2023.    Past Surgical History:  Procedure Laterality Date   CESAREAN SECTION  1987   x 1   EYE SURGERY     weak muscle eye surgery as child   HYSTEROSCOPY WITH D & C  09/20/2011   Procedure: DILATATION AND CURETTAGE /HYSTEROSCOPY;  Surgeon: Kemya Just, DO;  Location: [facility];  Service: Gynecology;  Laterality: N/A;  colposcopy   LITHOTRIPSY     kidney stone   REFRACTIVE SURGERY     broken blood vessels behind eyes - bilaterally   right side wisdom teeth ext     still has left wisdom teeth top/botom   TOOTH EXTRACTION     upper and lower back right    Family History  Problem Relation Age of Onset   Diabetes Mother    Hypertension Mother    Atrial fibrillation Mother    Heart attack Father    Diabetes Sister       Controlled substance contract: n/a     Review of Systems  Constitutional:  Negative for diaphoresis.  Eyes:  Negative for pain.  Respiratory:  Negative for shortness of breath.   Cardiovascular:  Negative for chest pain, palpitations and leg swelling.  Gastrointestinal:  Negative for abdominal pain.  Endocrine: Negative for polydipsia.  Skin:   Negative for rash.  Neurological:  Negative for dizziness, weakness and headaches.  Hematological:  Does not bruise/bleed easily.  All other systems reviewed and are negative.      Objective:   Physical Exam Vitals and nursing note reviewed.  Constitutional:      General: She is not in acute distress.    Appearance: Normal appearance. She is well-developed.  HENT:     Head: Normocephalic.     Right Ear: Tympanic membrane normal.     Left Ear: Tympanic membrane normal.     Nose: Nose normal.     Mouth/Throat:     Mouth: Mucous membranes  are moist.  Eyes:     Pupils: Pupils are equal, round, and reactive to light.  Neck:     Vascular: No carotid bruit or JVD.  Cardiovascular:     Rate and Rhythm: Normal rate and regular rhythm.     Heart sounds: Normal heart sounds.  Pulmonary:     Effort: Pulmonary effort is normal. No respiratory distress.     Breath sounds: Normal breath sounds. No wheezing or rales.  Chest:     Chest wall: No tenderness.  Abdominal:     General: Bowel sounds are normal. There is no distension or abdominal bruit.     Palpations: Abdomen is soft. There is no hepatomegaly, splenomegaly, mass or pulsatile mass.     Tenderness: There is no abdominal tenderness.  Musculoskeletal:        General: Normal range of motion.     Cervical back: Normal range of motion and neck supple.  Lymphadenopathy:     Cervical: No cervical adenopathy.  Skin:    General: Skin is warm and dry.     Comments: 3cm soft slightly tender lesion right buttocks. Erythematous moist rash bil groin area    Neurological:     Mental Status: She is alert and oriented to person, place, and time.     Deep Tendon Reflexes: Reflexes are normal and symmetric.  Psychiatric:        Behavior: Behavior normal.        Thought Content: Thought content normal.        Judgment: Judgment normal.     BP (!) 164/74   Pulse 68   Temp (!) 97.1 F (36.2 C) (Temporal)   Ht 5' 8 (1.727 m)   Wt 296  lb (134.3 kg)   SpO2 90%   BMI 45.01 kg/m      Hgba1c 9.5%       Assessment & Plan:  Pernie Grosso comes in today with chief complaint of medical management of chronic issues    Diagnosis and orders addressed:  1. Primary hypertension Low sodium diet - CBC with Differential/Platelet - CMP14+EGFR - Lipid panel - Bayer DCA Hb A1c Waived - lisinopril  (ZESTRIL ) 20 MG tablet; Take 1 tablet (20 mg total) by mouth 2 (two) times daily.  Dispense: 180 tablet; Refill: 1 - metoprolol  succinate (TOPROL -XL) 100 MG 24 hr tablet; Take 1 tablet (100 mg total) by mouth daily. TAKE WITH OR IMMEDIATELY FOLLOWING A MEAL.  Dispense: 90 tablet; Refill: 1  2. Hyperlipidemia associated with type 2 diabetes mellitus (HCC) Low fat diet - CBC with Differential/Platelet - CMP14+EGFR - Lipid panel - Bayer DCA Hb A1c Waived - atorvastatin  (LIPITOR) 80 MG tablet; Take 1 tablet (80 mg total) by mouth daily.  Dispense: 90 tablet; Refill: 1  3. Type 2 diabetes mellitus with hyperglycemia, with long-term current use of insulin  (HCC) Stricter carb counting Continue novalog at 50u with meals Continue  tresiba  180 u Continue janumet  - CBC with Differential/Platelet - CMP14+EGFR - Lipid panel - Bayer DCA Hb A1c Waived - Continuous Glucose Receiver (FREESTYLE LIBRE 3 READER) DEVI; 1 each by Does not apply route daily.  Dispense: 1 each; Refill: 0 - Continuous Glucose Sensor (FREESTYLE LIBRE 3 SENSOR) MISC; 1 each by Does not apply route every 14 (fourteen) days.  Dispense: 2 each; Refill: 5 - insulin  degludec (TRESIBA  FLEXTOUCH) 200 UNIT/ML FlexTouch Pen; Inject 160 Units into the skin daily.  Dispense: 100 mL; Refill: 3 - insulin  aspart (NOVOLOG ) 100 UNIT/ML injection; Inject 46  Units into the skin 3 (three) times daily with meals.  Dispense: 10 mL; Refill: 11 - SitaGLIPtin -MetFORMIN  HCl (JANUMET  XR) 50-1000 MG TB24; Take 1 tablet by mouth daily.  Dispense: 90 tablet; Refill: 1 - C-peptide  4.  Gastroesophageal reflux disease without esophagitis Avoid spicy foods Do not eat 2 hours prior to bedtime   5. Peripheral edema Elevate legs when sitting - furosemide  (LASIX ) 40 MG tablet; Take 1 tablet (40 mg total) by mouth daily.  Dispense: 90 tablet; Refill: 1  6. Morbid obesity (HCC) Discussed diet and exercise for person with BMI >25 Will recheck weight in 3-6 months   7. Decubitus stage 2 right buttocks Continue to sit on donut Moist warm compresses Bactrim  ds 1 po BID for 10 days #20 0 refills  8. Cutaneous candidiasis Nystatin cream to affected area Keep area as dry as possible   Labs pending Health Maintenance reviewed Diet and exercise encouraged  Follow up plan: 3 months   Emma Gladis, [name]

## 2023-12-24 ENCOUNTER — Ambulatory Visit: Payer: Self-pay | Admitting: Nurse Practitioner

## 2023-12-31 ENCOUNTER — Telehealth: Payer: Self-pay | Admitting: Pharmacy Technician

## 2023-12-31 NOTE — Progress Notes (Signed)
   12/31/2023  Patient ID: Emma Griffin, female   DOB: June 25, 1950, 73 y.o.   MRN: [id number] Patient engaged with clinical pharmacist for management of diabetes on 07/14/2022. Outreach by [employer] technician was requested.   Outreached patient to discuss diabetes medication management. Left voicemail for patient to return my call at their convenience.    Kavontae Pritchard, CPhT [facility] Office: [phone] Email: [email or web address]

## 2024-01-02 ENCOUNTER — Telehealth: Payer: Self-pay | Admitting: Pharmacy Technician

## 2024-01-02 NOTE — Progress Notes (Signed)
   01/02/2024 Name: Emma Griffin MRN: [id number] DOB: 18-Sep-1950  Patient is appearing for a follow-up visit with the population health pharmacy technician. Last engaged with the clinical pharmacist to discuss diabetes on 07/14/2022. Contacted patient today to discuss diabetes.   Plan from last clinical pharmacist appointment:   [name] 3 CGM send to local pharmacy per patient request.  She stated her insurance would pay $0 at the local pharmacy as of 03/06/22.    Patient currently on insulin  and her A1c remains uncontrolled at 12.7% as of 07/13/22.  Patient could benefit from GLP1/GIP, however she had severe nausea when we tried Ozempic .  She remains on Janumet .  Mounjaro is not affordable and does not offer assistance.    Medication Adherence Barriers Identified:  Patient made recommended medication changes per plan: Yes Patient has seen PCP since last Pharmacist office visit. She informs she takes what [name], [name] prescribes for her diabetes. Patient is NOT interested in any follow up calls from the Pharmacist or the [name]. Patient informs she is satisfied with the care [name], [name] provides and she informs provider keeps a check on her A1C. Patient's last A1C was 9.5 on 12/21/2023. Access issues with any new medication or testing device: No Patient did inform that Tresiba , Janumet  and Novolog  are all affordable for her at this time. Per [name] fill history data, patient appears compliant to medication regimen. Novolog  last filled for quantity 135 or 90 day supply on 10/20, Tresiba  for quantity of  81ml or 90 day supply on 9/27 and 90 tablets of Janumet  on 10/10/2023.   Next clinical pharmacist appointment is scheduled for: N/A, patient would like to decline services with the Pharmacist and [facility] team.  [name], CPhT [facility] Office: [phone] Email: [name]

## 2024-01-21 ENCOUNTER — Encounter (INDEPENDENT_AMBULATORY_CARE_PROVIDER_SITE_OTHER): Admitting: Ophthalmology

## 2024-01-21 DIAGNOSIS — H43813 Vitreous degeneration, bilateral: Secondary | ICD-10-CM

## 2024-01-21 DIAGNOSIS — E113513 Type 2 diabetes mellitus with proliferative diabetic retinopathy with macular edema, bilateral: Secondary | ICD-10-CM

## 2024-01-21 DIAGNOSIS — D3132 Benign neoplasm of left choroid: Secondary | ICD-10-CM | POA: Diagnosis not present

## 2024-01-21 DIAGNOSIS — H33102 Unspecified retinoschisis, left eye: Secondary | ICD-10-CM

## 2024-01-21 DIAGNOSIS — Z794 Long term (current) use of insulin: Secondary | ICD-10-CM

## 2024-01-21 DIAGNOSIS — I10 Essential (primary) hypertension: Secondary | ICD-10-CM | POA: Diagnosis not present

## 2024-01-21 DIAGNOSIS — Z7985 Long-term (current) use of injectable non-insulin antidiabetic drugs: Secondary | ICD-10-CM | POA: Diagnosis not present

## 2024-01-21 DIAGNOSIS — H35033 Hypertensive retinopathy, bilateral: Secondary | ICD-10-CM | POA: Diagnosis not present

## 2024-02-06 NOTE — Progress Notes (Signed)
 Emma Griffin                                          MRN: [id number]   02/06/2024   The [facility] Quality Team Specialist reviewed this patient medical record for the purposes of chart review for care gap closure. The following were reviewed: chart review for care gap closure-glycemic status assessment.    [facility] Quality Team

## 2024-02-25 ENCOUNTER — Encounter (INDEPENDENT_AMBULATORY_CARE_PROVIDER_SITE_OTHER): Admitting: Ophthalmology

## 2024-02-25 DIAGNOSIS — E113513 Type 2 diabetes mellitus with proliferative diabetic retinopathy with macular edema, bilateral: Secondary | ICD-10-CM

## 2024-02-25 DIAGNOSIS — D3132 Benign neoplasm of left choroid: Secondary | ICD-10-CM | POA: Diagnosis not present

## 2024-02-25 DIAGNOSIS — I10 Essential (primary) hypertension: Secondary | ICD-10-CM

## 2024-02-25 DIAGNOSIS — Z7985 Long-term (current) use of injectable non-insulin antidiabetic drugs: Secondary | ICD-10-CM

## 2024-02-25 DIAGNOSIS — Z794 Long term (current) use of insulin: Secondary | ICD-10-CM

## 2024-02-25 DIAGNOSIS — H33102 Unspecified retinoschisis, left eye: Secondary | ICD-10-CM

## 2024-02-25 DIAGNOSIS — H35033 Hypertensive retinopathy, bilateral: Secondary | ICD-10-CM

## 2024-02-25 DIAGNOSIS — H43813 Vitreous degeneration, bilateral: Secondary | ICD-10-CM

## 2024-03-14 ENCOUNTER — Telehealth: Payer: Self-pay

## 2024-03-14 NOTE — Telephone Encounter (Signed)
 Copied from CRM [id number]. Topic: Appointments - Scheduling Inquiry for Clinic >> Mar 14, 2024 10:40 AM [facility] ORN wrote: Reason for CRM: Patient called to cancel appt for next week but wanted to send a message to see if medications could be refilled. Let her know appt was for chronic follow up so may be needed in order to get refills for some medications. She states she does not have transportation and does not want to come in and chance getting the flu. Would like to know if someone can call her back. Thank You    ----------------------------------------------------------------------- From previous Reason for Contact - Cancel/Reschedule: Patient/patient representative is calling to cancel or reschedule an appointment. Refer to attachments for appointment information.

## 2024-03-14 NOTE — Telephone Encounter (Signed)
 Please review and advise.

## 2024-03-20 ENCOUNTER — Ambulatory Visit: Payer: Self-pay | Admitting: Nurse Practitioner

## 2024-04-02 ENCOUNTER — Encounter (INDEPENDENT_AMBULATORY_CARE_PROVIDER_SITE_OTHER): Admitting: Ophthalmology

## 2024-04-03 ENCOUNTER — Other Ambulatory Visit: Payer: Self-pay | Admitting: Nurse Practitioner

## 2024-04-03 DIAGNOSIS — E1165 Type 2 diabetes mellitus with hyperglycemia: Secondary | ICD-10-CM

## 2024-04-04 ENCOUNTER — Encounter (INDEPENDENT_AMBULATORY_CARE_PROVIDER_SITE_OTHER): Admitting: Ophthalmology

## 2024-04-04 DIAGNOSIS — H35033 Hypertensive retinopathy, bilateral: Secondary | ICD-10-CM | POA: Diagnosis not present

## 2024-04-04 DIAGNOSIS — H33102 Unspecified retinoschisis, left eye: Secondary | ICD-10-CM

## 2024-04-04 DIAGNOSIS — E113513 Type 2 diabetes mellitus with proliferative diabetic retinopathy with macular edema, bilateral: Secondary | ICD-10-CM

## 2024-04-04 DIAGNOSIS — H43813 Vitreous degeneration, bilateral: Secondary | ICD-10-CM

## 2024-04-04 DIAGNOSIS — Z7985 Long-term (current) use of injectable non-insulin antidiabetic drugs: Secondary | ICD-10-CM | POA: Diagnosis not present

## 2024-04-04 DIAGNOSIS — I10 Essential (primary) hypertension: Secondary | ICD-10-CM

## 2024-04-04 DIAGNOSIS — Z794 Long term (current) use of insulin: Secondary | ICD-10-CM

## 2024-04-04 DIAGNOSIS — D3132 Benign neoplasm of left choroid: Secondary | ICD-10-CM | POA: Diagnosis not present

## 2024-04-07 ENCOUNTER — Encounter (INDEPENDENT_AMBULATORY_CARE_PROVIDER_SITE_OTHER): Admitting: Ophthalmology

## 2024-05-06 ENCOUNTER — Encounter (INDEPENDENT_AMBULATORY_CARE_PROVIDER_SITE_OTHER): Admitting: Ophthalmology

## 2024-06-10 ENCOUNTER — Ambulatory Visit: Admitting: Nurse Practitioner
